# Patient Record
Sex: Female | Born: 1965 | Race: White | Hispanic: No | Marital: Married | Smoking: Never smoker
Health system: Southern US, Community
[De-identification: ages and names within clinical notes are randomized; demographics above are authoritative.]

## PROBLEM LIST (undated history)

## (undated) DIAGNOSIS — G43909 Migraine, unspecified, not intractable, without status migrainosus: Secondary | ICD-10-CM

## (undated) DIAGNOSIS — E039 Hypothyroidism, unspecified: Secondary | ICD-10-CM

## (undated) DIAGNOSIS — I82409 Acute embolism and thrombosis of unspecified deep veins of unspecified lower extremity: Secondary | ICD-10-CM

## (undated) HISTORY — PX: ABDOMINAL HYSTERECTOMY: SHX81

## (undated) HISTORY — DX: Acute embolism and thrombosis of unspecified deep veins of unspecified lower extremity: I82.409

## (undated) HISTORY — DX: Migraine, unspecified, not intractable, without status migrainosus: G43.909

## (undated) HISTORY — PX: ACHILLES TENDON REPAIR: SUR1153

## (undated) HISTORY — PX: LASER ABLATION: SHX1947

---

## 1998-05-09 ENCOUNTER — Other Ambulatory Visit: Admission: RE | Admit: 1998-05-09 | Discharge: 1998-05-09 | Payer: Self-pay | Admitting: Obstetrics and Gynecology

## 1999-03-21 ENCOUNTER — Other Ambulatory Visit: Admission: RE | Admit: 1999-03-21 | Discharge: 1999-03-21 | Payer: Self-pay | Admitting: Obstetrics and Gynecology

## 2000-05-14 HISTORY — PX: ROUX-EN-Y GASTRIC BYPASS: SHX1104

## 2001-01-23 ENCOUNTER — Other Ambulatory Visit: Admission: RE | Admit: 2001-01-23 | Discharge: 2001-01-23 | Payer: Self-pay | Admitting: Obstetrics and Gynecology

## 2002-02-17 ENCOUNTER — Ambulatory Visit (HOSPITAL_COMMUNITY): Admission: RE | Admit: 2002-02-17 | Discharge: 2002-02-17 | Payer: Self-pay | Admitting: Orthopedic Surgery

## 2002-04-21 ENCOUNTER — Ambulatory Visit (HOSPITAL_BASED_OUTPATIENT_CLINIC_OR_DEPARTMENT_OTHER): Admission: RE | Admit: 2002-04-21 | Discharge: 2002-04-21 | Payer: Self-pay | Admitting: Orthopedic Surgery

## 2002-05-21 ENCOUNTER — Inpatient Hospital Stay (HOSPITAL_COMMUNITY): Admission: EM | Admit: 2002-05-21 | Discharge: 2002-05-22 | Payer: Self-pay | Admitting: Emergency Medicine

## 2002-05-22 ENCOUNTER — Encounter: Payer: Self-pay | Admitting: Internal Medicine

## 2003-04-22 ENCOUNTER — Other Ambulatory Visit: Admission: RE | Admit: 2003-04-22 | Discharge: 2003-04-22 | Payer: Self-pay | Admitting: Obstetrics and Gynecology

## 2003-10-07 ENCOUNTER — Ambulatory Visit (HOSPITAL_BASED_OUTPATIENT_CLINIC_OR_DEPARTMENT_OTHER): Admission: RE | Admit: 2003-10-07 | Discharge: 2003-10-07 | Payer: Self-pay | Admitting: Orthopedic Surgery

## 2004-01-21 ENCOUNTER — Encounter: Admission: RE | Admit: 2004-01-21 | Discharge: 2004-04-20 | Payer: Self-pay | Admitting: Surgery

## 2004-02-08 ENCOUNTER — Ambulatory Visit (HOSPITAL_COMMUNITY): Admission: RE | Admit: 2004-02-08 | Discharge: 2004-02-08 | Payer: Self-pay | Admitting: Surgery

## 2004-04-11 ENCOUNTER — Inpatient Hospital Stay (HOSPITAL_COMMUNITY): Admission: RE | Admit: 2004-04-11 | Discharge: 2004-04-13 | Payer: Self-pay | Admitting: Surgery

## 2004-05-22 ENCOUNTER — Encounter: Admission: RE | Admit: 2004-05-22 | Discharge: 2004-08-20 | Payer: Self-pay | Admitting: Surgery

## 2004-09-25 ENCOUNTER — Other Ambulatory Visit: Admission: RE | Admit: 2004-09-25 | Discharge: 2004-09-25 | Payer: Self-pay | Admitting: Obstetrics and Gynecology

## 2004-10-12 ENCOUNTER — Encounter: Admission: RE | Admit: 2004-10-12 | Discharge: 2005-01-10 | Payer: Self-pay | Admitting: Surgery

## 2006-12-12 ENCOUNTER — Inpatient Hospital Stay (HOSPITAL_COMMUNITY): Admission: AD | Admit: 2006-12-12 | Discharge: 2006-12-16 | Payer: Self-pay | Admitting: Obstetrics and Gynecology

## 2007-08-13 ENCOUNTER — Inpatient Hospital Stay (HOSPITAL_COMMUNITY): Admission: RE | Admit: 2007-08-13 | Discharge: 2007-08-15 | Payer: Self-pay | Admitting: Obstetrics and Gynecology

## 2007-08-13 ENCOUNTER — Encounter (INDEPENDENT_AMBULATORY_CARE_PROVIDER_SITE_OTHER): Payer: Self-pay | Admitting: Obstetrics and Gynecology

## 2009-05-26 ENCOUNTER — Ambulatory Visit (HOSPITAL_COMMUNITY): Admission: AD | Admit: 2009-05-26 | Discharge: 2009-05-27 | Payer: Self-pay | Admitting: Surgery

## 2010-07-30 LAB — CBC
HCT: 37.3 % (ref 36.0–46.0)
Hemoglobin: 12.7 g/dL (ref 12.0–15.0)
MCHC: 34.2 g/dL (ref 30.0–36.0)
MCV: 97.1 fL (ref 78.0–100.0)
Platelets: 302 10*3/uL (ref 150–400)
RBC: 3.84 MIL/uL — ABNORMAL LOW (ref 3.87–5.11)
RDW: 12.6 % (ref 11.5–15.5)
WBC: 5.6 10*3/uL (ref 4.0–10.5)

## 2010-09-26 NOTE — Op Note (Signed)
NAME:  Gina Conner, Gina Conner NO.:  [id number]   MEDICAL RECORD NO.:  [id number]          PATIENT TYPE:  AMB   LOCATION:  SDC                           FACILITY:  [facility]   PHYSICIAN:  Malva Limes, M.D.    DATE OF BIRTH:  Sep 02, 1965   DATE OF PROCEDURE:  08/13/2007  DATE OF DISCHARGE:                               OPERATIVE REPORT   PREOPERATIVE DIAGNOSES:  1. Symptomatic uterine prolapse.  2. Stress urinary continence.  3. Rectocele and cystocele.   POSTOPERATIVE DIAGNOSES:  1. Symptomatic uterine prolapse.  2. Stress urinary continence.  3. Rectocele and cystocele.   PROCEDURE:  1. Laparoscopic-assisted vaginal hysterectomy with bilateral salpingo-      oophorectomy.  2. Transvaginal tape urethral sling.  3. Cystoscopy.  4. Anterior posterior colporrhaphy.   SURGEON:  Malva Limes, M.D.   ASSISTANT:  Randye Lobo, M.D.   ANESTHESIA:  General endotracheal.   ANTIBIOTIC:  Ancef and Cipro.   SPECIMENS:  Uterus, cervix, fallopian tubes and ovaries sent to  pathology.   ESTIMATED BLOOD LOSS:  250 mL.   COMPLICATIONS:  None.   PROCEDURE:  The patient was taken to the operating room where general  anesthetic was administered without complications.  She was then placed  in the dorsal lithotomy position and draped in usual fashion for this  procedure.  A Hulka tenaculum was applied to the anterior cervical lip.  Attention was then turned to the umbilicus.  The umbilicus was injected  with 0.25% Marcaine.  A vertical skin incision was made.  The fascia was  grasped, entered with Mayos and a pursestring suture placed.  The  parietal peritoneum was then entered bluntly.  The scope was placed and  the abdomen insufflated with 3 meters of carbon dioxide.  The patient  was then placed in Trendelenburg.  The patient had normal appearing  abdominal cavity.  There was no evidence of any adhesions or  endometriosis.  At this point the left ureter was identified.   The  infundibulopelvic ligament on the left was grasped, cauterized and  transected.  The broad ligament was then cauterized and transected.  Once the uterine vessel was reached, the procedure was stopped on the  side.  A similar procedure was performed on the opposite side.  This  concluded the laparoscopic part of the procedure.  At this point the  team moved to the vagina.  The cervix was injected with 1% lidocaine  with epinephrine.  The posterior cul-de-sac was entered sharply.  The  uterosacral ligaments were bilaterally clamped, cut and ligated with 0  Monocryl suture.  The cervix was circumscribed.  The anterior cul-de-sac  was entered sharply.  The cardinal ligaments were serially clamped, cut  and ligated with 0 Monocryl suture.  The uterine vessels were  bilaterally clamped, cut and ligated with 0 Monocryl suture.  This  specimen was then removed.  All pedicles were checked and found to be  hemostatic.  At this point the posterior cuff was closed using 2-0  Vicryl in a running locking fashion.  At this point Dr. Edward Jolly took over  the case and that part will be dictated by her.  At the conclusion of  her procedure, the abdominal cavity was insufflated again.  Hemostasis  was found.  The instruments were removed.  Pneumoperitoneum released.  The fascia was closed with 0 Vicryl suture in a pursestring fashion.  Skin was closed with 4-0 Vicryl suture in interrupted fashion.  The  ports were closed with Dermabond.  The patient was extubated and taken  to the recovery room in stable condition.  Instrument and lap counts  correct x2.           ______________________________  Malva Limes, M.D.     MA/MEDQ  D:  08/13/2007  T:  08/13/2007  Job:  [id number]

## 2010-09-26 NOTE — Op Note (Signed)
NAME:  Gina Conner, Gina Conner             ACCOUNT NO.:  [id number]   MEDICAL RECORD NO.:  [id number]          PATIENT TYPE:  AMB   LOCATION:  SDC                           [facility]   PHYSICIAN:  Randye Lobo, M.D.   DATE OF BIRTH:  05/24/1965   DATE OF PROCEDURE:  08/13/2007  DATE OF DISCHARGE:                               OPERATIVE REPORT   PREOPERATIVE DIAGNOSES:  1. Incomplete uterovaginal prolapse.  2. Genuine stress incontinence.  3. Family history of ovarian cancer.   POSTOPERATIVE DIAGNOSES:  1. Incomplete uterovaginal prolapse.  2. Genuine stress incontinence.  3. Family history of ovarian cancer.   PROCEDURE:  1. Anterior and posterior colporrhaphy.  2. Tension-free vaginal tape suburethral sling.  3. Cystoscopy.   SURGEON:  Conley Simmonds, MD   ASSISTANT:  Malva Limes, MD   ANESTHESIA:  General endotracheal; local is 0.5% lidocaine with  epinephrine 1:200,000.   ESTIMATED BLOOD LOSS:  150 mL.   URINE OUTPUT:  100 mL.   TOTAL INTRAVENOUS FLUIDS FOR SURGICAL PROCEDURE:  1700 mL   COMPLICATIONS:  None.   INDICATIONS FOR PROCEDURE:  The patient is a 45 year old para 1  Caucasian female who presented to her primary gynecologist, Dr. Malva Limes, with a report of urinary incontinence with stressful  maneuvers.  At that time, Dr. Dareen Piano diagnosed the patient with a  cystocele and a rectocele.  He discussed with the patient her  uterovaginal prolapse and stress incontinence and sent her for further  evaluation by me.   The patient had urodynamic testing in the office which confirmed the  presence of genuine stress incontinence.  The patient was noted to have  a first-degree cystocele and a second-degree rectocele with first-degree  uterine prolapse.   A plan is now made to proceed with a laparoscopically assisted vaginal  hysterectomy and bilateral salpingo-oophorectomy by Dr. Dareen Piano.  The  patient has requested removal of her ovaries due to family  history of  ovarian cancer.  I will be performing an anterior and posterior  colporrhaphy along with a tension-free vaginal tape suburethral sling  and cystoscopy.  Risks, benefits, and alternatives have been reviewed  with the patient, who wishes to proceed.   FINDINGS:  The patient was noted to have a first-degree cystocele and a  second-degree rectocele.   Cystoscopy demonstrated the bladder to be normal throughout 360 degrees  including the bladder dome and trigone.  The ureters were noted to be  patent bilaterally.  There was no evidence of a foreign body in either  the bladder or the urethra.   SPECIMEN:  None.   PROCEDURE:  The patient was reidentified in the preoperative hold area.  She did receive ciprofloxacin IV for antibiotic prophylaxis.   In the operating room, general endotracheal anesthesia was induced and  the patient was placed in the dorsal lithotomy position.  The patient  was sterilely prepped and draped and Dr. Dareen Piano proceeded with a  laparoscopically assisted vaginal hysterectomy.  Please refer to this  dictation separately.   At the end of the hysterectomy, Dr. Dareen Piano closed the posterior  vaginal  cuff with a running-locked suture of 0 Vicryl.  The hysterectomy  pedicles were noted to be hemostatic.   I then performed the remainder of the surgery.   I placed a Foley catheter in the bladder and left it to gravity drainage  throughout the procedure.   I performed a McCall culdoplasty using 0 Vicryl suture.  The suture was  brought through the posterior vaginal cuff and into the cul-de-sac at  the 6 o'clock position.  The suture was then brought down through the  distal left uterosacral ligament, across the posterior cul-de-sac in a  pursestring fashion, through the distal right uterosacral ligament and  then out through the cul-de-sac and into the vagina again at the 6  o'clock position.   The anterior colporrhaphy was performed next.  Allis clamps  were used to  mark the vaginal mucosa along the anterior vaginal wall in the midline.  The mucosa was injected locally with 0.5% lidocaine with 1:200,000 of  epinephrine.  The vaginal mucosa was incised vertically with a  Metzenbaum scissors.  Sharp dissection was used to dissect the bladder  and subvaginal tissue overlying the vaginal mucosa.  The dissection was  carried back to the pubic rami anteriorly and down to the level of the  uterosacral ligaments posteriorly.  Hemostasis during the procedure was  achieved with monopolar cautery.   The TVT sling was performed next in a top-down fashion.  Small puncture-  type incisions were then created suprapubically 2 cm to the right and  left of the midline.  The abdominal needle passer was then placed  through the right suprapubic incision and out through the endopelvic  fascia at the level of the midurethra and lateral to it on the  ipsilateral side.  The same procedure that was performed on the right-  hand side was then repeated on the left-hand side with the other  abdominal needle passer.  The Foley catheter was removed and cystoscopy  was performed and the findings are as noted above.  The bladder was then  drained of cystoscopy fluid and the Foley catheter was replaced.  The  sling was attached to the abdominal needle passers and drawn up through  the suprapubic incisions.  The plastic sheaths were removed while a  Kelly clamp was placed between the urethra and the sling.  Everything  was noted to be in excellent position.  Excess sling was trimmed  suprapubically.   The anterior colporrhaphy was performed with a series of vertical  mattress sutures of 2-0 Vicryl.  The excess vaginal mucosa was trimmed  and the anterior vaginal wall was closed with a running-locked suture of  2-0 Vicryl.  The vaginal cuff was similarly closed with a running-locked  suture of 2-0 Vicryl.  The culdoplasty suture was tied at the very end  of the case,  at which time there was excellent elevation and support of  the vaginal cuff.   The posterior colporrhaphy was performed last.  Allis clamps were again  used to mark the midline of the posterior vaginal mucosa up to a level  of 2 cm below the culdoplasty suture.  The vaginal mucosa and perineal  body were injected locally with 0.5% lidocaine with 1:200,000 of  epinephrine.  A triangular wedge of epithelium was removed from the  perineal body and the posterior vaginal mucosa was incised vertically  with a Metzenbaum scissors.  Sharp dissection was used to be used to  dissect the perirectal fascia off of the  posterior vaginal mucosa  bilaterally.  Hemostasis was again created with monopolar cautery.  Again, a series of vertical mattress sutures were used to reduce the  rectocele.  However, at the top of the rectocele repair, a pursestring  suture of 2-0 Vicryl was placed to begin the posterior colporrhaphy.  The vertical mattress sutures began with 2-0 Vicryl, which then  transitioned to 0 Vicryl at the level of the mid vagina and continued to  the perineal body.  Excess vaginal mucosa was then trimmed and the  posterior vaginal mucosa was closed with a running-locked suture of 2-0  Vicryl.  At the level of the perineal body, two crowns sutures of 0  Vicryl were placed to give extra perineal support.  The suture was  continued along the full perineal body in a subcuticular fashion as for  an episiotomy and the knot was tied at the level of the hymen.   A plain gauze was placed in the vagina for packing.   The suprapubic incisions were closed with Dermabond.   Dr. Dareen Piano completed final laparoscopy for the procedure.  Again,  refer to this dictation separately.   This concluded the patient's procedure.  There were no complications.  All needle, instrument, and sponge counts were correct.  The patient was  escorted to the recovery room in stable and awake condition.      Randye Lobo, M.D.  Electronically Signed     BES/MEDQ  D:  08/13/2007  T:  08/13/2007  Job:  [id number]

## 2010-09-29 NOTE — Discharge Summary (Signed)
NAME:  Gina Conner, Gina Conner             ACCOUNT NO.:  [id number]   MEDICAL RECORD NO.:  [id number]          PATIENT TYPE:  INP   LOCATION:  9303                          [facility]ACILITY:  [facility]   PHYSICIAN:  Randye Lobo, M.D.   DATE OF BIRTH:  01/05/1966   DATE OF ADMISSION:  08/13/2007  DATE OF DISCHARGE:  08/15/2007                               DISCHARGE SUMMARY   ADMISSION DIAGNOSES:  1. Incomplete uterovaginal prolapse.  2. Genuine stress incontinence.  3. Family history of ovarian cancer.   DISCHARGE DIAGNOSES:  1. Incomplete uterovaginal prolapse.  2. Genuine stress incontinence.  3. Family history of ovarian cancer.   SIGNIFICANT OPERATIONS AND PROCEDURES:  The patient underwent a  laparoscopically assisted vaginal hysterectomy with bilateral salpingo-  oophorectomy under the direction of Dr. Malva Limes, and a tension-  free vaginal tape suburethral sling, cystoscopy, anterior and posterior  colporrhaphy along with a McCall culdoplasty under the direction of Dr.  Conley Simmonds, all on August 13, 2007 at the [facility] [place].   ADMISSION HISTORY AND PHYSICAL EXAMINATION:  The patient is a 45-year-  old Caucasian female, a private patient of Dr. Malva Limes, who was  noted to have evidence of incomplete uterovaginal prolapse during an  evaluation for urinary incontinence with stressful maneuvers.  The  patient has a history of a forceps delivery of a macrosomic baby.  Multichannel urodynamic testing in the office has confirmed the presence  of genuine stress incontinence.  The patient has requested removal of  tubes and ovaries at the time of her surgical procedure due to a history  of ovarian cancer in the family.   PAST MEDICAL HISTORY:  Significant for a deep venous thrombosis after  ankle surgery.   Physical exam demonstrates a first-degree cystocele and a second-degree  rectocele.  The uterus was noted to be small and nontender.  There was  no evidence of any  adnexal masses nor tenderness.   HOSPITAL COURSE:  The patient was admitted on August 13, 2007, at which  time she underwent a laparoscopically assisted vaginal hysterectomy with  bilateral salpingo-oophorectomy under the direction of Dr. Malva Limes, and a tension-free vaginal tape, midurethral sling,  cystoscopy, and anterior and posterior colporrhaphy, and a McCall  culdoplasty under the direction of Dr. Conley Simmonds.  The patient's  surgical procedure was uncomplicated.   Postoperatively, the patient was begun on Lovenox for DVT prophylaxis.  She received her first dose in the PACU.  Postoperatively, the patient  did have control of her pain with a morphine PCA.  By postoperative day  #1, the patient was converted over to oral pain medication.  She  received both Percocet and ibuprofen.   The patient began her voiding trials on postoperative day #1.  She had  voids between 75 and 300 mL and postvoid residuals of 600-800 mL.  Her  Foley catheter was therefore replaced on the evening of postoperative  day #1, and she began her voiding trials on postoperative day #2.   The patient tolerated a regular diet during her hospitalization.  She  was able to ambulate  independently.  The patient's discharge hemoglobin  was noted to be 12.1.  The final pathology report from the uterus,  tubes, and ovaries were pending at the time of her discharge.   The patient was found to be in good condition and ready for discharge on  postoperative day #2.   DISCHARGE INSTRUCTIONS:  1. Discharged to home.  2. The patient will take the following medications; Percocet 5 mg/325      mg 1-2 p.o. q.4-6 h. p.r.n. pain, ibuprofen 600 mg p.o. q.6 h.      p.r.n. pain, Colace 100 mg p.o. daily to b.i.d. p.r.n.      constipation, ciprofloxacin 250 mg p.o. b.i.d. x5 days, Lovenox 40      mg subcu daily for a total of 7 days postoperatively (including      postoperative in-hospital Lovenox therapy), and Phenergan  25 mg      p.o. q.6 h. p.r.n. nausea.  3. The patient will have decreased activity at home.  She will not do      any heavy lifting or have sexual activity for 12 weeks.  The      patient will not drive for 2 weeks.  She will not work for 6 weeks.  4. The patient will follow a regular diet.  5. The patient will follow up in the office in 3 days for a voiding      trial, as her catheter was left to gravity drainage at the time of      her discharge.  6. The patient will call if she experiences fever, nausea, and      vomiting, pain uncontrolled by her medication, active vaginal      bleeding, difficulty with her catheter, pain, swelling, or redness      in her extremities, or any other concern.      Randye Lobo, M.D.  Electronically Signed     BES/MEDQ  D:  09/03/2007  T:  09/04/2007  Job:  [id number]

## 2010-09-29 NOTE — Discharge Summary (Signed)
Gina Conner, Gina Conner             ACCOUNT NO.:  [id number]   MEDICAL RECORD NO.:  [id number]          PATIENT TYPE:  INP   LOCATION:  0469                         [facility]ACILITY:  [facility]   PHYSICIAN:  Sandria Bales. Ezzard Standing, M.D.  DATE OF BIRTH:  [birth year]   DATE OF ADMISSION:  04/11/2004  DATE OF DISCHARGE:  04/13/2004                                 DISCHARGE SUMMARY   DISCHARGE DIAGNOSES:  1.  Morbid obesity.  2.  Seasonal allergies.  3.  Chronic back and right ankle pain.  4.  History of deep venous thrombosis.   OPERATION PERFORMED:  The patient had a laparoscopic Roux-en-Y gastric  bypass on the 29th of November, 2005.   HISTORY OF ILLNESS:  [name], patient of Dr.  Neita Conner of [place], [place] who has been morbidly obese much of her adult  life.  She presents to our bariatric program.  She has been to our bariatric  preoperative evaluation which has included labs, nutritional consultation,  and psychiatric evaluation.  She presents with a height of 5 feet 8 inches,  weight of 321 pounds, an initial BMI of 48.5.   PAST MEDICAL HISTORY:  1.  Seasonal allergies.  2.  Chronic back pain.  3.  Underlying ankle pain in part due to right ankle injury.  4.  History of DVT from prior right ankle injury.   She presented to the operating room on the 29th of November, 2005 and  underwent a laparoscopic Roux-en-Y gastric bypass.  Postoperatively, she did  well.  Her hemoglobin was 13, hematocrit 39 on the first postoperative day.  Her white blood cell count was 9800.  She had an upper GI swallow which was  negative and Doppler exams of her lower extremities was negative.  By the  second postoperative day, she was afebrile.  She was ready for discharge.  Her white blood cell count was 8900.  I removed her drain and reviewed her  discharge instructions with her.   She was given Roxicet for pain.  Her activity was as tolerated.  She was to  follow the bariatric diet  which at this time was protein supplement drinks.  She will have appointments to see the nutritionist on the 6th of December,  2005 and see me on the 9th of December, 2005.  Her discharge condition was  good.      DHN/MEDQ  D:  07/03/2004  T:  07/03/2004  Job:  [id number]   cc:   Fara Chute  [address] [place]  [place] [address]  Fax: [phone]

## 2010-09-29 NOTE — Op Note (Signed)
NAME:  Gina Conner, Gina Conner             ACCOUNT NO.:  [id number]   MEDICAL RECORD NO.:  [id number]          PATIENT TYPE:  INP   LOCATION:  0001                         FACILITY:  [facility]   PHYSICIAN:  Thornton Park. Daphine Deutscher, MD  DATE OF BIRTH:  28-Jul-1965   DATE OF PROCEDURE:  04/11/2004  DATE OF DISCHARGE:                                 OPERATIVE REPORT   PREOPERATIVE DIAGNOSIS:  Gastric bypass for morbid obesity.   POSTOPERATIVE DIAGNOSIS:  Gastric bypass for morbid obesity.   PROCEDURE:  Upper endoscopy.   SURGEON:  Thornton Park. Daphine Deutscher, M.D.   DESCRIPTION OF PROCEDURE:  Gina Conner is a 45 year old female  undergoing laparoscopic Roux-Y gastric bypass.  With the patient in the  supine position, I came to the head of the table and removed the Ewald tube.  I passed the upper endoscope without difficulty and under direct vision  passed it down the esophagus.  The esophagogastric junction at the Z line  was at approximately 41 cm, and I entered into a nice narrow pouch.  The  pouch length was approximately 6 cm and ended in a very nice anastomosis.  You could see the outflow, and pictures were taken.  I insufflated, and Dr.  Ezzard Standing put this under water and found no evidence of a leak.  I then  decompressed the stomach and removed the upper endoscope.  The patient  seemed to tolerate this procedure well, and the remainder of the procedure  was performed by Dr. Ezzard Standing.     Matt   MBM/MEDQ  D:  04/11/2004  T:  04/11/2004  Job:  [id number]   cc:   Sandria Bales. Ezzard Standing, M.D.  [address], [address]  [place]  [place] [address]  Fax: [phone]

## 2010-09-29 NOTE — Op Note (Signed)
NAME:  Gina Conner, Gina Conner                         ACCOUNT NO.:  [id number]   MEDICAL RECORD NO.:  [id number]                   PATIENT TYPE:  AMB   LOCATION:  DSC                                  FACILITY:  [facility]   PHYSICIAN:  Katy Fitch. Naaman Plummer., M.D.          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   OPERATION:  Release of left first dorsal compartment with resection of  septum and tenolysis and synovectomy of extensor pollicis brevis tendon.   SURGEON:  Katy Fitch. Sypher, M.D.   ASSISTANT:  Jonni Sanger, P.A.   ANESTHESIA:  General by LMA.   ANESTHESIOLOGIST:  Burna Forts, M.D.   INDICATIONS FOR PROCEDURE:  Gina Conner is a well known patient followed  for more than five years for stenosing tenosynovitis and other upper  extremity orthopedic predicaments.  She is status post release of her right  first dorsal compartment with a good result.  During the spring of 2005, she  developed stenosing tenosynovitis of the left first dorsal compartment and  after a trial of splinting and steroid injection x 2, she has been unable to  resolve this predicament.  After informed consent, she is brought to the  operating room at this time for release of her left first dorsal  compartment.   PROCEDURE:  Gina Conner is brought to the operating room and placed on  supine position on the operating table.  Following induction of general  anesthesia by LMA, the left arm was prepped with Betadine solution and  sterilely draped.  Following application of a pneumatic tourniquet to the  proximal brachium, the arm was exsanguinated with an Esmarch bandage and the  tourniquet was inflated to 230 mmHg.  The  procedure commenced with a short  transverse incision over the palpably enlarged first dorsal compartment.  The subcutaneous tissues were carefully divided taking care to gently  identify and retract the radial sensory branches.  The compartment was  markedly thickened and calcified.  The compartment was incised  longitudinally revealing two slips of the adductor pollicis longus that were  moderately swollen.  A second dorsal compartment was identified and opened.  The extensor pollicis brevis had thick cartilaginous tissue growing proximal  to the compartment.  This was causing triggering of the extensor pollicis  brevis as it passed through the accessory compartment.  The septum between  the dorsal and palmar compartments was resected with scissors.  The tendons  were placed in their anatomic position.  There was still a sense of  triggering, therefore, a synovectomy of the extensor pollicis brevis was  accomplished with a rongeur.  Thereafter, the wound was irrigated.  The  tendons were replaced in  their anatomic position and the skin repaired with  intradermal 3-0 Prolene suture.  Steri-Strips were applied followed by a  soft dressing of Xeroflow, sterile gauze, and Ace wrap.   We will encourage Gina Conner to move her wrist immediately.  There were no  apparent complications.  For aftercare, she is given a prescription for  Darvocet N100, 1 p.o. q.4-6h. p.r.n. pain, 20 tablets without refill.                                               Katy Fitch Naaman Plummer., M.D.    RVS/MEDQ  D:  10/07/2003  T:  10/07/2003  Job:  [id number]

## 2010-09-29 NOTE — Op Note (Signed)
NAME:  Gina Conner, Gina Conner             ACCOUNT NO.:  [id number]   MEDICAL RECORD NO.:  [id number]          PATIENT TYPE:  INP   LOCATION:  0001                         FACILITY:  [facility]   PHYSICIAN:  Sandria Bales. Ezzard Standing, M.D.  DATE OF BIRTH:  Nov 08, 1965   DATE OF PROCEDURE:  04/10/2004  DATE OF DISCHARGE:                                 OPERATIVE REPORT   PREOPERATIVE DIAGNOSIS:  Morbid obesity with a BMI of 48.5.   POSTOPERATIVE DIAGNOSIS:  Morbid obesity with a body mass index of 48.5.   PROCEDURE:  Laparoscopic Roux-en-Y gastrojejunostomy.   SURGEON:  Sandria Bales. Ezzard Standing, M.D.   FIRST ASSISTANT:  Thornton Park. Daphine Deutscher, M.D.   ANESTHESIA:  General endotracheal.   ESTIMATED BLOOD LOSS:  Minimal.   INDICATIONS FOR PROCEDURE:  Ms. Minogue is a 45 year old white female who has  been overweight most of her adult life.  She has been through a bariatric  program. She has a weight of 321 pounds with a BMI of 48.5.  She understands  the indications and potential risks of bariatric surgery.  Potential risks  include, but not limited to bleeding, infection, bowel leak, DVT, long-term  nutritional consequences and the possibility of open surgery.  Preoperatively she was given antibiotics and subcutaneous Lovenox.  She had  PS stockings in place.   DESCRIPTION OF PROCEDURE:  Her abdomen was prepped with Betadine solution  and sterilely draped. I entered the abdomen with a 12 mm Opti-Vu Ethicon  trocar in the left upper quadrant.  I ended up putting in 7 trocars; 5  across the upper mid-abdomen, 4 of which were 12s and 1 was a 5.  I put a 10-  11 below and to the right of her umbilicus and then a 5 mm subxiphoid for  the liver retractor.   Abdominal exploration revealed the stomach, both lobes of the liver and the  bowel which could be seen, were all unremarkable.  I first turned my  attention to the small bowel.  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  I went 40 cm downstream and divided the small bowel with  a white load of the  45 Endo-GIA stapler.  I then marked the gastric limb with a Penrose drain,  counted 100 cm and did a side-to-side stapled enteroenterostomy. This was  again with the 45 white staple load of Ethicon staples.  I then closed the  enterotomy with 2 running #1 2-0 Vicryl sutures.  I then closed the  mesenteric defect with a 2-0 silk suture on a Lapra-Ty.  I then covered the  jejunojejunostomy with a Tisseel.   I then placed the patient in a reverse Trendelenburg position using a  Nathanson retractor in the upper abdomen to retract the left lobe of the  liver.  I then identified the esophagogastric junction and first developed  the angle of His along the left crus.  After doing this, I went down the  lesser curvature approximately 5 cm to where the second vessel that entered  the lesser curve of the stomach.  I went into the lesser sac in this  direction.  First I used the 45 blue load to fire across the stomach,  through the lesser curvature. Then I used 3 loads of the 60 mm trocar to  complete the stomach pouch. She did have a little bit of bleeding on the new  pouch side up towards the angle of His.  I used at least 3 staples along  this to stop the bleeding.   I then oversewed the gastric remnant with a locking 2-0 Vicryl suture using  Lapra-Tys on both ends.  I then placed Tisseel along the new gastric pouch  and upper edges of the gastric remnant using about 1 cc. I then did an  antecolic, antegastric anastomosis with the limb of the jejunum up to the  gastric wall.  I used Ewald to got into the stomach.  I sewed the jejunum to  the stomach using a running 2-0 Vicryl suture.  I then did the enterotomy  with a 45-Endo GIA-stapler which was a blue load, and then closed the  gastrojejunostomy with 2 running 2-0 Vicryl sutures.   I then closed the anterior layer of the gastrojejunostomy with a running 2-0  Vicryl suture.  I did pass the Ewald down through the  anastomosis to make  sure this was lined up well.  The Maricela Curet was removed.   Dr. Daphine Deutscher then came to the head of the bed and did an upper endoscopy while  I clamped the jejunum in the abdomen. I flooded the upper abdomen with  saline. When he endoscoped the patient, there was no evidence of any air  leak.  He identified the GE-junction at about 41 cm.  The gastrojejunostomy  at about 46-47 cm for about a 5-6 cm pouch. There was no bleeding from the  pouch, no evidence of air leak and again, the anastomosis was widely patent.  He withdrawn the endoscope, and he will dictate this portion of the  operation.   I then placed a blake drain in the upper abdomen up over the GE-junction.  I  sewed this in using a 3-0 nylon suture.  I used about another 2 cc of  Tisseel over the anterior surface of the gastrojejunostomy and the cut end  of the jejunum.  I then removed the [facility] retractor, I removed all the  trocars under direct visualization.  There was no bleeding at any trocar  site.  I closed the skin with a skin staple.  The patient tolerated the  procedure well. Each wound was dressed.   The patient was transferred to the recovery room in good condition.  Sponge  and needle counts were correct at the end of the case.     Pervis Hocking   DHN/MEDQ  D:  04/11/2004  T:  04/11/2004  Job:  [id number]   cc:   Malva Limes, M.D.  [address], [address]  [place]  [place] [address]  Fax: [phone]   Fara Chute  [address] [place]  [place] [address]  Fax: [phone]

## 2011-02-05 LAB — CBC
HCT: 40.7
Hemoglobin: 14.3
MCHC: 35.1
MCV: 96
Platelets: 352
RBC: 4.24
RDW: 11.4 — ABNORMAL LOW
WBC: 4.8

## 2011-02-05 LAB — PREGNANCY, URINE: Preg Test, Ur: NEGATIVE

## 2011-02-06 LAB — CBC
HCT: 34.7 — ABNORMAL LOW
Hemoglobin: 12.1
MCHC: 34.8
MCV: 97.2
Platelets: 293
RBC: 3.57 — ABNORMAL LOW
RDW: 11.3 — ABNORMAL LOW
WBC: 7.2

## 2011-02-26 LAB — RPR: RPR Ser Ql: NONREACTIVE

## 2011-02-26 LAB — CBC
HCT: 33.4 — ABNORMAL LOW
HCT: 39.7
HCT: 39.3
Hemoglobin: 11.7 — ABNORMAL LOW
Hemoglobin: 13.6
Hemoglobin: 13.6
MCHC: 34.2
MCHC: 35.1
MCHC: 34.5
MCV: 101 — ABNORMAL HIGH
MCV: 102 — ABNORMAL HIGH
MCV: 101.7 — ABNORMAL HIGH
Platelets: 182
Platelets: 185
Platelets: 186
RBC: 3.31 — ABNORMAL LOW
RBC: 3.89
RBC: 3.86 — ABNORMAL LOW
RDW: 12.1
RDW: 12.5
RDW: 12.1
WBC: 16.7 — ABNORMAL HIGH
WBC: 21.2 — ABNORMAL HIGH
WBC: 8.4

## 2011-02-26 LAB — APTT: aPTT: 33

## 2011-02-26 LAB — DIFFERENTIAL
Basophils Absolute: 0
Basophils Relative: 0
Eosinophils Absolute: 0
Eosinophils Relative: 0
Lymphocytes Relative: 10 — ABNORMAL LOW
Lymphs Abs: 2.1
Monocytes Absolute: 1.4 — ABNORMAL HIGH
Monocytes Relative: 7
Neutro Abs: 17.8 — ABNORMAL HIGH
Neutrophils Relative %: 84 — ABNORMAL HIGH

## 2011-02-26 LAB — PROTIME-INR
INR: 1
Prothrombin Time: 12.8

## 2011-02-26 LAB — CCBB MATERNAL DONOR DRAW

## 2015-01-03 ENCOUNTER — Other Ambulatory Visit: Payer: Self-pay | Admitting: Obstetrics & Gynecology

## 2015-01-04 LAB — CYTOLOGY - PAP

## 2017-01-17 ENCOUNTER — Ambulatory Visit: Payer: Self-pay | Admitting: Orthopedic Surgery

## 2017-01-28 NOTE — Patient Instructions (Signed)
MAXWELL MARTORANO  01/28/2017   Your procedure is scheduled on: 02-06-17  Report to [facility] Main  Entrance Report to Admitting at 2:30 PM   Call this number if you have problems the morning of surgery  [phone]   Remember: ONLY 1 PERSON MAY GO WITH YOU TO SHORT STAY TO GET  READY MORNING OF YOUR SURGERY.  Do not eat food or drink liquids :After Midnight. You may have a Clear Liquid Diet from Midnight until 11:00AM. After 11:00AM, nothing by mouth until after surgery.     CLEAR LIQUID DIET   Foods Allowed                                                                     Foods Excluded  Coffee and tea, regular and decaf                             liquids that you cannot  Plain Jell-O in any flavor                                             see through such as: Fruit ices (not with fruit pulp)                                     milk, soups, orange juice  Iced Popsicles                                    All solid food Carbonated beverages, regular and diet                                    Cranberry, grape and apple juices Sports drinks like Gatorade Lightly seasoned clear broth or consume(fat free) Sugar, honey syrup  Sample Menu Breakfast                                Lunch                                     Supper Cranberry juice                    Beef broth                            Chicken broth Jell-O                                     Grape juice  Apple juice Coffee or tea                        Jell-O                                      Popsicle                                                Coffee or tea                        Coffee or tea  _____________________________________________________________________     Take these medicines the morning of surgery with A SIP OF WATER: Levothyroxine (Synthroid)                                You may not have any metal on your body including hair pins and            piercings  Do not wear jewelry, make-up, lotions, powders or perfumes, deodorant             Do not wear nail polish.  Do not shave  48 hours prior to surgery.               Do not bring valuables to the hospital. [place].  Contacts, dentures or bridgework may not be worn into surgery.  Leave suitcase in the car. After surgery it may be brought to your room.                 Please read over the following fact sheets you were given: _____________________________________________________________________             [facility] - Preparing for Surgery Before surgery, you can play an important role.  Because skin is not sterile, your skin needs to be as free of germs as possible.  You can reduce the number of germs on your skin by washing with CHG (chlorahexidine gluconate) soap before surgery.  CHG is an antiseptic cleaner which kills germs and bonds with the skin to continue killing germs even after washing. Please DO NOT use if you have an allergy to CHG or antibacterial soaps.  If your skin becomes reddened/irritated stop using the CHG and inform your nurse when you arrive at Short Stay. Do not shave (including legs and underarms) for at least 48 hours prior to the first CHG shower.  You may shave your face/neck. Please follow these instructions carefully:  1.  Shower with CHG Soap the night before surgery and the  morning of Surgery.  2.  If you choose to wash your hair, wash your hair first as usual with your  normal  shampoo.  3.  After you shampoo, rinse your hair and body thoroughly to remove the  shampoo.                           4.  Use CHG as you would any other liquid soap.  You can apply chg directly  to  the skin and wash                       Gently with a scrungie or clean washcloth.  5.  Apply the CHG Soap to your body ONLY FROM THE NECK DOWN.   Do not use on face/ open                           Wound or open sores.  Avoid contact with eyes, ears mouth and genitals (private parts).                       Wash face,  Genitals (private parts) with your normal soap.             6.  Wash thoroughly, paying special attention to the area where your surgery  will be performed.  7.  Thoroughly rinse your body with warm water from the neck down.  8.  DO NOT shower/wash with your normal soap after using and rinsing off  the CHG Soap.                9.  Pat yourself dry with a clean towel.            10.  Wear clean pajamas.            11.  Place clean sheets on your bed the night of your first shower and do not  sleep with pets. Day of Surgery : Do not apply any lotions/deodorants the morning of surgery.  Please wear clean clothes to the hospital/surgery center.  FAILURE TO FOLLOW THESE INSTRUCTIONS MAY RESULT IN THE CANCELLATION OF YOUR SURGERY PATIENT SIGNATURE_________________________________  NURSE SIGNATURE__________________________________  ________________________________________________________________________   Adam Phenix  An incentive spirometer is a tool that can help keep your lungs clear and active. This tool measures how well you are filling your lungs with each breath. Taking long deep breaths may help reverse or decrease the chance of developing breathing (pulmonary) problems (especially infection) following:  A long period of time when you are unable to move or be active. BEFORE THE PROCEDURE   If the spirometer includes an indicator to show your best effort, your nurse or respiratory therapist will set it to a desired goal.  If possible, sit up straight or lean slightly forward. Try not to slouch.  Hold the incentive spirometer in an upright position. INSTRUCTIONS FOR USE  1. Sit on the edge of your bed if possible, or sit up as far as you can in bed or on a chair. 2. Hold the incentive spirometer in an upright position. 3. Breathe out normally. 4. Place the mouthpiece in your  mouth and seal your lips tightly around it. 5. Breathe in slowly and as deeply as possible, raising the piston or the ball toward the top of the column. 6. Hold your breath for 3-5 seconds or for as long as possible. Allow the piston or ball to fall to the bottom of the column. 7. Remove the mouthpiece from your mouth and breathe out normally. 8. Rest for a few seconds and repeat Steps 1 through 7 at least 10 times every 1-2 hours when you are awake. Take your time and take a few normal breaths between deep breaths. 9. The spirometer may include an indicator to show your best effort. Use the indicator as a goal to work toward during each repetition. 10. After each  set of 10 deep breaths, practice coughing to be sure your lungs are clear. If you have an incision (the cut made at the time of surgery), support your incision when coughing by placing a pillow or rolled up towels firmly against it. Once you are able to get out of bed, walk around indoors and cough well. You may stop using the incentive spirometer when instructed by your caregiver.  RISKS AND COMPLICATIONS  Take your time so you do not get dizzy or light-headed.  If you are in pain, you may need to take or ask for pain medication before doing incentive spirometry. It is harder to take a deep breath if you are having pain. AFTER USE  Rest and breathe slowly and easily.  It can be helpful to keep track of a log of your progress. Your caregiver can provide you with a simple table to help with this. If you are using the spirometer at home, follow these instructions: [place] IF:   You are having difficultly using the spirometer.  You have trouble using the spirometer as often as instructed.  Your pain medication is not giving enough relief while using the spirometer.  You develop fever of 100.5 F (38.1 C) or higher. SEEK IMMEDIATE MEDICAL CARE IF:   You cough up bloody sputum that had not been present before.  You  develop fever of 102 F (38.9 C) or greater.  You develop worsening pain at or near the incision site. MAKE SURE YOU:   Understand these instructions.  Will watch your condition.  Will get help right away if you are not doing well or get worse. Document Released: 09/10/2006 Document Revised: 07/23/2011 Document Reviewed: 11/11/2006 [facility] Patient Information 2014 [place], [place].   ________________________________________________________________________

## 2017-01-30 ENCOUNTER — Encounter (HOSPITAL_COMMUNITY)
Admission: RE | Admit: 2017-01-30 | Discharge: 2017-01-30 | Disposition: A | Discharge status: Home / Self Care | Payer: Commercial Managed Care - PPO | Source: Ambulatory Visit | Attending: Orthopedic Surgery | Admitting: Orthopedic Surgery

## 2017-01-30 ENCOUNTER — Encounter (HOSPITAL_COMMUNITY): Payer: Self-pay | Admitting: *Deleted

## 2017-01-30 DIAGNOSIS — M7061 Trochanteric bursitis, right hip: Secondary | ICD-10-CM | POA: Diagnosis not present

## 2017-01-30 DIAGNOSIS — Z01812 Encounter for preprocedural laboratory examination: Secondary | ICD-10-CM | POA: Insufficient documentation

## 2017-01-30 HISTORY — DX: Hypothyroidism, unspecified: E03.9

## 2017-01-30 LAB — CBC
HCT: 43.3 % (ref 36.0–46.0)
Hemoglobin: 14.9 g/dL (ref 12.0–15.0)
MCH: 32.5 pg (ref 26.0–34.0)
MCHC: 34.4 g/dL (ref 30.0–36.0)
MCV: 94.5 fL (ref 78.0–100.0)
Platelets: 328 10*3/uL (ref 150–400)
RBC: 4.58 MIL/uL (ref 3.87–5.11)
RDW: 12.5 % (ref 11.5–15.5)
WBC: 7.1 10*3/uL (ref 4.0–10.5)

## 2017-02-05 MED ORDER — DEXTROSE 5 % IV SOLN
3.0000 g | INTRAVENOUS | Status: AC
  Administered 2017-02-06: 3 g via INTRAVENOUS
  Filled 2017-02-05: qty 3000

## 2017-02-06 ENCOUNTER — Ambulatory Visit (HOSPITAL_COMMUNITY): Payer: Commercial Managed Care - PPO | Admitting: Anesthesiology

## 2017-02-06 ENCOUNTER — Encounter (HOSPITAL_COMMUNITY)
Admission: RE | Disposition: A | Discharge status: Home / Self Care | Payer: Self-pay | Source: Ambulatory Visit | Attending: Orthopedic Surgery

## 2017-02-06 ENCOUNTER — Observation Stay (HOSPITAL_COMMUNITY)
Admission: RE | Admit: 2017-02-06 | Discharge: 2017-02-07 | Disposition: A | Discharge status: Home / Self Care | Payer: Commercial Managed Care - PPO | Source: Ambulatory Visit | Attending: Orthopedic Surgery | Admitting: Orthopedic Surgery

## 2017-02-06 ENCOUNTER — Encounter (HOSPITAL_COMMUNITY): Payer: Self-pay | Admitting: Anesthesiology

## 2017-02-06 DIAGNOSIS — M24051 Loose body in right hip: Secondary | ICD-10-CM | POA: Insufficient documentation

## 2017-02-06 DIAGNOSIS — M7601 Gluteal tendinitis, right hip: Secondary | ICD-10-CM | POA: Diagnosis not present

## 2017-02-06 DIAGNOSIS — E039 Hypothyroidism, unspecified: Secondary | ICD-10-CM | POA: Insufficient documentation

## 2017-02-06 DIAGNOSIS — Z90710 Acquired absence of both cervix and uterus: Secondary | ICD-10-CM | POA: Diagnosis not present

## 2017-02-06 DIAGNOSIS — M7071 Other bursitis of hip, right hip: Secondary | ICD-10-CM | POA: Diagnosis not present

## 2017-02-06 DIAGNOSIS — Z7982 Long term (current) use of aspirin: Secondary | ICD-10-CM | POA: Diagnosis not present

## 2017-02-06 DIAGNOSIS — M67951 Unspecified disorder of synovium and tendon, right thigh: Secondary | ICD-10-CM | POA: Diagnosis present

## 2017-02-06 DIAGNOSIS — Z79899 Other long term (current) drug therapy: Secondary | ICD-10-CM | POA: Diagnosis not present

## 2017-02-06 DIAGNOSIS — Z9884 Bariatric surgery status: Secondary | ICD-10-CM | POA: Insufficient documentation

## 2017-02-06 DIAGNOSIS — M6798 Unspecified disorder of synovium and tendon, other site: Secondary | ICD-10-CM | POA: Diagnosis present

## 2017-02-06 HISTORY — PX: EXCISION/RELEASE BURSA HIP: SHX5014

## 2017-02-06 SURGERY — RELEASE, BURSA, TROCHANTERIC
Anesthesia: General | Site: Hip | Laterality: Right

## 2017-02-06 MED ORDER — FENTANYL CITRATE (PF) 250 MCG/5ML IJ SOLN
INTRAMUSCULAR | Status: AC
  Filled 2017-02-06: qty 5

## 2017-02-06 MED ORDER — PROPOFOL 10 MG/ML IV BOLUS
INTRAVENOUS | Status: AC
  Filled 2017-02-06: qty 20

## 2017-02-06 MED ORDER — ONDANSETRON HCL 4 MG/2ML IJ SOLN
INTRAMUSCULAR | Status: AC
  Filled 2017-02-06: qty 2

## 2017-02-06 MED ORDER — CEFAZOLIN SODIUM-DEXTROSE 2-4 GM/100ML-% IV SOLN
2.0000 g | Freq: Four times a day (QID) | INTRAVENOUS | Status: AC
  Administered 2017-02-06 – 2017-02-07 (×3): 2 g via INTRAVENOUS
  Filled 2017-02-06 (×3): qty 100

## 2017-02-06 MED ORDER — DEXAMETHASONE SODIUM PHOSPHATE 10 MG/ML IJ SOLN
10.0000 mg | Freq: Once | INTRAMUSCULAR | Status: AC
  Administered 2017-02-06: 10 mg via INTRAVENOUS

## 2017-02-06 MED ORDER — SUGAMMADEX SODIUM 500 MG/5ML IV SOLN
INTRAVENOUS | Status: AC
Start: 2017-02-06 — End: 2017-02-06
  Filled 2017-02-06: qty 5

## 2017-02-06 MED ORDER — PHENYLEPHRINE 40 MCG/ML (10ML) SYRINGE FOR IV PUSH (FOR BLOOD PRESSURE SUPPORT)
PREFILLED_SYRINGE | INTRAVENOUS | Status: DC | PRN
  Administered 2017-02-06: 80 ug via INTRAVENOUS

## 2017-02-06 MED ORDER — SODIUM CHLORIDE 0.9 % IJ SOLN
INTRAMUSCULAR | Status: DC | PRN
  Administered 2017-02-06: 30 mL

## 2017-02-06 MED ORDER — HYDROMORPHONE HCL 1 MG/ML IJ SOLN
INTRAMUSCULAR | Status: DC | PRN
  Administered 2017-02-06 (×2): 1 mg via INTRAVENOUS

## 2017-02-06 MED ORDER — ACETAMINOPHEN 10 MG/ML IV SOLN
1000.0000 mg | Freq: Once | INTRAVENOUS | Status: AC
  Administered 2017-02-06: 1000 mg via INTRAVENOUS

## 2017-02-06 MED ORDER — FENTANYL CITRATE (PF) 100 MCG/2ML IJ SOLN
INTRAMUSCULAR | Status: DC | PRN
  Administered 2017-02-06 (×3): 50 ug via INTRAVENOUS
  Administered 2017-02-06: 100 ug via INTRAVENOUS

## 2017-02-06 MED ORDER — BUPIVACAINE HCL 0.25 % IJ SOLN
INTRAMUSCULAR | Status: AC
  Filled 2017-02-06: qty 1

## 2017-02-06 MED ORDER — MIDAZOLAM HCL 5 MG/5ML IJ SOLN
INTRAMUSCULAR | Status: DC | PRN
  Administered 2017-02-06: 2 mg via INTRAVENOUS

## 2017-02-06 MED ORDER — METOCLOPRAMIDE HCL 5 MG PO TABS: 5.0000 mg | ORAL_TABLET | Freq: Three times a day (TID) | ORAL | Status: DC | PRN

## 2017-02-06 MED ORDER — CHLORHEXIDINE GLUCONATE 4 % EX LIQD: 60.0000 mL | Freq: Once | CUTANEOUS | Status: DC

## 2017-02-06 MED ORDER — ONDANSETRON HCL 4 MG/2ML IJ SOLN: 4.0000 mg | Freq: Once | INTRAMUSCULAR | Status: DC | PRN

## 2017-02-06 MED ORDER — BUPIVACAINE LIPOSOME 1.3 % IJ SUSP
INTRAMUSCULAR | Status: DC | PRN
  Administered 2017-02-06: 20 mL

## 2017-02-06 MED ORDER — SODIUM CHLORIDE 0.9 % IJ SOLN
INTRAMUSCULAR | Status: AC
  Filled 2017-02-06: qty 50

## 2017-02-06 MED ORDER — PROPOFOL 10 MG/ML IV BOLUS
INTRAVENOUS | Status: DC | PRN
  Administered 2017-02-06: 200 mg via INTRAVENOUS

## 2017-02-06 MED ORDER — LACTATED RINGERS IV SOLN
INTRAVENOUS | Status: DC
  Administered 2017-02-06: 15:00:00 via INTRAVENOUS

## 2017-02-06 MED ORDER — METOCLOPRAMIDE HCL 5 MG/ML IJ SOLN: 5.0000 mg | Freq: Three times a day (TID) | INTRAMUSCULAR | Status: DC | PRN

## 2017-02-06 MED ORDER — METHOCARBAMOL 500 MG PO TABS
500.0000 mg | ORAL_TABLET | Freq: Four times a day (QID) | ORAL | Status: DC | PRN
  Administered 2017-02-07: 500 mg via ORAL
  Filled 2017-02-06: qty 1

## 2017-02-06 MED ORDER — LEVOTHYROXINE SODIUM 75 MCG PO TABS
150.0000 ug | ORAL_TABLET | Freq: Every day | ORAL | Status: DC
  Administered 2017-02-07: 09:00:00 150 ug via ORAL
  Filled 2017-02-06: qty 2

## 2017-02-06 MED ORDER — BUPIVACAINE LIPOSOME 1.3 % IJ SUSP
20.0000 mL | Freq: Once | INTRAMUSCULAR | Status: DC
  Filled 2017-02-06: qty 20

## 2017-02-06 MED ORDER — MEPERIDINE HCL 50 MG/ML IJ SOLN: 6.2500 mg | INTRAMUSCULAR | Status: DC | PRN

## 2017-02-06 MED ORDER — HYDROMORPHONE HCL 2 MG/ML IJ SOLN
INTRAMUSCULAR | Status: AC
  Filled 2017-02-06: qty 1

## 2017-02-06 MED ORDER — POVIDONE-IODINE 10 % EX SWAB
2.0000 "application " | Freq: Once | CUTANEOUS | Status: AC
  Administered 2017-02-06: 2 via TOPICAL

## 2017-02-06 MED ORDER — LIDOCAINE 2% (20 MG/ML) 5 ML SYRINGE
INTRAMUSCULAR | Status: AC
Start: 2017-02-06 — End: 2017-02-06
  Filled 2017-02-06: qty 5

## 2017-02-06 MED ORDER — ROCURONIUM BROMIDE 50 MG/5ML IV SOSY
PREFILLED_SYRINGE | INTRAVENOUS | Status: AC
  Filled 2017-02-06: qty 5

## 2017-02-06 MED ORDER — ONDANSETRON HCL 4 MG PO TABS
4.0000 mg | ORAL_TABLET | Freq: Four times a day (QID) | ORAL | Status: DC | PRN
  Administered 2017-02-07 (×2): 4 mg via ORAL
  Filled 2017-02-06 (×2): qty 1

## 2017-02-06 MED ORDER — ROCURONIUM BROMIDE 10 MG/ML (PF) SYRINGE
PREFILLED_SYRINGE | INTRAVENOUS | Status: DC | PRN
  Administered 2017-02-06: 50 mg via INTRAVENOUS

## 2017-02-06 MED ORDER — SODIUM CHLORIDE 0.9 % IV SOLN
INTRAVENOUS | Status: DC
  Administered 2017-02-06: 21:00:00 via INTRAVENOUS

## 2017-02-06 MED ORDER — OXYCODONE HCL 5 MG PO TABS
5.0000 mg | ORAL_TABLET | ORAL | Status: DC | PRN
  Administered 2017-02-06: 5 mg via ORAL
  Administered 2017-02-07 (×2): 10 mg via ORAL
  Filled 2017-02-06 (×2): qty 2
  Filled 2017-02-06: qty 1

## 2017-02-06 MED ORDER — SUGAMMADEX SODIUM 200 MG/2ML IV SOLN
INTRAVENOUS | Status: DC | PRN
  Administered 2017-02-06: 260 mg via INTRAVENOUS

## 2017-02-06 MED ORDER — ACETAMINOPHEN 10 MG/ML IV SOLN
INTRAVENOUS | Status: AC
  Filled 2017-02-06: qty 100

## 2017-02-06 MED ORDER — ONDANSETRON HCL 4 MG/2ML IJ SOLN: 4.0000 mg | Freq: Four times a day (QID) | INTRAMUSCULAR | Status: DC | PRN

## 2017-02-06 MED ORDER — MIDAZOLAM HCL 2 MG/2ML IJ SOLN
INTRAMUSCULAR | Status: AC
  Filled 2017-02-06: qty 2

## 2017-02-06 MED ORDER — LIDOCAINE 2% (20 MG/ML) 5 ML SYRINGE
INTRAMUSCULAR | Status: DC | PRN
  Administered 2017-02-06: 50 mg via INTRAVENOUS

## 2017-02-06 MED ORDER — ENOXAPARIN SODIUM 40 MG/0.4ML ~~LOC~~ SOLN
40.0000 mg | SUBCUTANEOUS | Status: DC
  Administered 2017-02-07: 09:00:00 40 mg via SUBCUTANEOUS
  Filled 2017-02-06: qty 0.4

## 2017-02-06 MED ORDER — MORPHINE SULFATE (PF) 4 MG/ML IV SOLN: 1.0000 mg | INTRAVENOUS | Status: DC | PRN

## 2017-02-06 MED ORDER — HYDROMORPHONE HCL-NACL 0.5-0.9 MG/ML-% IV SOSY
0.2500 mg | PREFILLED_SYRINGE | INTRAVENOUS | Status: DC | PRN
  Administered 2017-02-06 (×4): 0.5 mg via INTRAVENOUS

## 2017-02-06 MED ORDER — DEXAMETHASONE SODIUM PHOSPHATE 10 MG/ML IJ SOLN
INTRAMUSCULAR | Status: AC
Start: 2017-02-06 — End: 2017-02-06
  Filled 2017-02-06: qty 1

## 2017-02-06 MED ORDER — METHOCARBAMOL 1000 MG/10ML IJ SOLN
500.0000 mg | Freq: Four times a day (QID) | INTRAVENOUS | Status: DC | PRN
  Administered 2017-02-06: 500 mg via INTRAVENOUS
  Filled 2017-02-06: qty 550

## 2017-02-06 MED ORDER — HYDROMORPHONE HCL-NACL 0.5-0.9 MG/ML-% IV SOSY
PREFILLED_SYRINGE | INTRAVENOUS | Status: AC
  Filled 2017-02-06: qty 4

## 2017-02-06 MED ORDER — ACETAMINOPHEN 500 MG PO TABS
1000.0000 mg | ORAL_TABLET | Freq: Four times a day (QID) | ORAL | Status: DC
  Administered 2017-02-06 – 2017-02-07 (×3): 1000 mg via ORAL
  Filled 2017-02-06 (×3): qty 2

## 2017-02-06 SURGICAL SUPPLY — 44 items
BAG SPEC THK2 15X12 ZIP CLS (MISCELLANEOUS)
BAG ZIPLOCK 12X15 (MISCELLANEOUS) IMPLANT
BIT DRILL 2.4X128 (BIT) IMPLANT
BLADE EXTENDED COATED 6.5IN (ELECTRODE) ×2 IMPLANT
COVER SURGICAL LIGHT HANDLE (MISCELLANEOUS) ×2 IMPLANT
DRAPE INCISE IOBAN 66X45 STRL (DRAPES) ×2 IMPLANT
DRAPE ORTHO SPLIT 77X108 STRL (DRAPES) ×4
DRAPE POUCH INSTRU U-SHP 10X18 (DRAPES) ×2 IMPLANT
DRAPE SURG ORHT 6 SPLT 77X108 (DRAPES) ×2 IMPLANT
DRAPE U-SHAPE 47X51 STRL (DRAPES) ×2 IMPLANT
DRSG ADAPTIC 3X8 NADH LF (GAUZE/BANDAGES/DRESSINGS) ×2 IMPLANT
DRSG MEPILEX BORDER 4X4 (GAUZE/BANDAGES/DRESSINGS) ×1 IMPLANT
DRSG MEPILEX BORDER 4X8 (GAUZE/BANDAGES/DRESSINGS) ×2 IMPLANT
DURAPREP 26ML APPLICATOR (WOUND CARE) ×2 IMPLANT
ELECT REM PT RETURN 15FT ADLT (MISCELLANEOUS) ×2 IMPLANT
EVACUATOR 1/8 PVC DRAIN (DRAIN) ×1 IMPLANT
GAUZE SPONGE 4X4 12PLY STRL (GAUZE/BANDAGES/DRESSINGS) ×2 IMPLANT
GLOVE BIO SURGEON STRL SZ7.5 (GLOVE) ×2 IMPLANT
GLOVE BIO SURGEON STRL SZ8 (GLOVE) ×2 IMPLANT
GLOVE BIOGEL PI IND STRL 8 (GLOVE) ×2 IMPLANT
GLOVE BIOGEL PI INDICATOR 8 (GLOVE) ×2
GOWN STRL REUS W/TWL LRG LVL3 (GOWN DISPOSABLE) ×2 IMPLANT
GOWN STRL REUS W/TWL XL LVL3 (GOWN DISPOSABLE) ×2 IMPLANT
KIT BASIN OR (CUSTOM PROCEDURE TRAY) ×2 IMPLANT
MANIFOLD NEPTUNE II (INSTRUMENTS) ×2 IMPLANT
NDL MA TROC 1/2 (NEEDLE) ×1 IMPLANT
NDL SAFETY ECLIPSE 18X1.5 (NEEDLE) ×2 IMPLANT
NEEDLE HYPO 18GX1.5 SHARP (NEEDLE) ×4
NEEDLE MA TROC 1/2 (NEEDLE) IMPLANT
NS IRRIG 1000ML POUR BTL (IV SOLUTION) ×2 IMPLANT
PACK TOTAL JOINT (CUSTOM PROCEDURE TRAY) ×2 IMPLANT
PASSER SUT SWANSON 36MM LOOP (INSTRUMENTS) ×1 IMPLANT
POSITIONER SURGICAL ARM (MISCELLANEOUS) ×2 IMPLANT
STRIP CLOSURE SKIN 1/2X4 (GAUZE/BANDAGES/DRESSINGS) ×3 IMPLANT
SUT ETHIBOND NAB CT1 #1 30IN (SUTURE) IMPLANT
SUT MNCRL AB 4-0 PS2 18 (SUTURE) ×2 IMPLANT
SUT VIC AB 1 CT1 27 (SUTURE) ×2
SUT VIC AB 1 CT1 27XBRD ANTBC (SUTURE) IMPLANT
SUT VIC AB 1 CT1 36 (SUTURE) ×1 IMPLANT
SUT VIC AB 2-0 CT1 27 (SUTURE) ×4
SUT VIC AB 2-0 CT1 TAPERPNT 27 (SUTURE) ×2 IMPLANT
SYR 20CC LL (SYRINGE) ×1 IMPLANT
SYR 50ML LL SCALE MARK (SYRINGE) ×3 IMPLANT
TOWEL OR 17X26 10 PK STRL BLUE (TOWEL DISPOSABLE) ×2 IMPLANT

## 2017-02-06 NOTE — Brief Op Note (Signed)
02/06/2017  5:13 PM  PATIENT:  Gina Conner  51 y.o. female  PRE-OPERATIVE DIAGNOSIS:  Right hip bursitis; gluteal tendon tear  POST-OPERATIVE DIAGNOSIS:  Right hip bursitis  PROCEDURE:  Procedure(s): Right hip bursectomy (Right)  SURGEON:  Surgeon(s) and Role:    Gaynelle Arabian, MD - Primary  PHYSICIAN ASSISTANT:   ASSISTANTS: Arlee Muslim, PA-C   ANESTHESIA:   general  EBL:  No intake/output data recorded.  BLOOD ADMINISTERED:none  DRAINS: (Medium) Hemovact drain(s) in the right hip with  Suction Open   LOCAL MEDICATIONS USED:  OTHER Exparel  COUNTS:  YES  TOURNIQUET:  * No tourniquets in log *  DICTATION: .Other Dictation: Dictation Number [id number]  PLAN OF CARE: Admit for overnight observation  PATIENT DISPOSITION:  PACU - hemodynamically stable.

## 2017-02-06 NOTE — Interval H&P Note (Signed)
History and Physical Interval Note:  02/06/2017 4:03 PM  Gina Conner  has presented today for surgery, with the diagnosis of Right hip bursitis; gluteal tendon tear  The various methods of treatment have been discussed with the patient and family. After consideration of risks, benefits and other options for treatment, the patient has consented to  Procedure(s): Right hip bursectomy; gluteal tendon repair (Right) as a surgical intervention .  The patient's history has been reviewed, patient examined, no change in status, stable for surgery.  I have reviewed the patient's chart and labs.  Questions were answered to the patient's satisfaction.     [name]

## 2017-02-06 NOTE — H&P (Signed)
Subjective:   Patient is a 51 y.o. female presented with a history of right lateral hip pain.  Onset of symptoms wasseveral months ago with gradually worsening course since that time. She has had cortisone injections and physical therapy without lasting benefit and MRI showed gluteal tendon tear. She presents today for surgical management of this condition.  There are no active problems to display for this patient.  Past Medical History:  Diagnosis Date  . Hypothyroidism     Past Surgical History:  Procedure Laterality Date  . ABDOMINAL HYSTERECTOMY    . ROUX-EN-Y GASTRIC BYPASS  2002    Prescriptions Prior to Admission  Medication Sig Dispense Refill Last Dose  . acetaminophen (TYLENOL) 500 MG tablet Take 1,000 mg by mouth every 8 (eight) hours as needed for mild pain or headache.   Past Month at Unknown time  . Cholecalciferol (D 5000) 5000 units capsule Take 5,000 Units by mouth daily.   02/05/2017 at 0530  . Iron, Ferrous Gluconate, 256 (28 Fe) MG TABS Take 1 tablet by mouth daily.   02/05/2017 at 0530  . levothyroxine (SYNTHROID, LEVOTHROID) 50 MCG tablet Take 150 mcg by mouth daily before breakfast.   02/06/2017 at 0530   No Known Allergies  Social History  Substance Use Topics  . Smoking status: Never Smoker  . Smokeless tobacco: Never Used  . Alcohol use No     Comment: occ    History reviewed. No pertinent family history.  Review of Systems   Objective:   Patient Vitals for the past 8 hrs:  BP Temp Temp src Pulse Resp SpO2 Height Weight  02/06/17 1433 - - - - - - 5\' 7"  (1.702 m) 284 lb (128.8 kg)  02/06/17 1421 (!) 125/91 98.1 F (36.7 C) Oral 84 18 98 % - -   Physical Examination: General appearance - alert, well appearing, and in no distress Mental status - alert, oriented to person, place, and time Chest - clear to auscultation, no wheezes, rales or rhonchi, symmetric air entry Heart - normal rate, regular rhythm, normal S1, S2, no murmurs, rubs, clicks or  gallops Abdomen - soft, nontender, nondistended, no masses or organomegaly Neurological - alert, oriented, normal speech, no focal findings or movement disorder noted Right hip with tenderness laterally; normal range of motion and normal strength; antalgic gait   Imaging Review MRI with gluteal tendon tear  Assessment:  Right hip bursitis with gluteal tendon tear   Plan:   The various methods of treatment have been discussed with the patient and family.  After consideration of risks, benefits and other options for treatment, the patient has consented to surgical interventions (Right hip bursectomy and gluteal tendon tear). Procedure risks and potential complications were discussed with the patient who elects to proceed.

## 2017-02-06 NOTE — Transfer of Care (Signed)
Immediate Anesthesia Transfer of Care Note  Patient: Gina Conner  Procedure(s) Performed: Procedure(s): Right hip bursectomy (Right)  Patient Location: PACU  Anesthesia Type:General  Level of Consciousness:  sedated, patient cooperative and responds to stimulation  Airway & Oxygen Therapy:Patient Spontanous Breathing and Patient connected to face mask oxgen  Post-op Assessment:  Report given to PACU RN and Post -op Vital signs reviewed and stable  Post vital signs:  Reviewed and stable  Last Vitals:  Vitals:   02/06/17 1421  BP: (!) 125/91  Pulse: 84  Resp: 18  Temp: 36.7 C  SpO2: 34%    Complications: No apparent anesthesia complications

## 2017-02-06 NOTE — Anesthesia Procedure Notes (Signed)
Procedure Name: Intubation Date/Time: 02/06/2017 4:20 PM Performed by: Anne Fu Pre-anesthesia Checklist: Patient identified, Emergency Drugs available, Suction available, Patient being monitored and Timeout performed Patient Re-evaluated:Patient Re-evaluated prior to induction Oxygen Delivery Method: Circle system utilized Preoxygenation: Pre-oxygenation with 100% oxygen Induction Type: IV induction Ventilation: Mask ventilation without difficulty Laryngoscope Size: Mac and 4 Grade View: Grade I Tube type: Oral Tube size: 7.5 mm Number of attempts: 1 Airway Equipment and Method: Stylet Placement Confirmation: ETT inserted through vocal cords under direct vision,  positive ETCO2 and breath sounds checked- equal and bilateral Secured at: 21 cm Tube secured with: Tape Dental Injury: Teeth and Oropharynx as per pre-operative assessment

## 2017-02-06 NOTE — Anesthesia Postprocedure Evaluation (Signed)
Anesthesia Post Note  Patient: Gina Conner  Procedure(s) Performed: Procedure(s) (LRB): Right hip bursectomy (Right)     Patient location during evaluation: PACU Anesthesia Type: General Level of consciousness: awake and alert Pain management: pain level controlled Vital Signs Assessment: post-procedure vital signs reviewed and stable Respiratory status: spontaneous breathing, nonlabored ventilation, respiratory function stable and patient connected to nasal cannula oxygen Cardiovascular status: blood pressure returned to baseline and stable Postop Assessment: no apparent nausea or vomiting Anesthetic complications: no    Last Vitals:  Vitals:   02/06/17 1830 02/06/17 1915  BP: (!) 142/79 140/73  Pulse: 65 66  Resp: 17 16  Temp:  36.6 C  SpO2: 100% 95%    Last Pain:  Vitals:   02/06/17 1830  TempSrc:   PainSc: 3                  Marit Goodwill DAVID

## 2017-02-06 NOTE — Anesthesia Preprocedure Evaluation (Signed)
Anesthesia Evaluation  Patient identified by MRN, date of birth, ID band Patient awake    Reviewed: Allergy & Precautions, NPO status , Patient's Chart, lab work & pertinent test results  Airway Mallampati: I  TM Distance: >3 FB Neck ROM: Full    Dental   Pulmonary    Pulmonary exam normal        Cardiovascular Normal cardiovascular exam     Neuro/Psych    GI/Hepatic   Endo/Other    Renal/GU      Musculoskeletal   Abdominal   Peds  Hematology   Anesthesia Other Findings   Reproductive/Obstetrics                             Anesthesia Physical Anesthesia Plan  ASA: III  Anesthesia Plan: General   Post-op Pain Management:    Induction: Intravenous  PONV Risk Score and Plan: 3 and Ondansetron, Dexamethasone and Midazolam  Airway Management Planned: Oral ETT  Additional Equipment:   Intra-op Plan:   Post-operative Plan: Extubation in OR  Informed Consent: I have reviewed the patients History and Physical, chart, labs and discussed the procedure including the risks, benefits and alternatives for the proposed anesthesia with the patient or authorized representative who has indicated his/her understanding and acceptance.     Plan Discussed with: CRNA and Surgeon  Anesthesia Plan Comments:         Anesthesia Quick Evaluation

## 2017-02-07 ENCOUNTER — Encounter (HOSPITAL_COMMUNITY): Payer: Self-pay | Admitting: Orthopedic Surgery

## 2017-02-07 DIAGNOSIS — M7071 Other bursitis of hip, right hip: Secondary | ICD-10-CM | POA: Diagnosis not present

## 2017-02-07 MED ORDER — ASPIRIN EC 325 MG PO TBEC: 325.0000 mg | DELAYED_RELEASE_TABLET | Freq: Every day | ORAL | 3 refills | Status: AC

## 2017-02-07 MED ORDER — METHOCARBAMOL 500 MG PO TABS: 500.0000 mg | ORAL_TABLET | Freq: Four times a day (QID) | ORAL | 0 refills | Status: DC | PRN

## 2017-02-07 MED ORDER — ONDANSETRON HCL 4 MG PO TABS: 4.0000 mg | ORAL_TABLET | Freq: Four times a day (QID) | ORAL | 0 refills | Status: DC | PRN

## 2017-02-07 MED ORDER — OXYCODONE HCL 5 MG PO TABS: 5.0000 mg | ORAL_TABLET | ORAL | 0 refills | Status: DC | PRN

## 2017-02-07 NOTE — Op Note (Signed)
NAME:  Gina Conner, Gina Conner NO.:  [id number]  MEDICAL RECORD NO.:  [id number]  LOCATION:  [facility]                         FACILITY:  [facility]  PHYSICIAN:  Gaynelle Arabian, M.D.    DATE OF BIRTH:  [birth year]  DATE OF PROCEDURE:  02/06/2017 DATE OF DISCHARGE:                              OPERATIVE REPORT   PREOPERATIVE DIAGNOSIS:  Right hip bursitis and gluteal tendon tear.  POSTOPERATIVE DIAGNOSIS:  Right hip bursitis.  PROCEDURE:  Right hip bursectomy with removal of foreign body.  SURGEON:  Gaynelle Arabian, M.D.  ASSISTANT:  Alexzandrew L. Perkins, P.A.C.  ANESTHESIA:  General.  ESTIMATED BLOOD LOSS:  Minimal.  DRAINS:  Hemovac x1.  COMPLICATIONS:  None.  CONDITION:  Stable to recovery.  BRIEF CLINICAL NOTE:  [name] is a 51 year old female who has a long history of right lateral hip pain.  She has had several injections as well as therapy.  An MRI showed bursitis with a possible gluteal tendon tear.  She has failed nonoperative management and presents now for bursectomy and possible gluteal tendon repair.  PROCEDURE IN DETAIL:  After successful administration of general anesthetic, the patient was placed in the left lateral decubitus position with the right side up and held with the hip positioner.  Right lower extremity was isolated from her perineum with plastic drapes, and prepped and draped in the usual sterile fashion.  Short lateral incision was made, centered over the tip of the greater trochanter.  Skin was cut with 10-blade through the subcutaneous tissue to the fascia lata, which was incised in line with skin incision.  She had a thickened calcified bursa extending all the way down to the vastus ridge.  When I opened the bursa, there was about a 2 x 2-cm loose body/foreign body that came out. 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  I had a very thick capsule with soft feel.  We made a small incision at the end of the procedure and there  was fatty-appearing tissue and I sent this to Pathology for pathologic analysis.  I excised the bursa and stopped all minor bleeding with electrocautery.  Again, this is a large thickened bursa.  We then inspected the gluteal tendons.  There was no evidence of any gluteal tendon tear.  I inspected the gluteus medius and palpated under to also feel the minimus and there was no evidence of a tear any either.  We then thoroughly irrigated the wound bed with saline and injected the gluteal tendons, muscles and the fascia lata with total of 20 mL of Exparel mixed with 40 mL of saline.  I then closed the fascia with interrupted #1 Vicryl over Hemovac drain.  Subcu was closed in multiple layers with #1 Vicryl and 2-0 Vicryl, and subcuticular closed with running 4-0 Monocryl.  The incision was cleaned and dried, and Steri-Strips and a bulky sterile dressing applied.  She was then awakened and transported to recovery in stable condition.     Gaynelle Arabian, M.D.     FA/MEDQ  D:  02/06/2017  T:  02/07/2017  Job:  [id number]

## 2017-02-07 NOTE — Progress Notes (Signed)
Patient discharged to home w/ spouse. All belongings, instructions, prescriptions w/ patient. Walker at home. Husb present for all teaching. Verbalized understanding of instructions. Escorted to POV via w/c.

## 2017-02-07 NOTE — Discharge Instructions (Signed)
Dr. Gaynelle Arabian [facility] [address] [place], [place] [address] [phone]  Bursectomy / Gluteal Tendon Repair Discharge Instructions  HOME CARE INSTRUCTIONS  Remove items at home which could result in a fall. This includes throw rugs or furniture in walking pathways.   ICE to the affected hip every three hours for 30 minutes at a time and then as needed for pain and swelling.  Continue to use ice on the hip for pain and swelling from surgery. You may notice swelling that will progress down to the foot and ankle.  This is normal after surgery.  Elevate the leg when you are not up walking on it.    Continue to use the breathing machine which will help keep your temperature down.  It is common for your temperature to cycle up and down following surgery, especially at night when you are not up moving around and exerting yourself.  The breathing machine keeps your lungs expanded and your temperature down. Sit on high chairs which makes it easier to stand.  Sit on chairs with arms. Use the chair arms to help push yourself up when arising.   No Active Abduction of the leg (No pulling leg out to the side away from the body).  Use your walker for first several days until comfortable ambulating.  DIET You may resume your previous home diet once your are discharged from the hospital.  DRESSING / WOUND CARE / SHOWERING You may shower 3 days after surgery, but keep the wounds dry during showering.  You may use an occlusive plastic wrap (Press'n Seal for example), NO SOAKING/SUBMERGING IN THE BATHTUB.  If the bandage gets wet, change with a clean dry gauze.  If the incision gets wet, pat the wound dry with a clean towel. You may start showering three days following surgery but do not submerge the incision under water.Just pat the incision dry and apply a dry gauze dressing on daily. Change the surgical dressing daily and reapply a dry  dressing each time.  ACTIVITY Walk with your walker as instructed. Use walker as long as suggested by your caregivers. Avoid periods of inactivity such as sitting longer than an hour when not asleep. This helps prevent blood clots.  You may resume a sexual relationship in one month or when given the OK by your doctor.  You may return to work once you are cleared by your doctor.  Do not drive a car for 6 weeks or until released by you surgeon.  Do not drive while taking narcotics.  WEIGHT BEARING Weight bearing as tolerated with assist device (walker, cane, etc) as directed, use it as long as suggested by your surgeon or therapist, typically at least 4-6 weeks.  POSTOPERATIVE CONSTIPATION PROTOCOL Constipation - defined medically as fewer than three stools per week and severe constipation as less than one stool per week.  One of the most common issues patients have following surgery is constipation.  Even if you have a regular bowel pattern at home, your normal regimen is likely to be disrupted due to multiple reasons following surgery.  Combination of anesthesia, postoperative narcotics, change in appetite and fluid intake all can affect your bowels.  In order to avoid complications following surgery, here are some recommendations in order to help you during your recovery period.  Colace (docusate) - Pick up an over-the-counter form of Colace or another stool softener and take twice a day as long as you are requiring  postoperative pain medications.  Take with a full glass of water daily.  If you experience loose stools or diarrhea, hold the colace until you stool forms back up.  If your symptoms do not get better within 1 week or if they get worse, check with your doctor.  Dulcolax (bisacodyl) - Pick up over-the-counter and take as directed by the product packaging as needed to assist with the movement of your bowels.  Take with a full glass of water.  Use this product as needed if not relieved  by Colace only.   MiraLax (polyethylene glycol) - Pick up over-the-counter to have on hand.  MiraLax is a solution that will increase the amount of water in your bowels to assist with bowel movements.  Take as directed and can mix with a glass of water, juice, soda, coffee, or tea.  Take if you go more than two days without a movement. Do not use MiraLax more than once per day. Call your doctor if you are still constipated or irregular after using this medication for 7 days in a row.  If you continue to have problems with postoperative constipation, please contact the office for further assistance and recommendations.  If you experience "the worst abdominal pain ever" or develop nausea or vomiting, please contact the office immediatly for further recommendations for treatment.  ITCHING  If you experience itching with your medications, try taking only a single pain pill, or even half a pain pill at a time.  You can also use Benadryl over the counter for itching or also to help with sleep.   TED HOSE STOCKINGS Wear the elastic stockings on both legs for three weeks following surgery during the day but you may remove then at night for sleeping.  MEDICATIONS See your medication summary on the After Visit Summary that the nursing staff will review with you prior to discharge.  You may have some home medications which will be placed on hold until you complete the course of blood thinner medication.  It is important for you to complete the blood thinner medication as prescribed by your surgeon.  Continue your approved medications as instructed at time of discharge.  PRECAUTIONS If you experience chest pain or shortness of breath - call 911 immediately for transfer to the hospital emergency department.  If you develop a fever greater that 101 F, purulent drainage from wound, increased redness or drainage from wound, foul odor from the wound/dressing, or calf pain - CONTACT YOUR SURGEON.                                                    FOLLOW-UP APPOINTMENTS Make sure you keep all of your appointments after your operation with your surgeon and caregivers. You should call the office at the above phone number and make an appointment for approximately two weeks after the date of your surgery or on the date instructed by your surgeon outlined in the "After Visit Summary".   Pick up stool softner and laxative for home use following surgery while on pain medications. Do not submerge incision under water. Please use good hand washing techniques while changing dressing each day. May shower starting three days after surgery. Please use a clean towel to pat the incision dry following showers. Continue to use ice for pain and swelling after surgery. Do not use  any lotions or creams on the incision until instructed by your surgeon.  Take a full dose 325 mg Aspirin daily for three weeks.

## 2017-02-07 NOTE — Discharge Summary (Signed)
Physician Discharge Summary   Patient ID: Gina Conner MRN: [id number] DOB/AGE: [birth year] 51 y.o.  Admit date: 02/06/2017 Discharge date: 02/07/2017  Primary Diagnosis:  Right hip bursitis and gluteal tendon tear. Admission Diagnoses:  Past Medical History:  Diagnosis Date  . Hypothyroidism    Discharge Diagnoses:   Principal Problem:   Tendinopathy of right gluteus medius  Estimated body mass index is 44.48 kg/m as calculated from the following:   Height as of this encounter: 5\' 7"  (1.702 m).   Weight as of this encounter: 128.8 kg (284 lb).  Procedure(s) (LRB): Right hip bursectomy (Right)   Consults: None  HPI: Gina Conner is a 51 year old female who has a long history of right lateral hip pain.  She has had several injections as well as therapy.  An MRI showed bursitis with a possible gluteal tendon tear.  She has failed nonoperative management and presents now for bursectomy and possible gluteal tendon repair. Laboratory Data: Hospital Outpatient Visit on 01/30/2017  Component Date Value Ref Range Status  . WBC 01/30/2017 7.1  4.0 - 10.5 K/uL Final  . RBC 01/30/2017 4.58  3.87 - 5.11 MIL/uL Final  . Hemoglobin 01/30/2017 14.9  12.0 - 15.0 g/dL Final  . HCT 01/30/2017 43.3  36.0 - 46.0 % Final  . MCV 01/30/2017 94.5  78.0 - 100.0 fL Final  . MCH 01/30/2017 32.5  26.0 - 34.0 pg Final  . MCHC 01/30/2017 34.4  30.0 - 36.0 g/dL Final  . RDW 01/30/2017 12.5  11.5 - 15.5 % Final  . Platelets 01/30/2017 328  150 - 400 K/uL Final     X-Rays:No results found.  EKG:No orders found for this or any previous visit.   Hospital Course: Patient was admitted to [facility] and taken to the OR and underwent the above state procedure without complications.  Patient tolerated the procedure well and was later transferred to the recovery room and then to the orthopaedic floor for postoperative care.  They were given PO and IV analgesics for pain control following their  surgery.  They were given 24 hours of postoperative antibiotics of  Anti-infectives    Start     Dose/Rate Route Frequency Ordered Stop   02/06/17 2200  ceFAZolin (ANCEF) IVPB 2g/100 mL premix     2 g 200 mL/hr over 30 Minutes Intravenous Every 6 hours 02/06/17 1909 02/07/17 1559   02/06/17 0600  ceFAZolin (ANCEF) 3 g in dextrose 5 % 50 mL IVPB     3 g 130 mL/hr over 30 Minutes Intravenous On call to O.R. 02/05/17 1138 02/07/17 0948     and started on DVT prophylaxis in the form of Lovenox. Patient was encouraged to get up and ambulate the day after surgery.  The patient was allowed to be WBAT with therapy. Discharge planning was consulted to help with postop disposition and equipment needs.  Patient had a good night on the evening of surgery.  They started to get up OOB with therapy on day one.   Patient was seen in rounds and was ready to go home following morning ambulation.  Diet - Regular diet Follow up - in two weeks. Call office for appointment at [phone]. Activity - Weight bearing as tolerated to the surgical leg.  Walker for first several days until comfortable ambulating. May start showering three days following surgery but do not submerge incision under water. Continue to use ice for pain and swelling from surgery.  Baby Aspirin 81 mg daily for three  weeks.  Please use walker for the first couple of days until comfortable ambulating.  May start changing dressing tomorrow with dry gauze and tape.  Disposition - Home Condition Upon Discharge - Good D/C Meds - See DC Summary DVT Prophylaxis - Aspirin 325 mg daily for three weeks.    Allergies as of 02/07/2017   No Known Allergies     Medication List    TAKE these medications   acetaminophen 500 MG tablet Commonly known as:  TYLENOL Take 1,000 mg by mouth every 8 (eight) hours as needed for mild pain or headache.   aspirin EC 325 MG tablet Take 1 tablet (325 mg total) by mouth daily. Take a full dose Aspirin 325 mg daily  for three weeks.   D 5000 5000 units capsule Generic drug:  Cholecalciferol Take 5,000 Units by mouth daily.   Iron (Ferrous Gluconate) 256 (28 Fe) MG Tabs Take 1 tablet by mouth daily.   levothyroxine 50 MCG tablet Commonly known as:  SYNTHROID, LEVOTHROID Take 150 mcg by mouth daily before breakfast.   methocarbamol 500 MG tablet Commonly known as:  ROBAXIN Take 1 tablet (500 mg total) by mouth every 6 (six) hours as needed for muscle spasms.   ondansetron 4 MG tablet Commonly known as:  ZOFRAN Take 1 tablet (4 mg total) by mouth every 6 (six) hours as needed for nausea.   oxyCODONE 5 MG immediate release tablet Commonly known as:  Oxy IR/ROXICODONE Take 1-2 tablets (5-10 mg total) by mouth every 4 (four) hours as needed for moderate pain or severe pain.            Discharge Care Instructions        Start     Ordered   02/07/17 0000  methocarbamol (ROBAXIN) 500 MG tablet  Every 6 hours PRN    Question:  Supervising Provider  Answer:  Gaynelle Arabian   02/07/17 0818   02/07/17 0000  oxyCODONE (OXY IR/ROXICODONE) 5 MG immediate release tablet  Every 4 hours PRN    Question:  Supervising Provider  Answer:  Gaynelle Arabian   02/07/17 0818   02/07/17 0000  aspirin EC 325 MG tablet  Daily    Question:  Supervising Provider  Answer:  Gaynelle Arabian   02/07/17 0818   02/07/17 0000  ondansetron (ZOFRAN) 4 MG tablet  Every 6 hours PRN    Question:  Supervising Provider  Answer:  Gaynelle Arabian   02/07/17 0959     Follow-up Information    Gaynelle Arabian, MD. Schedule an appointment as soon as possible for a visit on 02/19/2017.   Specialty:  Orthopedic Surgery Contact information: [address] [place] [address] [phone]           Signed: Arlee Muslim, PA-C Orthopaedic Surgery 02/07/2017, 9:59 AM

## 2017-02-07 NOTE — Progress Notes (Signed)
   Subjective: 1 Day Post-Op Procedure(s) (LRB): Right hip bursectomy (Right) Patient reports pain as mild.   Patient seen in rounds by Dr. Wynelle Link. Patient is well, but has had some minor complaints of pain in the hip, requiring pain medications Patient is ready to go home later today.  Objective: Vital signs in last 24 hours: Temp:  [97.6 F (36.4 C)-98.4 F (36.9 C)] 98.4 F (36.9 C) (09/27 0517) Pulse Rate:  [59-84] 63 (09/27 0517) Resp:  [15-18] 16 (09/27 0517) BP: (116-147)/(59-91) 118/64 (09/27 0517) SpO2:  [95 %-100 %] 96 % (09/27 0517) Weight:  [128.8 kg (284 lb)] 128.8 kg (284 lb) (09/26 1433)  Intake/Output from previous day:  Intake/Output Summary (Last 24 hours) at 02/07/17 0814 Last data filed at 02/07/17 0600  Gross per 24 hour  Intake             2610 ml  Output             1020 ml  Net             1590 ml    Intake/Output this shift: No intake/output data recorded.  EXAM: General - Patient is Alert and Appropriate Extremity - Neurovascular intact Sensation intact distally Intact pulses distally Dressing - clean, dry, no drainage Motor Function - intact, moving foot and toes well on exam.   Assessment/Plan: 1 Day Post-Op Procedure(s) (LRB): Right hip bursectomy (Right) Procedure(s) (LRB): Right hip bursectomy (Right) Past Medical History:  Diagnosis Date  . Hypothyroidism    Principal Problem:   Tendinopathy of right gluteus medius  Estimated body mass index is 44.48 kg/m as calculated from the following:   Height as of this encounter: 5\' 7"  (1.702 m).   Weight as of this encounter: 128.8 kg (284 lb). Up with therapy Diet - Regular diet Follow up - in two weeks. Call office for appointment at [phone]. Activity - Weight bearing as tolerated to the surgical leg.   Walker for first several days until comfortable ambulating. May start showering three days following surgery but do not submerge incision under water. Continue to use ice for pain  and swelling from surgery.  Baby Aspirin 81 mg daily for three weeks.  Please use walker for the first couple of days until comfortable ambulating.  May start changing dressing tomorrow with dry gauze and tape.  Disposition - Home Condition Upon Discharge - Stable D/C Meds - See DC Summary DVT Prophylaxis - Aspirin 325 mg daily for three weeks.  Arlee Muslim, PA-C Orthopaedic Surgery 02/07/2017, 8:14 AM

## 2019-05-18 ENCOUNTER — Other Ambulatory Visit: Payer: Self-pay

## 2019-05-18 ENCOUNTER — Inpatient Hospital Stay (HOSPITAL_COMMUNITY)
Admission: EM | Admit: 2019-05-18 | Discharge: 2019-05-25 | DRG: 177 | Disposition: A | Discharge status: Home Health | Payer: Commercial Managed Care - PPO | Attending: Internal Medicine | Admitting: Internal Medicine

## 2019-05-18 ENCOUNTER — Emergency Department (HOSPITAL_COMMUNITY): Payer: Commercial Managed Care - PPO

## 2019-05-18 ENCOUNTER — Encounter (HOSPITAL_COMMUNITY): Payer: Self-pay | Admitting: Emergency Medicine

## 2019-05-18 DIAGNOSIS — J9601 Acute respiratory failure with hypoxia: Secondary | ICD-10-CM | POA: Diagnosis present

## 2019-05-18 DIAGNOSIS — R0602 Shortness of breath: Secondary | ICD-10-CM

## 2019-05-18 DIAGNOSIS — J9600 Acute respiratory failure, unspecified whether with hypoxia or hypercapnia: Secondary | ICD-10-CM | POA: Diagnosis present

## 2019-05-18 DIAGNOSIS — U071 COVID-19: Secondary | ICD-10-CM | POA: Diagnosis not present

## 2019-05-18 DIAGNOSIS — E039 Hypothyroidism, unspecified: Secondary | ICD-10-CM | POA: Diagnosis present

## 2019-05-18 DIAGNOSIS — J1282 Pneumonia due to coronavirus disease 2019: Secondary | ICD-10-CM | POA: Diagnosis present

## 2019-05-18 DIAGNOSIS — F419 Anxiety disorder, unspecified: Secondary | ICD-10-CM | POA: Diagnosis present

## 2019-05-18 DIAGNOSIS — Z6841 Body Mass Index (BMI) 40.0 and over, adult: Secondary | ICD-10-CM

## 2019-05-18 DIAGNOSIS — Z79890 Hormone replacement therapy (postmenopausal): Secondary | ICD-10-CM

## 2019-05-18 DIAGNOSIS — Z90710 Acquired absence of both cervix and uterus: Secondary | ICD-10-CM

## 2019-05-18 DIAGNOSIS — R0600 Dyspnea, unspecified: Secondary | ICD-10-CM

## 2019-05-18 DIAGNOSIS — E669 Obesity, unspecified: Secondary | ICD-10-CM | POA: Diagnosis present

## 2019-05-18 DIAGNOSIS — Z9884 Bariatric surgery status: Secondary | ICD-10-CM

## 2019-05-18 DIAGNOSIS — Z79899 Other long term (current) drug therapy: Secondary | ICD-10-CM

## 2019-05-18 LAB — CBC WITH DIFFERENTIAL/PLATELET
Abs Immature Granulocytes: 0.04 10*3/uL (ref 0.00–0.07)
Basophils Absolute: 0 10*3/uL (ref 0.0–0.1)
Basophils Relative: 1 %
Eosinophils Absolute: 0.1 10*3/uL (ref 0.0–0.5)
Eosinophils Relative: 2 %
HCT: 46 % (ref 36.0–46.0)
Hemoglobin: 14.9 g/dL (ref 12.0–15.0)
Immature Granulocytes: 1 %
Lymphocytes Relative: 22 %
Lymphs Abs: 1.2 10*3/uL (ref 0.7–4.0)
MCH: 30.6 pg (ref 26.0–34.0)
MCHC: 32.4 g/dL (ref 30.0–36.0)
MCV: 94.5 fL (ref 80.0–100.0)
Monocytes Absolute: 0.3 10*3/uL (ref 0.1–1.0)
Monocytes Relative: 5 %
Neutro Abs: 3.8 10*3/uL (ref 1.7–7.7)
Neutrophils Relative %: 69 %
Platelets: 282 10*3/uL (ref 150–400)
RBC: 4.87 MIL/uL (ref 3.87–5.11)
RDW: 13 % (ref 11.5–15.5)
WBC: 5.4 10*3/uL (ref 4.0–10.5)
nRBC: 0 % (ref 0.0–0.2)

## 2019-05-18 LAB — COMPREHENSIVE METABOLIC PANEL
ALT: 36 U/L (ref 0–44)
AST: 49 U/L — ABNORMAL HIGH (ref 15–41)
Albumin: 3.5 g/dL (ref 3.5–5.0)
Alkaline Phosphatase: 70 U/L (ref 38–126)
Anion gap: 11 (ref 5–15)
BUN: 11 mg/dL (ref 6–20)
CO2: 26 mmol/L (ref 22–32)
Calcium: 8.7 mg/dL — ABNORMAL LOW (ref 8.9–10.3)
Chloride: 100 mmol/L (ref 98–111)
Creatinine, Ser: 0.65 mg/dL (ref 0.44–1.00)
GFR calc Af Amer: >60 mL/min (ref >60)
GFR calc non Af Amer: >60 mL/min (ref >60)
Glucose, Bld: 117 mg/dL — ABNORMAL HIGH (ref 70–99)
Potassium: 3.7 mmol/L (ref 3.5–5.1)
Sodium: 137 mmol/L (ref 135–145)
Total Bilirubin: 0.6 mg/dL (ref 0.3–1.2)
Total Protein: 6.6 g/dL (ref 6.5–8.1)

## 2019-05-18 LAB — LACTATE DEHYDROGENASE: LDH: 306 U/L — ABNORMAL HIGH (ref 98–192)

## 2019-05-18 LAB — D-DIMER, QUANTITATIVE: D-Dimer, Quant: 0.39 ug/mL-FEU (ref 0.00–0.50)

## 2019-05-18 LAB — I-STAT BETA HCG BLOOD, ED (MC, WL, AP ONLY)
I-stat hCG, quantitative: <5 m[IU]/mL (ref <5)
I-stat hCG, quantitative: <5 m[IU]/mL (ref <5)

## 2019-05-18 LAB — LACTIC ACID, PLASMA: Lactic Acid, Venous: 1.8 mmol/L (ref 0.5–1.9)

## 2019-05-18 LAB — FERRITIN: Ferritin: 52 ng/mL (ref 11–307)

## 2019-05-18 LAB — FIBRINOGEN: Fibrinogen: 635 mg/dL — ABNORMAL HIGH (ref 210–475)

## 2019-05-18 LAB — TRIGLYCERIDES: Triglycerides: 126 mg/dL (ref <150)

## 2019-05-18 LAB — C-REACTIVE PROTEIN: CRP: 14.6 mg/dL — ABNORMAL HIGH (ref <1.0)

## 2019-05-18 LAB — PROCALCITONIN: Procalcitonin: <0.1 ng/mL

## 2019-05-18 LAB — POC SARS CORONAVIRUS 2 AG -  ED: SARS Coronavirus 2 Ag: POSITIVE — AB

## 2019-05-18 NOTE — ED Provider Notes (Signed)
MSE was initiated and I personally evaluated the patient  5:29 PM on May 18, 2019.  Patient placed in Quick Look pathway, seen and evaluated   Chief Complaint: dyspnea, COVID+  HPI:   Briefly patient is a 54 year old with a history of hypothyroidism with positive covid 19 testing 05/12/19, symptom onset 05/11/19, presents with increased cough, dyspnea & hypoxia on @ home pulse oximeter. Initially had mild cough which seemed to worsen, no sputum production, reports associated dyspnea & fatigue. Seen by PCP given albuterol inhaler & prednisone 40 mg daily for 5 days- completed yesterday with some improvement but then seemed to worse with new SpO2 in the mid to upper 80s on her at home pulse oximeter which prompted ED visit. Denies fever- has been taking tylenol. Denies chest pain or syncope.   ROS: Positive for cough, dyspnea, fatigue. Negative for chest pain, syncope.   Physical Exam:   Gen: Ill appearing, non toxic.   Neuro: Awake and Alert  Skin: Warm    Focused Exam: Lungs: CTA. Tachypneic. Spo2 94% on RA @ rest, desaturated to 84% on RA with ambulation throughout triage room.   Initiation of care has begun. The patient has been counseled on the process, plan, and necessity for staying for the completion/evaluation, and the remainder of the medical screening examination. She was placed on 2L via  with improvement in her oxygenation in triage, we unfortunately do not have a bed to place her in at this time. Continue supplemental oxygen during ED wait. I discussed with patient  Importance of alerting staff to any new or worsening symptoms or any other concerns throughout his/her ER visit.   SAKEENAH DOORLEY was evaluated in Emergency Department on 05/18/2019 for the symptoms described in the history of present illness. He/she was evaluated in the context of the global COVID-19 pandemic, which necessitated consideration that the patient might be at risk for infection with the SARS-CoV-2 virus  that causes COVID-19. Institutional protocols and algorithms that pertain to the evaluation of patients at risk for COVID-19 are in a state of rapid change based on information released by regulatory bodies including the CDC and federal and state organizations. These policies and algorithms were followed during the patient's care in the ED.     Leafy Kindle 05/18/19 1737    Lucrezia Starch, MD 05/20/19 [phone]

## 2019-05-18 NOTE — ED Triage Notes (Signed)
Pt diagnosed with COVID on 12/29. Reports her cough has increased and she has been monitoring her SpO2 at home and noticed that it dips to the low 80s. Pt completed prednisone with no change. Placed on 2L [place] in triage.

## 2019-05-19 ENCOUNTER — Encounter (HOSPITAL_COMMUNITY): Payer: Self-pay | Admitting: Internal Medicine

## 2019-05-19 DIAGNOSIS — U071 COVID-19: Secondary | ICD-10-CM | POA: Diagnosis present

## 2019-05-19 DIAGNOSIS — Z79890 Hormone replacement therapy (postmenopausal): Secondary | ICD-10-CM | POA: Diagnosis not present

## 2019-05-19 DIAGNOSIS — J9600 Acute respiratory failure, unspecified whether with hypoxia or hypercapnia: Secondary | ICD-10-CM | POA: Diagnosis present

## 2019-05-19 DIAGNOSIS — Z9884 Bariatric surgery status: Secondary | ICD-10-CM | POA: Diagnosis not present

## 2019-05-19 DIAGNOSIS — Z79899 Other long term (current) drug therapy: Secondary | ICD-10-CM | POA: Diagnosis not present

## 2019-05-19 DIAGNOSIS — Z6841 Body Mass Index (BMI) 40.0 and over, adult: Secondary | ICD-10-CM | POA: Diagnosis not present

## 2019-05-19 DIAGNOSIS — Z90710 Acquired absence of both cervix and uterus: Secondary | ICD-10-CM | POA: Diagnosis not present

## 2019-05-19 DIAGNOSIS — J1282 Pneumonia due to coronavirus disease 2019: Secondary | ICD-10-CM | POA: Diagnosis present

## 2019-05-19 DIAGNOSIS — E669 Obesity, unspecified: Secondary | ICD-10-CM | POA: Diagnosis present

## 2019-05-19 DIAGNOSIS — R0600 Dyspnea, unspecified: Secondary | ICD-10-CM | POA: Diagnosis present

## 2019-05-19 DIAGNOSIS — J9601 Acute respiratory failure with hypoxia: Secondary | ICD-10-CM | POA: Diagnosis present

## 2019-05-19 DIAGNOSIS — E039 Hypothyroidism, unspecified: Secondary | ICD-10-CM | POA: Diagnosis present

## 2019-05-19 DIAGNOSIS — F419 Anxiety disorder, unspecified: Secondary | ICD-10-CM | POA: Diagnosis present

## 2019-05-19 LAB — HIV ANTIBODY (ROUTINE TESTING W REFLEX): HIV Screen 4th Generation wRfx: NONREACTIVE

## 2019-05-19 LAB — BRAIN NATRIURETIC PEPTIDE: B Natriuretic Peptide: 17.6 pg/mL (ref 0.0–100.0)

## 2019-05-19 MED ORDER — ZINC SULFATE 220 (50 ZN) MG PO CAPS
220.0000 mg | ORAL_CAPSULE | Freq: Every day | ORAL | Status: DC
  Administered 2019-05-19 – 2019-05-25 (×7): 220 mg via ORAL
  Filled 2019-05-19 (×7): qty 1

## 2019-05-19 MED ORDER — BENZONATATE 100 MG PO CAPS
200.0000 mg | ORAL_CAPSULE | Freq: Three times a day (TID) | ORAL | Status: DC | PRN
  Administered 2019-05-19 – 2019-05-25 (×7): 200 mg via ORAL
  Filled 2019-05-19 (×7): qty 2

## 2019-05-19 MED ORDER — ASCORBIC ACID 500 MG PO TABS
500.0000 mg | ORAL_TABLET | Freq: Every day | ORAL | Status: DC
  Administered 2019-05-19 – 2019-05-25 (×7): 500 mg via ORAL
  Filled 2019-05-19 (×7): qty 1

## 2019-05-19 MED ORDER — VITAMIN D3 25 MCG PO TABS
1000.0000 [IU] | ORAL_TABLET | Freq: Every day | ORAL | Status: DC
  Administered 2019-05-19: 1000 [IU] via ORAL
  Filled 2019-05-19 (×2): qty 1

## 2019-05-19 MED ORDER — ONDANSETRON HCL 4 MG PO TABS: 4.0000 mg | ORAL_TABLET | Freq: Four times a day (QID) | ORAL | Status: DC | PRN

## 2019-05-19 MED ORDER — CYCLOBENZAPRINE HCL 10 MG PO TABS
5.0000 mg | ORAL_TABLET | Freq: Three times a day (TID) | ORAL | Status: DC | PRN
  Administered 2019-05-25: 5 mg via ORAL
  Filled 2019-05-19: qty 1

## 2019-05-19 MED ORDER — SODIUM CHLORIDE 0.9 % IV SOLN
200.0000 mg | Freq: Once | INTRAVENOUS | Status: AC
  Administered 2019-05-19: 200 mg via INTRAVENOUS
  Filled 2019-05-19: qty 200

## 2019-05-19 MED ORDER — ACETAMINOPHEN 325 MG PO TABS
650.0000 mg | ORAL_TABLET | Freq: Four times a day (QID) | ORAL | Status: DC | PRN
  Administered 2019-05-23 – 2019-05-24 (×2): 650 mg via ORAL
  Filled 2019-05-19 (×2): qty 2

## 2019-05-19 MED ORDER — ALPRAZOLAM 0.5 MG PO TABS: 0.5000 mg | ORAL_TABLET | Freq: Every evening | ORAL | Status: DC | PRN

## 2019-05-19 MED ORDER — DEXAMETHASONE 4 MG PO TABS
6.0000 mg | ORAL_TABLET | Freq: Every day | ORAL | Status: DC
  Administered 2019-05-19: 6 mg via ORAL
  Filled 2019-05-19 (×2): qty 2

## 2019-05-19 MED ORDER — IPRATROPIUM BROMIDE HFA 17 MCG/ACT IN AERS
2.0000 | INHALATION_SPRAY | Freq: Three times a day (TID) | RESPIRATORY_TRACT | Status: DC
  Administered 2019-05-19 – 2019-05-25 (×19): 2 via RESPIRATORY_TRACT
  Filled 2019-05-19 (×2): qty 12.9

## 2019-05-19 MED ORDER — DEXAMETHASONE SODIUM PHOSPHATE 10 MG/ML IJ SOLN
6.0000 mg | INTRAMUSCULAR | Status: DC
  Administered 2019-05-19: 6 mg via INTRAVENOUS
  Filled 2019-05-19: qty 1

## 2019-05-19 MED ORDER — ENOXAPARIN SODIUM 80 MG/0.8ML ~~LOC~~ SOLN
0.5000 mg/kg | Freq: Every day | SUBCUTANEOUS | Status: DC
  Administered 2019-05-20 – 2019-05-25 (×6): 65 mg via SUBCUTANEOUS
  Filled 2019-05-19 (×6): qty 0.8

## 2019-05-19 MED ORDER — METHYLPREDNISOLONE SODIUM SUCC 125 MG IJ SOLR
60.0000 mg | Freq: Every day | INTRAMUSCULAR | Status: DC
  Administered 2019-05-19 – 2019-05-21 (×3): 60 mg via INTRAVENOUS
  Filled 2019-05-19 (×3): qty 2

## 2019-05-19 MED ORDER — ALBUTEROL SULFATE HFA 108 (90 BASE) MCG/ACT IN AERS
2.0000 | INHALATION_SPRAY | Freq: Three times a day (TID) | RESPIRATORY_TRACT | Status: DC
  Administered 2019-05-19 – 2019-05-25 (×19): 2 via RESPIRATORY_TRACT
  Filled 2019-05-19: qty 6.7

## 2019-05-19 MED ORDER — VITAMIN D 25 MCG (1000 UNIT) PO TABS
5000.0000 [IU] | ORAL_TABLET | Freq: Every day | ORAL | Status: DC
  Administered 2019-05-19 – 2019-05-25 (×7): 5000 [IU] via ORAL
  Filled 2019-05-19 (×7): qty 5

## 2019-05-19 MED ORDER — GUAIFENESIN-DM 100-10 MG/5ML PO SYRP
5.0000 mL | ORAL_SOLUTION | ORAL | Status: DC | PRN
  Administered 2019-05-19 – 2019-05-25 (×10): 5 mL via ORAL
  Filled 2019-05-19: qty 10
  Filled 2019-05-19: qty 5
  Filled 2019-05-19 (×8): qty 10

## 2019-05-19 MED ORDER — THYROID 60 MG PO TABS
60.0000 mg | ORAL_TABLET | Freq: Every day | ORAL | Status: DC
  Administered 2019-05-20 – 2019-05-25 (×6): 60 mg via ORAL
  Filled 2019-05-19 (×8): qty 1

## 2019-05-19 MED ORDER — HYDROCOD POLST-CPM POLST ER 10-8 MG/5ML PO SUER
5.0000 mL | Freq: Two times a day (BID) | ORAL | Status: AC
  Administered 2019-05-19 – 2019-05-20 (×3): 5 mL via ORAL
  Filled 2019-05-19 (×3): qty 5

## 2019-05-19 MED ORDER — ENOXAPARIN SODIUM 40 MG/0.4ML ~~LOC~~ SOLN
40.0000 mg | Freq: Every day | SUBCUTANEOUS | Status: DC
  Administered 2019-05-19: 40 mg via SUBCUTANEOUS
  Filled 2019-05-19: qty 0.4

## 2019-05-19 MED ORDER — ALBUTEROL SULFATE HFA 108 (90 BASE) MCG/ACT IN AERS
2.0000 | INHALATION_SPRAY | Freq: Four times a day (QID) | RESPIRATORY_TRACT | Status: DC
  Administered 2019-05-19 (×2): 2 via RESPIRATORY_TRACT
  Filled 2019-05-19: qty 6.7

## 2019-05-19 MED ORDER — ONDANSETRON HCL 4 MG/2ML IJ SOLN
4.0000 mg | Freq: Four times a day (QID) | INTRAMUSCULAR | Status: DC | PRN
  Administered 2019-05-20 – 2019-05-22 (×5): 4 mg via INTRAVENOUS
  Filled 2019-05-19 (×5): qty 2

## 2019-05-19 MED ORDER — SODIUM CHLORIDE 0.9 % IV SOLN
100.0000 mg | Freq: Every day | INTRAVENOUS | Status: AC
  Administered 2019-05-20 – 2019-05-23 (×4): 100 mg via INTRAVENOUS
  Filled 2019-05-19 (×4): qty 20

## 2019-05-19 NOTE — ED Notes (Signed)
ED TO INPATIENT HANDOFF REPORT  ED Nurse Name and Phone #: Lorrin Goodell [phone]  S Name/Age/Gender Gina Conner 54 y.o. female Room/Bed: 010C/010C  Code Status   Code Status: Full Code  Home/SNF/Other Home Patient oriented to: self, place, time and situation Is this baseline? Yes   Triage Complete: Triage complete  Chief Complaint Acute hypoxemic respiratory failure due to COVID-19 ([place]) [U07.1, J96.01] Acute respiratory failure due to COVID-19 ([place]) [U07.1, J96.00]  Triage Note Pt diagnosed with COVID on 12/29. Reports her cough has increased and she has been monitoring her SpO2 at home and noticed that it dips to the low 80s. Pt completed prednisone with no change. Placed on 2L [place] in triage.    Allergies No Known Allergies  Level of Care/Admitting Diagnosis ED Disposition    ED Disposition Condition [facility]: [place] [[id number]]  Level of Care: Telemetry [5]  Covid Evaluation: Confirmed COVID Positive  Diagnosis: Acute respiratory failure due to COVID-19 [facility]) [[id number]]  Admitting Physician: Kayleen Memos [[id number]]  Attending Physician: Kayleen Memos [[id number]]  Estimated length of stay: past midnight tomorrow  Certification:: I certify this patient will need inpatient services for at least 2 midnights       B Medical/Surgery History Past Medical History:  Diagnosis Date  . Hypothyroidism    Past Surgical History:  Procedure Laterality Date  . ABDOMINAL HYSTERECTOMY    . EXCISION/RELEASE BURSA HIP Right 02/06/2017   Procedure: Right hip bursectomy;  Surgeon: Gaynelle Arabian, MD;  Location: [facility];  Service: Orthopedics;  Laterality: Right;  . ROUX-EN-Y GASTRIC BYPASS  2002     A IV Location/Drains/Wounds Patient Lines/Drains/Airways Status   Active Line/Drains/Airways    Name:   Placement date:   Placement time:   Site:   Days:   Peripheral IV 05/19/19 Right Hand   05/19/19    0158    Hand   less than 1           Intake/Output Last 24 hours No intake or output data in the 24 hours ending 05/19/19 1300  Labs/Imaging Results for orders placed or performed during the hospital encounter of 05/18/19 (from the past 48 hour(s))  Lactic acid, plasma     Status: None   Collection Time: 05/18/19  5:43 PM  Result Value Ref Range   Lactic Acid, Venous 1.8 0.5 - 1.9 mmol/L    Comment: Performed at [facility] [address], [place], [place] [address]  Blood Culture (routine x 2)     Status: None (Preliminary result)   Collection Time: 05/18/19  5:43 PM   Specimen: BLOOD RIGHT ARM  Result Value Ref Range   Specimen Description BLOOD RIGHT ARM    Special Requests      BOTTLES DRAWN AEROBIC AND ANAEROBIC Blood Culture adequate volume   Culture      NO GROWTH < 24 HOURS Performed at [facility], [place] [address] [place], [place] [address]    Report Status PENDING   CBC WITH DIFFERENTIAL     Status: None   Collection Time: 05/18/19  5:43 PM  Result Value Ref Range   WBC 5.4 4.0 - 10.5 K/uL   RBC 4.87 3.87 - 5.11 MIL/uL   Hemoglobin 14.9 12.0 - 15.0 g/dL   HCT 46.0 36.0 - 46.0 %   MCV 94.5 80.0 - 100.0 fL   MCH 30.6 26.0 - 34.0 pg   MCHC 32.4 30.0 -  36.0 g/dL   RDW 13.0 11.5 - 15.5 %   Platelets 282 150 - 400 K/uL   nRBC 0.0 0.0 - 0.2 %   Neutrophils Relative % 69 %   Neutro Abs 3.8 1.7 - 7.7 K/uL   Lymphocytes Relative 22 %   Lymphs Abs 1.2 0.7 - 4.0 K/uL   Monocytes Relative 5 %   Monocytes Absolute 0.3 0.1 - 1.0 K/uL   Eosinophils Relative 2 %   Eosinophils Absolute 0.1 0.0 - 0.5 K/uL   Basophils Relative 1 %   Basophils Absolute 0.0 0.0 - 0.1 K/uL   Immature Granulocytes 1 %   Abs Immature Granulocytes 0.04 0.00 - 0.07 K/uL    Comment: Performed at [facility] [address] [place], [place] [address]  Comprehensive metabolic panel     Status: Abnormal   Collection Time: 05/18/19  5:43 PM  Result Value Ref Range   Sodium 137 135 - 145 mmol/L   Potassium 3.7  3.5 - 5.1 mmol/L   Chloride 100 98 - 111 mmol/L   CO2 26 22 - 32 mmol/L   Glucose, Bld 117 (H) 70 - 99 mg/dL   BUN 11 6 - 20 mg/dL   Creatinine, Ser 0.65 0.44 - 1.00 mg/dL   Calcium 8.7 (L) 8.9 - 10.3 mg/dL   Total Protein 6.6 6.5 - 8.1 g/dL   Albumin 3.5 3.5 - 5.0 g/dL   AST 49 (H) 15 - 41 U/L   ALT 36 0 - 44 U/L   Alkaline Phosphatase 70 38 - 126 U/L   Total Bilirubin 0.6 0.3 - 1.2 mg/dL   GFR calc non Af Amer >60 >60 mL/min   GFR calc Af Amer >60 >60 mL/min   Anion gap 11 5 - 15    Comment: Performed at [facility] [address], [place], [place] [address]  D-dimer, quantitative     Status: None   Collection Time: 05/18/19  5:43 PM  Result Value Ref Range   D-Dimer, Quant 0.39 0.00 - 0.50 ug/mL-FEU    Comment: (NOTE) At the manufacturer cut-off of 0.50 ug/mL FEU, this assay has been documented to exclude PE with a sensitivity and negative predictive value of 97 to 99%.  At this time, this assay has not been approved by the FDA to exclude DVT/VTE. Results should be correlated with clinical presentation. Performed at [facility], [place] [address], [place], [place] [address]   Procalcitonin     Status: None   Collection Time: 05/18/19  5:43 PM  Result Value Ref Range   Procalcitonin <0.10 ng/mL    Comment:        Interpretation: PCT (Procalcitonin) <= 0.5 ng/mL: Systemic infection (sepsis) is not likely. Local bacterial infection is possible. REPEATED TO VERIFY (NOTE)       Sepsis PCT Algorithm           Lower Respiratory Tract                                      Infection PCT Algorithm    ----------------------------     ----------------------------         PCT < 0.25 ng/mL                PCT < 0.10 ng/mL         Strongly encourage  Strongly discourage   discontinuation of antibiotics    initiation of antibiotics    ----------------------------     -----------------------------       PCT 0.25 - 0.50 ng/mL            PCT 0.10 - 0.25 ng/mL                OR       >80% decrease in PCT            Discourage initiation of                                            antibiotics      Encourage discontinuation           of antibiotics    ----------------------------     -----------------------------         PCT >= 0.50 ng/mL              PCT 0.26 - 0.50 ng/mL                AND       <80% decrease in PCT             Encourage initiation of                                             antibiotics       Encourage continuation           of antibiotics    ----------------------------     -----------------------------        PCT >= 0.50 ng/mL                  PCT > 0.50 ng/mL               AND         increase in PCT                  Strongly encourage                                      initiation of antibiotics    Strongly encourage escalation           of antibiotics                                     -----------------------------                                           PCT <= 0.25 ng/mL                                                 OR                                        >  80% decrease in PCT                                     Discontinue / Do not initiate                                             antibiotics Performed at [facility], [place] [address] [place], [place] [address]   Lactate dehydrogenase     Status: Abnormal   Collection Time: 05/18/19  5:43 PM  Result Value Ref Range   LDH 306 (H) 98 - 192 U/L    Comment: Performed at [facility], [place] [address], [place], [place] [address]  Ferritin     Status: None   Collection Time: 05/18/19  5:43 PM  Result Value Ref Range   Ferritin 52 11 - 307 ng/mL    Comment: Performed at [facility] [address] [place], [place] [address]  Triglycerides     Status: None   Collection Time: 05/18/19  5:43 PM  Result Value Ref Range   Triglycerides 126 <150 mg/dL    Comment: Performed at [facility] [address] [place], [place] [address]   Fibrinogen     Status: Abnormal   Collection Time: 05/18/19  5:43 PM  Result Value Ref Range   Fibrinogen 635 (H) 210 - 475 mg/dL    Comment: Performed at [facility] [address] [place], [place] [address]  C-reactive protein     Status: Abnormal   Collection Time: 05/18/19  5:43 PM  Result Value Ref Range   CRP 14.6 (H) <1.0 mg/dL    Comment: Performed at [facility] [address], [place], [place] [address]  POC SARS Coronavirus 2 Ag-ED - Nasal Swab (BD Veritor Kit)     Status: Abnormal   Collection Time: 05/18/19  6:04 PM  Result Value Ref Range   SARS Coronavirus 2 Ag POSITIVE (A) NEGATIVE    Comment: (NOTE) SARS-CoV-2 antigen PRESENT. Positive results indicate the presence of viral antigens, but clinical correlation with patient history and other diagnostic information is necessary to determine patient infection status.  Positive results do not rule out bacterial infection or co-infection  with other viruses. False positive results are rare but can occur, and confirmatory RT-PCR testing may be appropriate in some circumstances. The expected result is Negative. Fact Sheet for Patients: PodPark.tn Fact Sheet for Providers: GiftContent.is  This test is not yet approved or cleared by the [place] FDA and  has been authorized for detection and/or diagnosis of SARS-CoV-2 by FDA under an Emergency Use Authorization (EUA).  This EUA will remain in effect (meaning this test can be used) for the duration of  the COVID-19 declaration under Section 564(b)(1) of the Act, 21 U.S.C. section 360bbb-3(b)(1), unless the a uthorization is terminated or revoked sooner.   I-Stat beta hCG blood, ED     Status: None   Collection Time: 05/18/19  6:26 PM  Result Value Ref Range   I-stat hCG, quantitative <5.0 <5 mIU/mL   Comment 3            Comment:   GEST. AGE      CONC.  (mIU/mL)   <=1 WEEK  5 - 50      2 WEEKS       50 - 500     3 WEEKS       100 - 10,000     4 WEEKS     1,000 - 30,000        FEMALE AND NON-PREGNANT FEMALE:     LESS THAN 5 mIU/mL   I-Stat beta hCG blood, ED ([facility], [facility], [facility] only)     Status: None   Collection Time: 05/18/19  6:46 PM  Result Value Ref Range   I-stat hCG, quantitative <5.0 <5 mIU/mL   Comment 3            Comment:   GEST. AGE      CONC.  (mIU/mL)   <=1 WEEK        5 - 50     2 WEEKS       50 - 500     3 WEEKS       100 - 10,000     4 WEEKS     1,000 - 30,000        FEMALE AND NON-PREGNANT FEMALE:     LESS THAN 5 mIU/mL   ABO/Rh     Status: None   Collection Time: 05/19/19  1:24 AM  Result Value Ref Range   ABO/RH(D)      A POS Performed at [facility], [place] [address], [place], [place] [address]   HIV Antibody (routine testing w rflx)     Status: None   Collection Time: 05/19/19  1:34 AM  Result Value Ref Range   HIV Screen 4th Generation wRfx NON REACTIVE NON REACTIVE    Comment: Performed at [facility] [address] [place], [place] [address]   DG Chest 1 View  Result Date: 05/18/2019 CLINICAL DATA:  Cough and shortness of breath. EXAM: CHEST  1 VIEW COMPARISON:  None. FINDINGS: There are slightly prominent interstitial lung markings bilaterally with subtle airspace opacities for example in the right lower and left upper lung zones. The heart size is normal. There is no acute osseous abnormality. There is no pneumothorax or significant pleural effusion. IMPRESSION: Subtle hazy bilateral airspace opacities which can be seen in patients with viral pneumonia. Electronically Signed   By: Constance Holster M.D.   On: 05/18/2019 20:24    Pending Labs Unresulted Labs (From admission, onward)    Start     Ordered   05/20/19 0500  CBC with Differential/Platelet  Daily,   R     05/19/19 0128   05/20/19 0500  Comprehensive metabolic panel  Daily,   R     05/19/19 0128   05/20/19 0500  C-reactive protein  Daily,   R     05/19/19 0128    05/20/19 0500  D-dimer, quantitative (not at [facility])  Daily,   R     05/19/19 0128   05/20/19 0500  Fibrinogen  Daily,   R     05/19/19 1141   05/20/19 0500  Lactate dehydrogenase  Daily,   R     05/19/19 1141   05/20/19 0500  Ferritin  Daily,   R     05/19/19 1141   05/18/19 1728  Lactic acid, plasma  Now then every 2 hours,   STAT     05/18/19 1727   05/18/19 1728  Blood Culture (routine x 2)  BLOOD CULTURE X 2,   STAT  05/18/19 1727          Vitals/Pain Today's Vitals   05/19/19 1000 05/19/19 1039 05/19/19 1130 05/19/19 1145  BP: 119/70 125/68 123/77 121/77  Pulse: 77 83 78 72  Resp: (!) 26 19 (!) 32 (!) 22  Temp:      TempSrc:      SpO2: 95% 92% 98% 94%  PainSc:        Isolation Precautions Airborne and Contact precautions  Medications Medications  enoxaparin (LOVENOX) injection 40 mg (40 mg Subcutaneous Given 05/19/19 1036)  remdesivir 200 mg in sodium chloride 0.9% 250 mL IVPB (0 mg Intravenous Stopped 05/19/19 0356)    Followed by  remdesivir 100 mg in sodium chloride 0.9 % 100 mL IVPB (has no administration in time range)  acetaminophen (TYLENOL) tablet 650 mg (has no administration in time range)  ondansetron (ZOFRAN) tablet 4 mg (has no administration in time range)    Or  ondansetron (ZOFRAN) injection 4 mg (has no administration in time range)  guaiFENesin-dextromethorphan (ROBITUSSIN DM) 100-10 MG/5ML syrup 5 mL (5 mLs Oral Given 05/19/19 0601)  benzonatate (TESSALON) capsule 200 mg (has no administration in time range)  chlorpheniramine-HYDROcodone (TUSSIONEX) 10-8 MG/5ML suspension 5 mL (5 mLs Oral Given 05/19/19 1036)  dexamethasone (DECADRON) injection 6 mg (6 mg Intravenous Given 05/19/19 1158)  albuterol (VENTOLIN HFA) 108 (90 Base) MCG/ACT inhaler 2 puff (2 puffs Inhalation Given 05/19/19 1158)  ipratropium (ATROVENT HFA) inhaler 2 puff (2 puffs Inhalation Given 05/19/19 1201)  ascorbic acid (VITAMIN C) tablet 500 mg (500 mg Oral Given 05/19/19 1159)  Vitamin D3  (Vitamin D) tablet 1,000 Units (1,000 Units Oral Given 05/19/19 1159)  zinc sulfate capsule 220 mg (220 mg Oral Given 05/19/19 1159)    Mobility walks Low fall risk   Focused Assessments Pulmonary Assessment Handoff:  Lung sounds: Bilateral Breath Sounds: Rhonchi L Breath Sounds: Rhonchi R Breath Sounds: Rhonchi O2 Device: Nasal Cannula O2 Flow Rate (L/min): 5 L/min      R Recommendations: See Admitting Provider Note  Report given to:   Additional Notes:

## 2019-05-19 NOTE — ED Notes (Addendum)
Pt used bedside commode. Pt off O2 at that time. SpO2 dropped into the 70's. Pt placed back on O2 at 5 liters and is 90%. Found in tripod position. Pt did not tolerate well.

## 2019-05-19 NOTE — ED Notes (Signed)
Report given to [place], Therapist, sports at [facility]

## 2019-05-19 NOTE — ED Notes (Signed)
Pt coughing and having a hard time with pain due to cough. Albuterol given

## 2019-05-19 NOTE — ED Provider Notes (Signed)
[facility] EMERGENCY DEPARTMENT Provider Note   CSN: [id number] Arrival date & time: 05/18/19  1638     History Chief Complaint  Patient presents with  . Cough  . COVID+    Gina Conner is a 54 y.o. female.  Patient is a 54 year old female with history of Gastric Bypass, Hypothyroidism presenting with complaints of chest congestion, cough, and shortness of breath.  Her symptoms began 1 week ago and are worsening.  She was seen by her primary doctor 1 week ago and had a positive Covid test.  Her breathing worsened this evening and she was noted to have oxygen saturations of 85% on her home pulse ox and presents for evaluation of this.  She was 89% upon arrival to the ED.  The history is provided by the patient.  Cough Cough characteristics:  Non-productive Severity:  Moderate Onset quality:  Gradual Duration:  1 week Timing:  Constant Progression:  Worsening Chronicity:  New Relieved by:  Nothing Worsened by:  Nothing Ineffective treatments:  Decongestant and beta-agonist inhaler (Prednisone) Associated symptoms: chest pain and fever        Past Medical History:  Diagnosis Date  . Hypothyroidism     Patient Active Problem List   Diagnosis Date Noted  . 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 ABDOMINAL HYSTERECTOMY    . EXCISION/RELEASE BURSA HIP Right 02/06/2017   Procedure: Right hip bursectomy;  Surgeon: Gaynelle Arabian, MD;  [facility]: [facility];  Service: Orthopedics;  Laterality: Right;  . ROUX-EN-Y GASTRIC BYPASS  2002     OB History   No obstetric history on file.     No family history on file.  Social History   Tobacco Use  . Smoking status: Never Smoker  . Smokeless tobacco: Never Used  Substance Use Topics  . Alcohol use: No    Comment: occ  . Drug use: No    Home Medications Prior to Admission medications   Medication Sig Start Date End Date Taking?  Authorizing Provider  levothyroxine (SYNTHROID, LEVOTHROID) 50 MCG tablet Take 50 mcg by mouth daily before breakfast.    Yes [provider]  acetaminophen (TYLENOL) 500 MG tablet Take 1,000 mg by mouth every 8 (eight) hours as needed for mild pain or headache.    [provider]  Cholecalciferol (D 5000) 5000 units capsule Take 5,000 Units by mouth daily.    [provider]  Iron, Ferrous Gluconate, 256 (28 Fe) MG TABS Take 1 tablet by mouth daily.    [provider]  methocarbamol (ROBAXIN) 500 MG tablet Take 1 tablet (500 mg total) by mouth every 6 (six) hours as needed for muscle spasms. 02/07/17   Perkins, Alexzandrew L, PA-C  ondansetron (ZOFRAN) 4 MG tablet Take 1 tablet (4 mg total) by mouth every 6 (six) hours as needed for nausea. 02/07/17   Perkins, Alexzandrew L, PA-C  oxyCODONE (OXY IR/ROXICODONE) 5 MG immediate release tablet Take 1-2 tablets (5-10 mg total) by mouth every 4 (four) hours as needed for moderate pain or severe pain. 02/07/17   Perkins, Alexzandrew L, PA-C    Allergies    Patient has no known allergies.  Review of Systems   Review of Systems  Constitutional: Positive for fever.  Respiratory: Positive for cough.   Cardiovascular: Positive for chest pain.  All other systems reviewed and are negative.   Physical Exam Updated Vital Signs BP (!) 154/127 (BP  Location: Left Arm)   Pulse 92   Temp 98.8 F (37.1 C) (Oral)   Resp 17   SpO2 98%   Physical Exam Vitals and nursing note reviewed.  Constitutional:      General: She is not in acute distress.    Appearance: She is well-developed. She is not diaphoretic.  HENT:     Head: Normocephalic and atraumatic.     Mouth/Throat:     Mouth: Mucous membranes are moist.     Pharynx: No oropharyngeal exudate or posterior oropharyngeal erythema.  Cardiovascular:     Rate and Rhythm: Normal rate and regular rhythm.     Heart sounds: No murmur. No friction rub. No gallop.    Pulmonary:     Effort: Pulmonary effort is normal. No respiratory distress.     Breath sounds: Rhonchi present. No wheezing.     Comments: There are scattered rhonchi bilaterally. Abdominal:     General: Bowel sounds are normal. There is no distension.     Palpations: Abdomen is soft.     Tenderness: There is no abdominal tenderness.  Musculoskeletal:        General: No swelling or tenderness. Normal range of motion.     Cervical back: Normal range of motion and neck supple.     Right lower leg: No edema.     Left lower leg: No edema.  Skin:    General: Skin is warm and dry.  Neurological:     Mental Status: She is alert and oriented to person, place, and time.     ED Results / Procedures / Treatments   Labs (all labs ordered are listed, but only abnormal results are displayed) Labs Reviewed  COMPREHENSIVE METABOLIC PANEL - Abnormal; Notable for the following components:      Result Value   Glucose, Bld 117 (*)    Calcium 8.7 (*)    AST 49 (*)    All other components within normal limits  LACTATE DEHYDROGENASE - Abnormal; Notable for the following components:   LDH 306 (*)    All other components within normal limits  FIBRINOGEN - Abnormal; Notable for the following components:   Fibrinogen 635 (*)    All other components within normal limits  C-REACTIVE PROTEIN - Abnormal; Notable for the following components:   CRP 14.6 (*)    All other components within normal limits  POC SARS CORONAVIRUS 2 AG -  ED - Abnormal; Notable for the following components:   SARS Coronavirus 2 Ag POSITIVE (*)    All other components within normal limits  CULTURE, BLOOD (ROUTINE X 2)  CULTURE, BLOOD (ROUTINE X 2)  LACTIC ACID, PLASMA  CBC WITH DIFFERENTIAL/PLATELET  D-DIMER, QUANTITATIVE (NOT AT [facility])  PROCALCITONIN  FERRITIN  TRIGLYCERIDES  LACTIC ACID, PLASMA  I-STAT BETA HCG BLOOD, ED ([facility], [facility], [facility] ONLY)  I-STAT BETA HCG BLOOD, ED ([facility], [facility], [facility] ONLY)    EKG EKG Interpretation   Date/Time:  Monday May 18 2019 17:19:59 EST Ventricular Rate:  101 PR Interval:  128 QRS Duration: 76 QT Interval:  348 QTC Calculation: 451 R Axis:   -63 Text Interpretation: Sinus tachycardia Left axis deviation Low voltage QRS Possible Anterolateral infarct , age undetermined Abnormal ECG Confirmed by Veryl Speak [phone]) on 05/19/2019 3:17:51 AM   Radiology DG Chest 1 View  Result Date: 05/18/2019 CLINICAL DATA:  Cough and shortness of breath. EXAM: CHEST  1 VIEW COMPARISON:  None. FINDINGS: There are slightly prominent interstitial lung markings bilaterally with  subtle airspace opacities for example in the right lower and left upper lung zones. The heart size is normal. There is no acute osseous abnormality. There is no pneumothorax or significant pleural effusion. IMPRESSION: Subtle hazy bilateral airspace opacities which can be seen in patients with viral pneumonia. Electronically Signed   By: Constance Holster M.D.   On: 05/18/2019 20:24    Procedures Procedures (including critical care time)  Medications Ordered in ED Medications - No data to display  ED Course  I have reviewed the triage vital signs and the nursing notes.  Pertinent labs & imaging results that were available during my care of the patient were reviewed by me and considered in my medical decision making (see chart for details).    MDM Rules/Calculators/A&P  Patient presenting here with complaints of shortness of breath and chest tightness.  She has tested positive today for COVID-19.  She is hypoxic with oxygen saturations dropping into the mid 80s on room air.  Patient will be admitted to the hospitalist service for oxygen hand further care.  I have spoken with Dr. Alcario Drought who agrees to admit.  CRITICAL CARE Performed by: Veryl Speak Total critical care time: 35 minutes Critical care time was exclusive of separately billable procedures and treating other patients. Critical care was necessary to  treat or prevent imminent or life-threatening deterioration. Critical care was time spent personally by me on the following activities: development of treatment plan with patient and/or surrogate as well as nursing, discussions with consultants, evaluation of patient's response to treatment, examination of patient, obtaining history from patient or surrogate, ordering and performing treatments and interventions, ordering and review of laboratory studies, ordering and review of radiographic studies, pulse oximetry and re-evaluation of patient's condition.  EDWENA GHAN was evaluated in Emergency Department on 05/19/2019 for the symptoms described in the history of present illness. She was evaluated in the context of the global COVID-19 pandemic, which necessitated consideration that the patient might be at risk for infection with the SARS-CoV-2 virus that causes COVID-19. Institutional protocols and algorithms that pertain to the evaluation of patients at risk for COVID-19 are in a state of rapid change based on information released by regulatory bodies including the CDC and federal and state organizations. These policies and algorithms were followed during the patient's care in the ED.  Final Clinical Impression(s) / ED Diagnoses Final diagnoses:  Dyspnea    Rx / DC Orders ED Discharge Orders    None       Veryl Speak, MD 05/19/19 [phone]

## 2019-05-19 NOTE — ED Notes (Signed)
Patient updated on plan of care, aware of bed status. Switched to hospital bed for comfort.

## 2019-05-19 NOTE — H&P (Signed)
History and Physical    Gina Conner [id number] DOB: [birth year] DOA: 05/18/2019  PCP: Manon Hilding, MD  Patient coming from: Home  I have personally briefly reviewed patient's old medical records in [place]  Chief Complaint: SOB  HPI: Gina Conner is a 54 y.o. female with medical history significant of hypothyroidism.  Patient with symptoms onset 12/28, tested COVID positive 12/29.  Patient presents to ED with increased cough, dyspnea, hypoxia on home pulse ox.  Seen by PCP given albuterol inhaler & prednisone 40 mg daily for 5 days- completed yesterday with some improvement but then seemed to worse with new SpO2 in the mid to upper 80s on her at home pulse oximeter which prompted ED visit. Denies fever- has been taking tylenol. Denies chest pain or syncope.   ED Course: COVID positive.  CXR shows subtle B opacities.  WBC nl, procalcitonin negative, CRP 14.6, D.Dimer 0.39.  Satting 94% on RA at rest, desats to 84% with ambulation though.   Review of Systems: As per HPI, otherwise all review of systems negative.  Past Medical History:  Diagnosis Date  . Hypothyroidism     Past Surgical History:  Procedure Laterality Date  . ABDOMINAL HYSTERECTOMY    . EXCISION/RELEASE BURSA HIP Right 02/06/2017   Procedure: Right hip bursectomy;  Surgeon: Gaynelle Arabian, MD;  Location: [facility];  Service: Orthopedics;  Laterality: Right;  . ROUX-EN-Y GASTRIC BYPASS  2002     reports that she has never smoked. She has never used smokeless tobacco. She reports that she does not drink alcohol or use drugs.  No Known Allergies  No family history on file. No reported sick contacts  Prior to Admission medications   Medication Sig Start Date End Date Taking? Authorizing Provider  levothyroxine (SYNTHROID, LEVOTHROID) 50 MCG tablet Take 50 mcg by mouth daily before breakfast.    Yes [provider]  acetaminophen (TYLENOL) 500 MG tablet Take 1,000 mg by mouth  every 8 (eight) hours as needed for mild pain or headache.    [provider]  Cholecalciferol (D 5000) 5000 units capsule Take 5,000 Units by mouth daily.    [provider]  Iron, Ferrous Gluconate, 256 (28 Fe) MG TABS Take 1 tablet by mouth daily.    [provider]  methocarbamol (ROBAXIN) 500 MG tablet Take 1 tablet (500 mg total) by mouth every 6 (six) hours as needed for muscle spasms. 02/07/17   Perkins, Alexzandrew L, PA-C  ondansetron (ZOFRAN) 4 MG tablet Take 1 tablet (4 mg total) by mouth every 6 (six) hours as needed for nausea. 02/07/17   Perkins, Alexzandrew L, PA-C  oxyCODONE (OXY IR/ROXICODONE) 5 MG immediate release tablet Take 1-2 tablets (5-10 mg total) by mouth every 4 (four) hours as needed for moderate pain or severe pain. 02/07/17   Dara Lords, Alexzandrew L, PA-C    Physical Exam: Vitals:   05/18/19 1718 05/18/19 2151 05/19/19 0100 05/19/19 0133  BP: 135/89 (!) 154/127 129/80 (!) 113/59  Pulse: 96 92 93 94  Resp: 18 17 (!) 21 (!) 22  Temp: 99.6 F (37.6 C) 98.8 F (37.1 C)    TempSrc: Oral Oral    SpO2: 100% 98% 95% 97%    Constitutional: NAD, calm, comfortable Eyes: PERRL, lids and conjunctivae normal ENMT: Mucous membranes are moist. Posterior pharynx clear of any exudate or lesions.Normal dentition.  Neck: normal, supple, no masses, no thyromegaly Respiratory: clear to auscultation bilaterally, no wheezing, no crackles. Normal respiratory effort.  No accessory muscle use.  Cardiovascular: Regular rate and rhythm, no murmurs / rubs / gallops. No extremity edema. 2+ pedal pulses. No carotid bruits.  Abdomen: no tenderness, no masses palpated. No hepatosplenomegaly. Bowel sounds positive.  Musculoskeletal: no clubbing / cyanosis. No joint deformity upper and lower extremities. Good ROM, no contractures. Normal muscle tone.  Skin: no rashes, lesions, ulcers. No induration Neurologic: CN 2-12 grossly intact. Sensation intact, DTR normal.  Strength 5/5 in all 4.  Psychiatric: Normal judgment and insight. Alert and oriented x 3. Normal mood.    Labs on Admission: I have personally reviewed following labs and imaging studies  CBC: Recent Labs  Lab 05/18/19 1743  WBC 5.4  NEUTROABS 3.8  HGB 14.9  HCT 46.0  MCV 94.5  PLT [id number]   Basic Metabolic Panel: Recent Labs  Lab 05/18/19 1743  NA 137  K 3.7  CL 100  CO2 26  GLUCOSE 117*  BUN 11  CREATININE 0.65  CALCIUM 8.7*   GFR: CrCl cannot be calculated (Unknown ideal weight.). Liver Function Tests: Recent Labs  Lab 05/18/19 1743  AST 49*  ALT 36  ALKPHOS 70  BILITOT 0.6  PROT 6.6  ALBUMIN 3.5   No results for input(s): LIPASE, AMYLASE in the last 168 hours. No results for input(s): AMMONIA in the last 168 hours. Coagulation Profile: No results for input(s): INR, PROTIME in the last 168 hours. Cardiac Enzymes: No results for input(s): CKTOTAL, CKMB, CKMBINDEX, TROPONINI in the last 168 hours. BNP (last 3 results) No results for input(s): PROBNP in the last 8760 hours. HbA1C: No results for input(s): HGBA1C in the last 72 hours. CBG: No results for input(s): GLUCAP in the last 168 hours. Lipid Profile: Recent Labs    05/18/19 1743  TRIG 126   Thyroid Function Tests: No results for input(s): TSH, T4TOTAL, FREET4, T3FREE, THYROIDAB in the last 72 hours. Anemia Panel: Recent Labs    05/18/19 1743  FERRITIN 52   Urine analysis: No results found for: COLORURINE, APPEARANCEUR, LABSPEC, PHURINE, GLUCOSEU, HGBUR, BILIRUBINUR, KETONESUR, PROTEINUR, UROBILINOGEN, NITRITE, LEUKOCYTESUR  Radiological Exams on Admission: DG Chest 1 View  Result Date: 05/18/2019 CLINICAL DATA:  Cough and shortness of breath. EXAM: CHEST  1 VIEW COMPARISON:  None. FINDINGS: There are slightly prominent interstitial lung markings bilaterally with subtle airspace opacities for example in the right lower and left upper lung zones. The heart size is normal. There is no acute  osseous abnormality. There is no pneumothorax or significant pleural effusion. IMPRESSION: Subtle hazy bilateral airspace opacities which can be seen in patients with viral pneumonia. Electronically Signed   By: Constance Holster M.D.   On: 05/18/2019 20:24    EKG: Independently reviewed.  Assessment/Plan Principal Problem:   Acute hypoxemic respiratory failure due to COVID-19 [facility]) Active Problems:   Hypothyroidism    1. COVID-19 with new O2 requirement - 1. COVID pathway 2. remdesivir 3. Decadron 4. Only 2L requirement, will hold off on actemra for the moment 5. Procalcitonin neg and WBC nl 6. Daily labs 7. Cont pulse ox 2. H/o hypothyroidism - 1. Continue synthroid when med rec completed  DVT prophylaxis: Lovenox Code Status: Full Family Communication: No family in room Disposition Plan: Home after admit Consults called: None Admission status: Admit to inpatient  Severity of Illness: The appropriate patient status for this patient is INPATIENT. Inpatient status is judged to be reasonable and necessary in order to provide the required intensity of service to ensure the patient's safety. The patient's  presenting symptoms, physical exam findings, and initial radiographic and laboratory data in the context of their chronic comorbidities is felt to place them at high risk for further clinical deterioration. Furthermore, it is not anticipated that the patient will be medically stable for discharge from the hospital within 2 midnights of admission. The following factors support the patient status of inpatient.   IP status due to new O2 requirement with COVID  * I certify that at the point of admission it is my clinical judgment that the patient will require inpatient hospital care spanning beyond 2 midnights from the point of admission due to high intensity of service, high risk for further deterioration and high frequency of surveillance required.*    Sharlee Rufino M. DO [facility]  How to contact the [facility] Attending or Consulting provider [place] or covering provider during after hours [place], for this patient?  1. Check the care team in [facility] and look for a) attending/consulting [facility] provider listed and b) the [facility] team listed 2. Log into [email or web address]  Amion Physician Scheduling and messaging for groups and whole hospitals  On call and physician scheduling software for group practices, residents, hospitalists and other medical providers for call, clinic, rotation and shift schedules. On[employer] is a hospital-wide system for scheduling doctors and paging doctors on call. EasyPlot is for scientific plotting and data analysis.  [email or web address]  and use 's universal password to access. If you do not have the password, please contact the hospital operator.  3. Locate the [facility] provider you are looking for under [facility] and page to a number that you can be directly reached. 4. If you still have difficulty reaching the provider, please page the [facility] (Director on Call) for the Hospitalists listed on amion for assistance.  05/19/2019, 3:01 AM

## 2019-05-19 NOTE — ED Notes (Signed)
PTAR (Transfer to [facility]) called @ 1310-per Roselyn Reef, RN called by Enzo Montgomery Transfer to [facility] called/canceled @1311 .

## 2019-05-19 NOTE — Progress Notes (Signed)
Gina Conner is a 54 y.o. female with medical history significant of hypothyroidism and obesity.  Patient with symptoms onset 12/28, tested COVID positive 05/12/19.  Presents to ED with increased cough, dyspnea, and hypoxia.  Currently requiring 5 L nasal cannula to maintain O2 sat greater than 90%.  Repeat COVID-19 in-house test positive on 05/18/2019.  Chest x-ray bilateral opacities.  Started on COVID-19 directed therapies.  05/19/19: Seen and examined in the ED.  Persistent cough noted on exam.  Reports pleuritic pain worse with cough.  Started on Tussionex twice daily x3 days.  On remdesivir.  Switched to IV Decadron.  Start bronchodilators albuterol inhaler 2 puffs 3 times daily, ipratropium inhaler 2 puffs 3 times daily.  Add incentive spirometer and flutter valve.  Procalcitonin is negative.  Does not appear volume overload on exam.  Add vitamin D3, C, zinc.  Maintain O2 saturation greater than 92%.  Please refer to H&P dictated by my partner Dr. Alcario Drought on 05/19/2019 for further details of the assessment and plan.  We will transfer the patient to [facility] to continue care.

## 2019-05-19 NOTE — ED Notes (Signed)
Lunch Tray Ordered @ 1132.  

## 2019-05-19 NOTE — ED Notes (Signed)
Pt is NSR on monitor 

## 2019-05-20 LAB — COMPREHENSIVE METABOLIC PANEL
ALT: 27 U/L (ref 0–44)
AST: 32 U/L (ref 15–41)
Albumin: 3.5 g/dL (ref 3.5–5.0)
Alkaline Phosphatase: 65 U/L (ref 38–126)
Anion gap: 12 (ref 5–15)
BUN: 21 mg/dL — ABNORMAL HIGH (ref 6–20)
CO2: 28 mmol/L (ref 22–32)
Calcium: 8.4 mg/dL — ABNORMAL LOW (ref 8.9–10.3)
Chloride: 100 mmol/L (ref 98–111)
Creatinine, Ser: 0.49 mg/dL (ref 0.44–1.00)
GFR calc Af Amer: >60 mL/min (ref >60)
GFR calc non Af Amer: >60 mL/min (ref >60)
Glucose, Bld: 171 mg/dL — ABNORMAL HIGH (ref 70–99)
Potassium: 3.8 mmol/L (ref 3.5–5.1)
Sodium: 140 mmol/L (ref 135–145)
Total Bilirubin: 0.7 mg/dL (ref 0.3–1.2)
Total Protein: 6.8 g/dL (ref 6.5–8.1)

## 2019-05-20 LAB — CBC WITH DIFFERENTIAL/PLATELET
Abs Immature Granulocytes: 0.03 10*3/uL (ref 0.00–0.07)
Basophils Absolute: 0 10*3/uL (ref 0.0–0.1)
Basophils Relative: 0 %
Eosinophils Absolute: 0 10*3/uL (ref 0.0–0.5)
Eosinophils Relative: 0 %
HCT: 41.7 % (ref 36.0–46.0)
Hemoglobin: 13.3 g/dL (ref 12.0–15.0)
Immature Granulocytes: 1 %
Lymphocytes Relative: 17 %
Lymphs Abs: 0.6 10*3/uL — ABNORMAL LOW (ref 0.7–4.0)
MCH: 30.3 pg (ref 26.0–34.0)
MCHC: 31.9 g/dL (ref 30.0–36.0)
MCV: 95 fL (ref 80.0–100.0)
Monocytes Absolute: 0.3 10*3/uL (ref 0.1–1.0)
Monocytes Relative: 8 %
Neutro Abs: 2.7 10*3/uL (ref 1.7–7.7)
Neutrophils Relative %: 74 %
Platelets: 301 10*3/uL (ref 150–400)
RBC: 4.39 MIL/uL (ref 3.87–5.11)
RDW: 12.9 % (ref 11.5–15.5)
WBC: 3.6 10*3/uL — ABNORMAL LOW (ref 4.0–10.5)
nRBC: 0 % (ref 0.0–0.2)

## 2019-05-20 LAB — LACTATE DEHYDROGENASE: LDH: 285 U/L — ABNORMAL HIGH (ref 98–192)

## 2019-05-20 LAB — MAGNESIUM: Magnesium: 2.4 mg/dL (ref 1.7–2.4)

## 2019-05-20 LAB — BRAIN NATRIURETIC PEPTIDE: B Natriuretic Peptide: 15.9 pg/mL (ref 0.0–100.0)

## 2019-05-20 LAB — D-DIMER, QUANTITATIVE: D-Dimer, Quant: 0.39 ug/mL-FEU (ref 0.00–0.50)

## 2019-05-20 LAB — C-REACTIVE PROTEIN: CRP: 13.1 mg/dL — ABNORMAL HIGH (ref <1.0)

## 2019-05-20 LAB — ABO/RH: ABO/RH(D): A POS

## 2019-05-20 MED ORDER — LACTATED RINGERS IV SOLN: INTRAVENOUS | Status: AC

## 2019-05-20 MED ORDER — SODIUM CHLORIDE 0.9 % IV SOLN: 12.5000 mg | Freq: Four times a day (QID) | INTRAVENOUS | Status: DC | PRN

## 2019-05-20 MED ORDER — DIPHENHYDRAMINE HCL 50 MG/ML IJ SOLN
12.5000 mg | Freq: Four times a day (QID) | INTRAMUSCULAR | Status: DC | PRN
  Administered 2019-05-20 (×2): 12.5 mg via INTRAVENOUS
  Filled 2019-05-20 (×2): qty 1

## 2019-05-20 MED ORDER — LORAZEPAM 2 MG/ML IJ SOLN
0.5000 mg | Freq: Four times a day (QID) | INTRAMUSCULAR | Status: AC | PRN
  Administered 2019-05-20: 0.5 mg via INTRAVENOUS
  Filled 2019-05-20: qty 1

## 2019-05-20 NOTE — Progress Notes (Signed)
PROGRESS NOTE                                                                                                                                                                                                             Patient Demographics:    Gina Conner, is a 54 y.o. female, DOB - [birth year], XKG:[id number]  Outpatient Primary MD for the patient is Sasser, Silvestre Moment, MD    LOS - 1  Admit date - 05/18/2019    Chief Complaint  Patient presents with  . Cough  . COVID positive       Brief Narrative - 54 year old female with past medical history of hypothyroidism who presented with cough and shortness of breath was diagnosed with acute hypoxic respiratory failure due to COVID-19 pneumonia and admitted to [facility].   Subjective:    Gina Conner today has, No headache, No chest pain, No abdominal pain - No Nausea, No new weakness tingling or numbness, improved shortness of breath, mild anxiety.   Assessment  & Plan :    Principal Problem:   Acute hypoxemic respiratory failure due to COVID-19 [facility]) Active Problems:   Hypothyroidism   Acute respiratory failure due to COVID-19 ([place])   1. Acute Hypoxic Resp. Failure due to Acute Covid 19 Viral Pneumonitis during the ongoing 2020 Covid 19 Pandemic -she has mild to moderate disease, currently on IV steroids and remdesivir and improving.  Will be closely monitored.  Try to advance activity and titrate down oxygen.  Encouraged the patient to sit up in chair in the daytime use I-S and flutter valve for pulmonary toiletry and then prone in bed when at night.   SpO2: 95 % O2 Flow Rate (L/min): 4 L/min  Recent Labs  Lab 05/18/19 1743 05/20/19 0405  CRP 14.6* 13.1*  DDIMER 0.39 0.39  FERRITIN 52  --   BNP 17.6 15.9  PROCALCITON <0.10  --     Hepatic Function Latest Ref Rng & Units 05/20/2019 05/18/2019  Total Protein 6.5 - 8.1 g/dL 6.8 6.6  Albumin 3.5 - 5.0 g/dL 3.5 3.5    AST 15 - 41 U/L 32 49(H)  ALT 0 - 44 U/L 27 36  Alk Phosphatase 38 - 126 U/L 65 70  Total Bilirubin 0.3 - 1.2 mg/dL 0.7 0.6    2.  Mild anxiety.  Low-dose benzo.  3.  Hypothyroidism.  Home dose thyroid supplementation continued.     Condition - Fair  Family Communication  :  None  Code Status :  Full  Diet :   Diet Order            Diet Heart Room service appropriate? Yes; Fluid consistency: Thin  Diet effective now               Disposition Plan  :  Home  Consults  :  None  Procedures  :  None  PUD Prophylaxis : None  DVT Prophylaxis  :  Lovenox   Lab Results  Component Value Date   PLT 301 05/20/2019    Inpatient Medications  Scheduled Meds: . albuterol  2 puff Inhalation Q8H  . vitamin C  500 mg Oral Daily  . cholecalciferol  5,000 Units Oral Daily  . enoxaparin (LOVENOX) injection  0.5 mg/kg Subcutaneous Daily  . ipratropium  2 puff Inhalation Q8H  . methylPREDNISolone (SOLU-MEDROL) injection  60 mg Intravenous Daily  . thyroid  60 mg Oral QAC breakfast  . zinc sulfate  220 mg Oral Daily   Continuous Infusions: . lactated ringers 125 mL/hr at 05/20/19 0741  . remdesivir 100 mg in NS 100 mL 100 mg (05/20/19 0842)   PRN Meds:.acetaminophen, benzonatate, cyclobenzaprine, diphenhydrAMINE, guaiFENesin-dextromethorphan, [DISCONTINUED] ondansetron **OR** ondansetron (ZOFRAN) IV  Antibiotics  :    Anti-infectives (From admission, onward)   Start     Dose/Rate Route Frequency Ordered Stop   05/20/19 1000  remdesivir 100 mg in sodium chloride 0.9 % 100 mL IVPB     100 mg 200 mL/hr over 30 Minutes Intravenous Daily 05/19/19 0128 05/24/19 0959   05/19/19 0145  remdesivir 200 mg in sodium chloride 0.9% 250 mL IVPB     200 mg 580 mL/hr over 30 Minutes Intravenous Once 05/19/19 0128 05/19/19 0356       Time Spent in minutes  30   Lala Lund M.D on 05/20/2019 at 11:30 AM  To page go to [email or web address] - password [facility]  [facility] -   Office  [phone]  See all Orders from today for further details    Objective:   Vitals:   05/19/19 2000 05/20/19 0400 05/20/19 0800 05/20/19 1022  BP: 1'20/78 99/64 97/63 '$   Pulse: 86 76 67 74  Resp: (!) 28 (!) 24    Temp: 97.8 F (36.6 C) 98.2 F (36.8 C)    TempSrc:  Oral Oral   SpO2: 98% 92%  95%  Weight:      Height:        Wt Readings from Last 3 Encounters:  05/19/19 133.8 kg  02/06/17 128.8 kg  01/30/17 129 kg    No intake or output data in the 24 hours ending 05/20/19 1130   Physical Exam  Awake Alert,  No new F.N deficits, anxious affect [place].AT,PERRAL Supple Neck,No JVD, No cervical lymphadenopathy appriciated.  Symmetrical Chest wall movement, Good air movement bilaterally, CTAB RRR,No Gallops,Rubs or new Murmurs, No Parasternal Heave +ve B.Sounds, Abd Soft, No tenderness, No organomegaly appriciated, No rebound - guarding or rigidity. No Cyanosis, Clubbing or edema, No new Rash or bruise     Data Review:    CBC Recent Labs  Lab 05/18/19 1743 05/20/19 0405  WBC 5.4 3.6*  HGB 14.9 13.3  HCT 46.0 41.7  PLT 282 301  MCV 94.5 95.0  MCH 30.6 30.3  MCHC 32.4 31.9  RDW 13.0 12.9  LYMPHSABS 1.2 0.6*  MONOABS 0.3 0.3  EOSABS 0.1 0.0  BASOSABS 0.0 0.0    Chemistries  Recent Labs  Lab 05/18/19 1743 05/20/19 0405  NA 137 140  K 3.7 3.8  CL 100 100  CO2 26 28  GLUCOSE 117* 171*  BUN 11 21*  CREATININE 0.65 0.49  CALCIUM 8.7* 8.4*  MG  --  2.4  AST 49* 32  ALT 36 27  ALKPHOS 70 65  BILITOT 0.6 0.7   ------------------------------------------------------------------------------------------------------------------ Recent Labs    05/18/19 1743  TRIG 126    No results found for: HGBA1C ------------------------------------------------------------------------------------------------------------------ No results for input(s): TSH, T4TOTAL, T3FREE, THYROIDAB in the last 72 hours.  Invalid input(s): FREET3  Cardiac Enzymes No results  for input(s): CKMB, TROPONINI, MYOGLOBIN in the last 168 hours.  Invalid input(s): CK ------------------------------------------------------------------------------------------------------------------    Component Value Date/Time   BNP 15.9 05/20/2019 0405    Micro Results Recent Results (from the past 240 hour(s))  Blood Culture (routine x 2)     Status: None (Preliminary result)   Collection Time: 05/18/19  5:43 PM   Specimen: BLOOD RIGHT ARM  Result Value Ref Range Status   Specimen Description BLOOD RIGHT ARM  Final   Special Requests   Final    BOTTLES DRAWN AEROBIC AND ANAEROBIC Blood Culture adequate volume   Culture   Final    NO GROWTH < 24 HOURS Performed at [facility], [place] [address], [place], [place] [address]    Report Status PENDING  Incomplete    Radiology Reports DG Chest 1 View  Result Date: 05/18/2019 CLINICAL DATA:  Cough and shortness of breath. EXAM: CHEST  1 VIEW COMPARISON:  None. FINDINGS: There are slightly prominent interstitial lung markings bilaterally with subtle airspace opacities for example in the right lower and left upper lung zones. The heart size is normal. There is no acute osseous abnormality. There is no pneumothorax or significant pleural effusion. IMPRESSION: Subtle hazy bilateral airspace opacities which can be seen in patients with viral pneumonia. Electronically Signed   By: Constance Holster M.D.   On: 05/18/2019 20:24

## 2019-05-20 NOTE — Plan of Care (Signed)
Patient remained Aox4, sats in lower 90's on 2LNC, lower with hacking non-productive cough, given zofran once per patient request, also given Tessalon 1x,  able to ambulate to bathroom with supplemental 02, upon second trip to bathroom patient became lightheaded upon standing and stumbled back to bed briefly, sats remained unchanged once return to bed, Has one dose of remdesivir completed, safety precautions maintained, continue poc Problem: Education: Goal: Knowledge of risk factors and measures for prevention of condition will improve Outcome: Progressing   Problem: Coping: Goal: Psychosocial and spiritual needs will be supported Outcome: Progressing   Problem: Respiratory: Goal: Will maintain a patent airway Outcome: Progressing Goal: Complications related to the disease process, condition or treatment will be avoided or minimized Outcome: Progressing   Problem: Education: Goal: Knowledge of General Education information will improve Description: Including pain rating scale, medication(s)/side effects and non-pharmacologic comfort measures Outcome: Progressing   Problem: Health Behavior/Discharge Planning: Goal: Ability to manage health-related needs will improve Outcome: Progressing   Problem: Clinical Measurements: Goal: Ability to maintain clinical measurements within normal limits will improve Outcome: Progressing Goal: Will remain free from infection Outcome: Progressing Goal: Diagnostic test results will improve Outcome: Progressing Goal: Respiratory complications will improve Outcome: Progressing Goal: Cardiovascular complication will be avoided Outcome: Progressing   Problem: Activity: Goal: Risk for activity intolerance will decrease Outcome: Progressing   Problem: Nutrition: Goal: Adequate nutrition will be maintained Outcome: Progressing   Problem: Coping: Goal: Level of anxiety will decrease Outcome: Progressing   Problem: Elimination: Goal: Will not  experience complications related to bowel motility Outcome: Progressing Goal: Will not experience complications related to urinary retention Outcome: Progressing   Problem: Pain Managment: Goal: General experience of comfort will improve Outcome: Progressing   Problem: Safety: Goal: Ability to remain free from injury will improve Outcome: Progressing   Problem: Skin Integrity: Goal: Risk for impaired skin integrity will decrease Outcome: Progressing

## 2019-05-20 NOTE — Evaluation (Signed)
Physical Therapy Evaluation Patient Details Name: Gina Conner MRN: [id number] DOB: October 15, 1965 Today's Date: 05/20/2019   History of Present Illness  54 year old female admitted with acute hypoxemic respiratory failure due to COVID 75, PMH includes obesity and hypothryroidism.  Clinical Impression  Patient presents as generally weak, and previously (PLOF) was totally independent including having retired from the [employer]. She was on 4 HFNC during PT session, and SPO2 varied between (88% following ambulation to bathroom) to recovery > 90% in 3 minutes. She resides in one level home with 3 outside steps and lives with spouse and 19 year old son. She is very receptive to PT and should benefit from skilled PT to address goals as outlined.     Follow Up Recommendations Home health PT    Equipment Recommendations  None recommended by PT    Recommendations for Other Services       Precautions / Restrictions Precautions Precautions: Fall Restrictions Weight Bearing Restrictions: No      Mobility  Bed Mobility Overal bed mobility: Modified Independent             General bed mobility comments: handrails  Transfers Overall transfer level: Modified independent Equipment used: 1 person hand held assist             General transfer comment: pushes up on arm of chair  Ambulation/Gait Ambulation/Gait assistance: Min guard(ambulated to and from bathroom) Gait Distance (Feet): 20 Feet Assistive device: None Gait Pattern/deviations: Shuffle;Wide base of support        Stairs            Wheelchair Mobility    Modified Rankin (Stroke Patients Only)       Balance Overall balance assessment: Mild deficits observed, not formally tested(states that she "just feels very tired and weak")                                           Pertinent Vitals/Pain Pain Assessment: No/denies pain    Home Living Family/patient expects to be discharged  to:: Private residence Living Arrangements: Spouse/significant other Available Help at Discharge: Family;Available 24 hours/day Type of Home: House Home Access: Stairs to enter Entrance Stairs-Rails: Right Entrance Stairs-Number of Steps: 3 Home Layout: One level Home Equipment: None      Prior Function Level of Independence: Independent               Hand Dominance   Dominant Hand: Right    Extremity/Trunk Assessment        Lower Extremity Assessment Lower Extremity Assessment: Defer to PT evaluation;Generalized weakness    Cervical / Trunk Assessment Cervical / Trunk Assessment: Normal  Communication   Communication: No difficulties  Cognition Arousal/Alertness: Awake/alert Behavior During Therapy: WFL for tasks assessed/performed Overall Cognitive Status: Within Functional Limits for tasks assessed                                        General Comments General comments (skin integrity, edema, etc.): Fair tone and turgor; no edema noted    Exercises Total Joint Exercises Ankle Circles/Pumps: AROM;Seated;Right;Left Quad Sets: AROM;Seated;Right;Left Hip ABduction/ADduction: AROM;Seated Other Exercises Other Exercises: Practiced IS and able to achieve 100, 8 out of 10 reps. Good return demo with "Flutter"   Assessment/Plan    PT  Assessment Patient needs continued PT services  PT Problem List Decreased strength;Decreased activity tolerance;Cardiopulmonary status limiting activity;Decreased mobility       PT Treatment Interventions Gait training;Functional mobility training;Patient/family education;Therapeutic exercise;Therapeutic activities    PT Goals (Current goals can be found in the Care Plan section)  Acute Rehab PT Goals Patient Stated Goal: She wants to return to home as independent as possible as in PLOF- she wants to be able to walk specifc distance and surfaces in neighborhood, including inclines and declines PT Goal  Formulation: With patient Time For Goal Achievement: 06/03/19 Potential to Achieve Goals: Good    Frequency Min 3X/week   Barriers to discharge        Co-evaluation               AM-PAC PT "6 Clicks" Mobility  Outcome Measure Help needed turning from your back to your side while in a flat bed without using bedrails?: A Little Help needed moving from lying on your back to sitting on the side of a flat bed without using bedrails?: A Little Help needed moving to and from a bed to a chair (including a wheelchair)?: A Little Help needed standing up from a chair using your arms (e.g., wheelchair or bedside chair)?: A Little Help needed to walk in hospital room?: A Little Help needed climbing 3-5 steps with a railing? : A Lot 6 Click Score: 17    End of Session   Activity Tolerance: Patient tolerated treatment well;Patient limited by fatigue(SPO2 did fluctuate on 4 LPM, from 87 immediately following going to bathroom- and recovery 3 minutes to > 94%) Patient left: in chair;with call bell/phone within reach   PT Visit Diagnosis: Muscle weakness (generalized) (M62.81);Unsteadiness on feet (R26.81)    Time: 0910-1010 PT Time Calculation (min) (ACUTE ONLY): 60 min   Charges:   PT Evaluation $PT Eval Moderate Complexity: 1 Mod PT Treatments $Gait Training: 8-22 mins $Therapeutic Exercise: 8-22 mins $Therapeutic Activity: 23-37 mins        Rollen Sox, PT # [phone] CGV cell   Casandra Doffing 05/20/2019, 10:35 AM

## 2019-05-21 LAB — COMPREHENSIVE METABOLIC PANEL
ALT: 22 U/L (ref 0–44)
AST: 28 U/L (ref 15–41)
Albumin: 3.2 g/dL — ABNORMAL LOW (ref 3.5–5.0)
Alkaline Phosphatase: 53 U/L (ref 38–126)
Anion gap: 10 (ref 5–15)
BUN: 21 mg/dL — ABNORMAL HIGH (ref 6–20)
CO2: 29 mmol/L (ref 22–32)
Calcium: 8.2 mg/dL — ABNORMAL LOW (ref 8.9–10.3)
Chloride: 98 mmol/L (ref 98–111)
Creatinine, Ser: 0.56 mg/dL (ref 0.44–1.00)
GFR calc Af Amer: >60 mL/min (ref >60)
GFR calc non Af Amer: >60 mL/min (ref >60)
Glucose, Bld: 123 mg/dL — ABNORMAL HIGH (ref 70–99)
Potassium: 3.5 mmol/L (ref 3.5–5.1)
Sodium: 137 mmol/L (ref 135–145)
Total Bilirubin: 0.9 mg/dL (ref 0.3–1.2)
Total Protein: 6.3 g/dL — ABNORMAL LOW (ref 6.5–8.1)

## 2019-05-21 LAB — C-REACTIVE PROTEIN: CRP: 5.9 mg/dL — ABNORMAL HIGH (ref <1.0)

## 2019-05-21 LAB — BRAIN NATRIURETIC PEPTIDE: B Natriuretic Peptide: 44.3 pg/mL (ref 0.0–100.0)

## 2019-05-21 LAB — CBC WITH DIFFERENTIAL/PLATELET
Abs Immature Granulocytes: 0.05 10*3/uL (ref 0.00–0.07)
Basophils Absolute: 0 10*3/uL (ref 0.0–0.1)
Basophils Relative: 0 %
Eosinophils Absolute: 0 10*3/uL (ref 0.0–0.5)
Eosinophils Relative: 0 %
HCT: 39.6 % (ref 36.0–46.0)
Hemoglobin: 12.7 g/dL (ref 12.0–15.0)
Immature Granulocytes: 1 %
Lymphocytes Relative: 13 %
Lymphs Abs: 1 10*3/uL (ref 0.7–4.0)
MCH: 30.2 pg (ref 26.0–34.0)
MCHC: 32.1 g/dL (ref 30.0–36.0)
MCV: 94.1 fL (ref 80.0–100.0)
Monocytes Absolute: 0.5 10*3/uL (ref 0.1–1.0)
Monocytes Relative: 6 %
Neutro Abs: 6.4 10*3/uL (ref 1.7–7.7)
Neutrophils Relative %: 80 %
Platelets: 305 10*3/uL (ref 150–400)
RBC: 4.21 MIL/uL (ref 3.87–5.11)
RDW: 12.6 % (ref 11.5–15.5)
WBC: 7.9 10*3/uL (ref 4.0–10.5)
nRBC: 0 % (ref 0.0–0.2)

## 2019-05-21 LAB — LACTATE DEHYDROGENASE: LDH: 285 U/L — ABNORMAL HIGH (ref 98–192)

## 2019-05-21 LAB — D-DIMER, QUANTITATIVE: D-Dimer, Quant: 0.33 ug/mL-FEU (ref 0.00–0.50)

## 2019-05-21 LAB — MAGNESIUM: Magnesium: 2.3 mg/dL (ref 1.7–2.4)

## 2019-05-21 NOTE — Progress Notes (Signed)
CSW completed referral for [facility] has accepted for PT/ OT. Will continue to follow for discharge planning.   Kingsley Spittle, LCSW [facility]  [phone]

## 2019-05-21 NOTE — Progress Notes (Signed)
PROGRESS NOTE                                                                                                                                                                                                             Patient Demographics:    Gina Conner, is a 54 y.o. female, DOB - 1965-10-22, [id number]  Outpatient Primary MD for the patient is Sasser, Silvestre Moment, MD    LOS - 2  Admit date - 05/18/2019    Chief Complaint  Patient presents with  . Cough  . COVID positive       Brief Narrative - 54 year old female with past medical history of hypothyroidism who presented with cough and shortness of breath was diagnosed with acute hypoxic respiratory failure due to COVID-19 pneumonia and admitted to [facility].   Subjective:   Patient in bed, appears comfortable, denies any headache, no fever, no chest pain or pressure, no shortness of breath , no abdominal pain. No focal weakness.    Assessment  & Plan :     1. Acute Hypoxic Resp. Failure due to Acute Covid 19 Viral Pneumonitis during the ongoing 2020 Covid 19 Pandemic -she has mild to moderate disease, currently on IV steroids and remdesivir and improving.  Will be closely monitored.  Try to advance activity and titrate down oxygen.  Encouraged the patient to sit up in chair in the daytime use I-S and flutter valve for pulmonary toiletry and then prone in bed when at night.  SpO2: 92 % O2 Flow Rate (L/min): 3 L/min  Recent Labs  Lab 05/18/19 1743 05/20/19 0405 05/21/19 0040  CRP 14.6* 13.1* 5.9*  DDIMER 0.39 0.39 0.33  FERRITIN 52  --   --   BNP 17.6 15.9 44.3  PROCALCITON <0.10  --   --     Hepatic Function Latest Ref Rng & Units 05/21/2019 05/20/2019 05/18/2019  Total Protein 6.5 - 8.1 g/dL 6.3(L) 6.8 6.6  Albumin 3.5 - 5.0 g/dL 3.2(L) 3.5 3.5  AST 15 - 41 U/L 28 32 49(H)  ALT 0 - 44 U/L 22 27 36  Alk Phosphatase 38 - 126 U/L 53 65 70  Total Bilirubin 0.3 -  1.2 mg/dL 0.9 0.7 0.6    2.  Mild anxiety.  Low-dose benzo.  3.  Hypothyroidism.  Home dose thyroid supplementation continued.  Condition - Fair  Family Communication  :  None  Code Status :  Full  Diet :   Diet Order            Diet Heart Room service appropriate? Yes; Fluid consistency: Thin  Diet effective now               Disposition Plan  :  Home  Consults  :  None  Procedures  :  None  PUD Prophylaxis : None  DVT Prophylaxis  :  Lovenox   Lab Results  Component Value Date   PLT 305 05/21/2019    Inpatient Medications  Scheduled Meds: . albuterol  2 puff Inhalation Q8H  . vitamin C  500 mg Oral Daily  . cholecalciferol  5,000 Units Oral Daily  . enoxaparin (LOVENOX) injection  0.5 mg/kg Subcutaneous Daily  . ipratropium  2 puff Inhalation Q8H  . methylPREDNISolone (SOLU-MEDROL) injection  60 mg Intravenous Daily  . thyroid  60 mg Oral QAC breakfast  . zinc sulfate  220 mg Oral Daily   Continuous Infusions: . remdesivir 100 mg in NS 100 mL 100 mg (05/21/19 0933)   PRN Meds:.acetaminophen, benzonatate, cyclobenzaprine, diphenhydrAMINE, guaiFENesin-dextromethorphan, [DISCONTINUED] ondansetron **OR** ondansetron (ZOFRAN) IV  Antibiotics  :    Anti-infectives (From admission, onward)   Start     Dose/Rate Route Frequency Ordered Stop   05/20/19 1000  remdesivir 100 mg in sodium chloride 0.9 % 100 mL IVPB     100 mg 200 mL/hr over 30 Minutes Intravenous Daily 05/19/19 0128 05/24/19 0959   05/19/19 0145  remdesivir 200 mg in sodium chloride 0.9% 250 mL IVPB     200 mg 580 mL/hr over 30 Minutes Intravenous Once 05/19/19 0128 05/19/19 0356       Time Spent in minutes  30   Lala Lund M.D on 05/21/2019 at 10:10 AM  To page go to [email or web address] - password [facility]  [facility] -  Office  [phone]  See all Orders from today for further details    Objective:   Vitals:   05/20/19 1600 05/20/19 1959 05/21/19 0426 05/21/19 0727    BP: 115/69 111/88 96/70 (!) 113/59  Pulse:  72 78 74  Resp:  '20 18 16  '$ Temp: 98.8 F (37.1 C) 98.4 F (36.9 C) 98.9 F (37.2 C) 97.7 F (36.5 C)  TempSrc: Oral Oral Oral Oral  SpO2:  91% 91% 92%  Weight:      Height:        Wt Readings from Last 3 Encounters:  05/19/19 133.8 kg  02/06/17 128.8 kg  01/30/17 129 kg     Intake/Output Summary (Last 24 hours) at 05/21/2019 1010 Last data filed at 05/20/2019 2029 Gross per 24 hour  Intake 1509.65 ml  Output --  Net 1509.65 ml     Physical Exam  Awake Alert,  No new F.N deficits, Normal affect [place].AT,PERRAL Supple Neck,No JVD, No cervical lymphadenopathy appriciated.  Symmetrical Chest wall movement, Good air movement bilaterally, CTAB RRR,No Gallops, Rubs or new Murmurs, No Parasternal Heave +ve B.Sounds, Abd Soft, No tenderness,   No Cyanosis, Clubbing or edema, No new Rash or bruise    Data Review:    CBC Recent Labs  Lab 05/18/19 1743 05/20/19 0405 05/21/19 0040  WBC 5.4 3.6* 7.9  HGB 14.9 13.3 12.7  HCT 46.0 41.7 39.6  PLT 282 301 305  MCV 94.5 95.0 94.1  MCH 30.6 30.3 30.2  MCHC 32.4 31.9 32.1  RDW 13.0  12.9 12.6  LYMPHSABS 1.2 0.6* 1.0  MONOABS 0.3 0.3 0.5  EOSABS 0.1 0.0 0.0  BASOSABS 0.0 0.0 0.0    Chemistries  Recent Labs  Lab 05/18/19 1743 05/20/19 0405 05/21/19 0040  NA 137 140 137  K 3.7 3.8 3.5  CL 100 100 98  CO2 '26 28 29  '$ GLUCOSE 117* 171* 123*  BUN 11 21* 21*  CREATININE 0.65 0.49 0.56  CALCIUM 8.7* 8.4* 8.2*  MG  --  2.4 2.3  AST 49* 32 28  ALT 36 27 22  ALKPHOS 70 65 53  BILITOT 0.6 0.7 0.9   ------------------------------------------------------------------------------------------------------------------ Recent Labs    05/18/19 1743  TRIG 126    No results found for: HGBA1C ------------------------------------------------------------------------------------------------------------------ No results for input(s): TSH, T4TOTAL, T3FREE, THYROIDAB in the last 72  hours.  Invalid input(s): FREET3  Cardiac Enzymes No results for input(s): CKMB, TROPONINI, MYOGLOBIN in the last 168 hours.  Invalid input(s): CK ------------------------------------------------------------------------------------------------------------------    Component Value Date/Time   BNP 44.3 05/21/2019 0040    Micro Results Recent Results (from the past 240 hour(s))  Blood Culture (routine x 2)     Status: None (Preliminary result)   Collection Time: 05/18/19  5:43 PM   Specimen: BLOOD RIGHT ARM  Result Value Ref Range Status   Specimen Description BLOOD RIGHT ARM  Final   Special Requests   Final    BOTTLES DRAWN AEROBIC AND ANAEROBIC Blood Culture adequate volume   Culture   Final    NO GROWTH 2 DAYS Performed at [facility], [address] [place], [place] [address]    Report Status PENDING  Incomplete    Radiology Reports DG Chest 1 View  Result Date: 05/18/2019 CLINICAL DATA:  Cough and shortness of breath. EXAM: CHEST  1 VIEW COMPARISON:  None. FINDINGS: There are slightly prominent interstitial lung markings bilaterally with subtle airspace opacities for example in the right lower and left upper lung zones. The heart size is normal. There is no acute osseous abnormality. There is no pneumothorax or significant pleural effusion. IMPRESSION: Subtle hazy bilateral airspace opacities which can be seen in patients with viral pneumonia. Electronically Signed   By: Constance Holster M.D.   On: 05/18/2019 20:24

## 2019-05-21 NOTE — Plan of Care (Signed)

## 2019-05-21 NOTE — Plan of Care (Signed)
Patient remains alertx4, 1-2L HFNC continues to be her oxygen requirement, still has hacking dry cough even after prn cough medicines, ambulation to bathroom is improved, continues with third dose of Remdesivir tx today, vitals wnl, safety precautions maintained, continue plan of care Problem: Education: Goal: Knowledge of risk factors and measures for prevention of condition will improve Outcome: Progressing   Problem: Coping: Goal: Psychosocial and spiritual needs will be supported Outcome: Progressing   Problem: Respiratory: Goal: Will maintain a patent airway Outcome: Progressing Goal: Complications related to the disease process, condition or treatment will be avoided or minimized Outcome: Progressing   Problem: Education: Goal: Knowledge of General Education information will improve Description: Including pain rating scale, medication(s)/side effects and non-pharmacologic comfort measures Outcome: Progressing   Problem: Health Behavior/Discharge Planning: Goal: Ability to manage health-related needs will improve Outcome: Progressing   Problem: Clinical Measurements: Goal: Ability to maintain clinical measurements within normal limits will improve Outcome: Progressing Goal: Will remain free from infection Outcome: Progressing Goal: Diagnostic test results will improve Outcome: Progressing Goal: Respiratory complications will improve Outcome: Progressing Goal: Cardiovascular complication will be avoided Outcome: Progressing   Problem: Activity: Goal: Risk for activity intolerance will decrease Outcome: Progressing   Problem: Nutrition: Goal: Adequate nutrition will be maintained Outcome: Progressing   Problem: Coping: Goal: Level of anxiety will decrease Outcome: Progressing   Problem: Elimination: Goal: Will not experience complications related to bowel motility Outcome: Progressing Goal: Will not experience complications related to urinary retention Outcome:  Progressing   Problem: Pain Managment: Goal: General experience of comfort will improve Outcome: Progressing   Problem: Safety: Goal: Ability to remain free from injury will improve Outcome: Progressing   Problem: Skin Integrity: Goal: Risk for impaired skin integrity will decrease Outcome: Progressing

## 2019-05-22 LAB — CBC WITH DIFFERENTIAL/PLATELET
Abs Immature Granulocytes: 0.08 10*3/uL — ABNORMAL HIGH (ref 0.00–0.07)
Basophils Absolute: 0 10*3/uL (ref 0.0–0.1)
Basophils Relative: 1 %
Eosinophils Absolute: 0 10*3/uL (ref 0.0–0.5)
Eosinophils Relative: 0 %
HCT: 39 % (ref 36.0–46.0)
Hemoglobin: 12.3 g/dL (ref 12.0–15.0)
Immature Granulocytes: 1 %
Lymphocytes Relative: 19 %
Lymphs Abs: 1.2 10*3/uL (ref 0.7–4.0)
MCH: 30.4 pg (ref 26.0–34.0)
MCHC: 31.5 g/dL (ref 30.0–36.0)
MCV: 96.3 fL (ref 80.0–100.0)
Monocytes Absolute: 0.6 10*3/uL (ref 0.1–1.0)
Monocytes Relative: 10 %
Neutro Abs: 4.6 10*3/uL (ref 1.7–7.7)
Neutrophils Relative %: 69 %
Platelets: 285 10*3/uL (ref 150–400)
RBC: 4.05 MIL/uL (ref 3.87–5.11)
RDW: 12.7 % (ref 11.5–15.5)
WBC: 6.6 10*3/uL (ref 4.0–10.5)
nRBC: 0 % (ref 0.0–0.2)

## 2019-05-22 LAB — COMPREHENSIVE METABOLIC PANEL
ALT: 20 U/L (ref 0–44)
AST: 23 U/L (ref 15–41)
Albumin: 3.1 g/dL — ABNORMAL LOW (ref 3.5–5.0)
Alkaline Phosphatase: 53 U/L (ref 38–126)
Anion gap: 10 (ref 5–15)
BUN: 17 mg/dL (ref 6–20)
CO2: 29 mmol/L (ref 22–32)
Calcium: 8 mg/dL — ABNORMAL LOW (ref 8.9–10.3)
Chloride: 101 mmol/L (ref 98–111)
Creatinine, Ser: 0.6 mg/dL (ref 0.44–1.00)
GFR calc Af Amer: >60 mL/min (ref >60)
GFR calc non Af Amer: >60 mL/min (ref >60)
Glucose, Bld: 106 mg/dL — ABNORMAL HIGH (ref 70–99)
Potassium: 4 mmol/L (ref 3.5–5.1)
Sodium: 140 mmol/L (ref 135–145)
Total Bilirubin: 0.5 mg/dL (ref 0.3–1.2)
Total Protein: 6.2 g/dL — ABNORMAL LOW (ref 6.5–8.1)

## 2019-05-22 LAB — C-REACTIVE PROTEIN: CRP: 9 mg/dL — ABNORMAL HIGH (ref <1.0)

## 2019-05-22 LAB — D-DIMER, QUANTITATIVE: D-Dimer, Quant: 0.33 ug/mL-FEU (ref 0.00–0.50)

## 2019-05-22 LAB — HEMOGLOBIN A1C
Hgb A1c MFr Bld: 6.2 % — ABNORMAL HIGH (ref 4.8–5.6)
Mean Plasma Glucose: 131.24 mg/dL

## 2019-05-22 LAB — LACTATE DEHYDROGENASE: LDH: 284 U/L — ABNORMAL HIGH (ref 98–192)

## 2019-05-22 LAB — BRAIN NATRIURETIC PEPTIDE: B Natriuretic Peptide: 359.6 pg/mL — ABNORMAL HIGH (ref 0.0–100.0)

## 2019-05-22 LAB — MAGNESIUM: Magnesium: 2.5 mg/dL — ABNORMAL HIGH (ref 1.7–2.4)

## 2019-05-22 MED ORDER — METHYLPREDNISOLONE SODIUM SUCC 40 MG IJ SOLR
40.0000 mg | Freq: Every day | INTRAMUSCULAR | Status: DC
  Administered 2019-05-22 – 2019-05-23 (×2): 40 mg via INTRAVENOUS
  Filled 2019-05-22 (×2): qty 1

## 2019-05-22 MED ORDER — ASPIRIN 81 MG PO CHEW
81.0000 mg | CHEWABLE_TABLET | Freq: Every day | ORAL | Status: DC
  Administered 2019-05-22 – 2019-05-25 (×4): 81 mg via ORAL
  Filled 2019-05-22 (×4): qty 1

## 2019-05-22 NOTE — Progress Notes (Addendum)
Physical Therapy Treatment Patient Details Name: Gina Conner MRN: [id number] DOB: Sep 25, 1965 Today's Date: 05/22/2019    History of Present Illness 54 year old female admitted 05/18/19 with acute hypoxemic respiratory failure due to COVID 19, PMH includes obesity and hypothryroidism.    PT Comments    Pt weak, off balance and desating to the 80s on 4 L O2 [place] during gait.  We used the rollator today as RN, Ovid Curd, reported she got lightheaded with him earlier while walking, so took it as a support and a emergency chair if needed, but she did not sit, she did go slow and lean heavily on the rollator.  She reported R 1-3rd finger numbness and 1-2 toe numbness on the right.  Strength intact and Dr. Clayborne Artist.  She is anxious and a bit jittery today, worried about her son and husband at home.  PT will continue to follow acutely for safe mobility progression.   Follow Up Recommendations  Home health PT     Equipment Recommendations  Other (comment)(possible home O2)    Recommendations for Other Services   NA     Precautions / Restrictions Precautions Precautions: Fall;Other (comment) Precaution Comments: monitor O2 sats    Mobility  Bed Mobility Overal bed mobility: Modified Independent                Transfers Overall transfer level: Needs assistance Equipment used: 4-wheeled walker Transfers: Sit to/from Stand Sit to Stand: Min guard         General transfer comment: Min guard assist for safety.   Ambulation/Gait Ambulation/Gait assistance: Min guard Gait Distance (Feet): 85 Feet Assistive device: 4-wheeled walker Gait Pattern/deviations: Step-through pattern;Staggering left;Staggering right Gait velocity: decreased Gait velocity interpretation: 1.31 - 2.62 ft/sec, indicative of limited community ambulator General Gait Details: Pt with slow gait speed, staggering pattern. Needs encouragement not to walk and talk as she desaturates into the 80s on 4L O2 [place]  with DOE 2/4.  In the upper 80s low 90s at rest via nellcor finger probe.           Balance Overall balance assessment: Needs assistance Sitting-balance support: Feet supported;No upper extremity supported Sitting balance-Leahy Scale: Good     Standing balance support: Bilateral upper extremity supported;No upper extremity supported;Single extremity supported Standing balance-Leahy Scale: Fair                              Cognition Arousal/Alertness: Awake/alert Behavior During Therapy: Anxious(very jittery) Overall Cognitive Status: No family/caregiver present to determine baseline cognitive functioning                                 General Comments: There is something just a shade off about her, not sure what it is and not sure if it is a change from baseline.       Exercises Other Exercises Other Exercises: IS and FV x 5 each (they both incite coughing spells, so I only had her do 5 each and encouraged her to do 5 more each before the end of the hour.  IS volume 600 mL    General Comments General comments (skin integrity, edema, etc.): Pt reports her 1-3rd fingers on her R hand are tingling as well as her 1-2 toes on her right foot.  It is constant and started last night.  Strength appears to remain normal both grip  and LE.  I messaged MD (Dr. Candiss Norse) to make him aware.        Pertinent Vitals/Pain Pain Assessment: No/denies pain           PT Goals (current goals can now be found in the care plan section) Acute Rehab PT Goals Patient Stated Goal: get home, breathe better, keep it together for her son and husband Progress towards PT goals: Progressing toward goals    Frequency    Min 3X/week      PT Plan Current plan remains appropriate       AM-PAC PT "6 Clicks" Mobility   Outcome Measure  Help needed turning from your back to your side while in a flat bed without using bedrails?: A Little Help needed moving from lying on your  back to sitting on the side of a flat bed without using bedrails?: A Little Help needed moving to and from a bed to a chair (including a wheelchair)?: A Little Help needed standing up from a chair using your arms (e.g., wheelchair or bedside chair)?: A Little Help needed to walk in hospital room?: A Little Help needed climbing 3-5 steps with a railing? : A Little 6 Click Score: 18    End of Session Equipment Utilized During Treatment: Oxygen Activity Tolerance: Patient limited by fatigue Patient left: in bed;Other (comment)(seated EOB) Nurse Communication: Mobility status;Other (comment)(reports she did not tell MD about R UE and R LE numbness) PT Visit Diagnosis: Muscle weakness (generalized) (M62.81);Unsteadiness on feet (R26.81)     Time:  [id number]    Charges:  $Gait Training: 23-37 mins $Therapeutic Activity: 8-22 mins           Verdene Lennert, PT, DPT  Acute Rehabilitation [phone] pager #[phone] office  @ [facility]: [phone]           05/22/2019, 5:21 PM

## 2019-05-22 NOTE — Plan of Care (Signed)
Patient status remains unchanged, using 4-6L [place], scheduled for 4th Remdesivir Tx today, continue plan of care Problem: Education: Goal: Knowledge of risk factors and measures for prevention of condition will improve Outcome: Progressing   Problem: Coping: Goal: Psychosocial and spiritual needs will be supported Outcome: Progressing   Problem: Respiratory: Goal: Will maintain a patent airway Outcome: Progressing Goal: Complications related to the disease process, condition or treatment will be avoided or minimized Outcome: Progressing   Problem: Education: Goal: Knowledge of General Education information will improve Description: Including pain rating scale, medication(s)/side effects and non-pharmacologic comfort measures Outcome: Progressing   Problem: Health Behavior/Discharge Planning: Goal: Ability to manage health-related needs will improve Outcome: Progressing   Problem: Clinical Measurements: Goal: Ability to maintain clinical measurements within normal limits will improve Outcome: Progressing Goal: Will remain free from infection Outcome: Progressing Goal: Diagnostic test results will improve Outcome: Progressing Goal: Respiratory complications will improve Outcome: Progressing Goal: Cardiovascular complication will be avoided Outcome: Progressing   Problem: Activity: Goal: Risk for activity intolerance will decrease Outcome: Progressing   Problem: Nutrition: Goal: Adequate nutrition will be maintained Outcome: Progressing   Problem: Coping: Goal: Level of anxiety will decrease Outcome: Progressing   Problem: Elimination: Goal: Will not experience complications related to bowel motility Outcome: Progressing Goal: Will not experience complications related to urinary retention Outcome: Progressing   Problem: Pain Managment: Goal: General experience of comfort will improve Outcome: Progressing   Problem: Safety: Goal: Ability to remain free from injury  will improve Outcome: Progressing   Problem: Skin Integrity: Goal: Risk for impaired skin integrity will decrease Outcome: Progressing

## 2019-05-22 NOTE — Progress Notes (Signed)
PROGRESS NOTE                                                                                                                                                                                                             Patient Demographics:    Gina Conner, is a 54 y.o. female, DOB - Jun 09, 1965, VOU:[id number]  Outpatient Primary MD for the patient is Sasser, Silvestre Moment, MD    LOS - 3  Admit date - 05/18/2019    Chief Complaint  Patient presents with  . Cough  . COVID positive       Brief Narrative - 54 year old female with past medical history of hypothyroidism who presented with cough and shortness of breath was diagnosed with acute hypoxic respiratory failure due to COVID-19 pneumonia and admitted to [facility].   Subjective:   Patient sitting in chair in no apparent distress chest pain, much improved shortness of breath.   Assessment  & Plan :     1. Acute Hypoxic Resp. Failure due to Acute Covid 19 Viral Pneumonitis during the ongoing 2020 Covid 19 Pandemic -she has mild to moderate disease, currently on IV steroids and remdesivir and improving.  She is clinically improved.  Steroids encouraged with activity.  Encouraged the patient to sit up in chair in the daytime use I-S and flutter valve for pulmonary toiletry and then prone in bed when at night.  SpO2: 90 % O2 Flow Rate (L/min): 4 L/min  Recent Labs  Lab 05/18/19 1743 05/20/19 0405 05/21/19 0040 05/22/19 0159  CRP 14.6* 13.1* 5.9* 9.0*  DDIMER 0.39 0.39 0.33 0.33  FERRITIN 52  --   --   --   BNP 17.6 15.9 44.3 359.6*  PROCALCITON <0.10  --   --   --     Hepatic Function Latest Ref Rng & Units 05/22/2019 05/21/2019 05/20/2019  Total Protein 6.5 - 8.1 g/dL 6.2(L) 6.3(L) 6.8  Albumin 3.5 - 5.0 g/dL 3.1(L) 3.2(L) 3.5  AST 15 - 41 U/L 23 28 32  ALT 0 - 44 U/L '20 22 27  '$ Alk Phosphatase 38 - 126 U/L 53 53 65  Total Bilirubin 0.3 - 1.2 mg/dL 0.5 0.9 0.7    2.   Mild anxiety.  Low-dose benzo.  3.  Hypothyroidism.  Home dose thyroid supplementation continued.     Condition - Fair  Family Communication  :  None  Code Status :  Full  Diet :   Diet Order            Diet Heart Room service appropriate? Yes; Fluid consistency: Thin  Diet effective now               Disposition Plan  :  Home  Consults  :  None  Procedures  :  None  PUD Prophylaxis : None  DVT Prophylaxis  :  Lovenox   Lab Results  Component Value Date   PLT 285 05/22/2019    Inpatient Medications  Scheduled Meds: . albuterol  2 puff Inhalation Q8H  . vitamin C  500 mg Oral Daily  . cholecalciferol  5,000 Units Oral Daily  . enoxaparin (LOVENOX) injection  0.5 mg/kg Subcutaneous Daily  . ipratropium  2 puff Inhalation Q8H  . methylPREDNISolone (SOLU-MEDROL) injection  60 mg Intravenous Daily  . thyroid  60 mg Oral QAC breakfast  . zinc sulfate  220 mg Oral Daily   Continuous Infusions: . remdesivir 100 mg in NS 100 mL 100 mg (05/21/19 0933)   PRN Meds:.acetaminophen, benzonatate, cyclobenzaprine, diphenhydrAMINE, guaiFENesin-dextromethorphan, [DISCONTINUED] ondansetron **OR** ondansetron (ZOFRAN) IV  Antibiotics  :    Anti-infectives (From admission, onward)   Start     Dose/Rate Route Frequency Ordered Stop   05/20/19 1000  remdesivir 100 mg in sodium chloride 0.9 % 100 mL IVPB     100 mg 200 mL/hr over 30 Minutes Intravenous Daily 05/19/19 0128 05/24/19 0959   05/19/19 0145  remdesivir 200 mg in sodium chloride 0.9% 250 mL IVPB     200 mg 580 mL/hr over 30 Minutes Intravenous Once 05/19/19 0128 05/19/19 0356       Time Spent in minutes  30   Lala Lund M.D on 05/22/2019 at 8:50 AM  To page go to [email or web address] - password [facility]  [facility] -  Office  [phone]  See all Orders from today for further details    Objective:   Vitals:   05/21/19 1218 05/21/19 1628 05/22/19 0348 05/22/19 0752  BP: 121/72 123/69 119/66 118/67    Pulse: 80 78 66 77  Resp: 18 16 (!) 22 18  Temp: 97.9 F (36.6 C) 98 F (36.7 C) 98.1 F (36.7 C) 97.8 F (36.6 C)  TempSrc: Oral Oral Oral Oral  SpO2: 91% 90% 98% 90%  Weight:      Height:        Wt Readings from Last 3 Encounters:  05/19/19 133.8 kg  02/06/17 128.8 kg  01/30/17 129 kg    No intake or output data in the 24 hours ending 05/22/19 0850   Physical Exam  Awake Alert,  No new F.N deficits, Normal affect [place].AT,PERRAL Supple Neck,No JVD, No cervical lymphadenopathy appriciated.  Symmetrical Chest wall movement, Good air movement bilaterally, CTAB RRR,No Gallops, Rubs or new Murmurs, No Parasternal Heave +ve B.Sounds, Abd Soft, No tenderness, No organomegaly appriciated, No rebound - guarding or rigidity. No Cyanosis, Clubbing or edema, No new Rash or bruise    Data Review:    CBC Recent Labs  Lab 05/18/19 1743 05/20/19 0405 05/21/19 0040 05/22/19 0159  WBC 5.4 3.6* 7.9 6.6  HGB 14.9 13.3 12.7 12.3  HCT 46.0 41.7 39.6 39.0  PLT 282 301 305 285  MCV 94.5 95.0 94.1 96.3  MCH 30.6 30.3 30.2 30.4  MCHC 32.4 31.9 32.1 31.5  RDW 13.0 12.9 12.6 12.7  LYMPHSABS 1.2 0.6* 1.0  1.2  MONOABS 0.3 0.3 0.5 0.6  EOSABS 0.1 0.0 0.0 0.0  BASOSABS 0.0 0.0 0.0 0.0    Chemistries  Recent Labs  Lab 05/18/19 1743 05/20/19 0405 05/21/19 0040 05/22/19 0159  NA 137 140 137 140  K 3.7 3.8 3.5 4.0  CL 100 100 98 101  CO2 '26 28 29 29  '$ GLUCOSE 117* 171* 123* 106*  BUN 11 21* 21* 17  CREATININE 0.65 0.49 0.56 0.60  CALCIUM 8.7* 8.4* 8.2* 8.0*  MG  --  2.4 2.3 2.5*  AST 49* 32 28 23  ALT 36 '27 22 20  '$ ALKPHOS 70 65 53 53  BILITOT 0.6 0.7 0.9 0.5   ------------------------------------------------------------------------------------------------------------------ No results for input(s): CHOL, HDL, LDLCALC, TRIG, CHOLHDL, LDLDIRECT in the last 72 hours.  No results found for:  HGBA1C ------------------------------------------------------------------------------------------------------------------ No results for input(s): TSH, T4TOTAL, T3FREE, THYROIDAB in the last 72 hours.  Invalid input(s): FREET3  Cardiac Enzymes No results for input(s): CKMB, TROPONINI, MYOGLOBIN in the last 168 hours.  Invalid input(s): CK ------------------------------------------------------------------------------------------------------------------    Component Value Date/Time   BNP 359.6 (H) 05/22/2019 0159    Micro Results Recent Results (from the past 240 hour(s))  Blood Culture (routine x 2)     Status: None (Preliminary result)   Collection Time: 05/18/19  5:43 PM   Specimen: BLOOD RIGHT ARM  Result Value Ref Range Status   Specimen Description BLOOD RIGHT ARM  Final   Special Requests   Final    BOTTLES DRAWN AEROBIC AND ANAEROBIC Blood Culture adequate volume   Culture   Final    NO GROWTH 4 DAYS Performed at [facility], [address], [place], [place] [address]    Report Status PENDING  Incomplete    Radiology Reports DG Chest 1 View  Result Date: 05/18/2019 CLINICAL DATA:  Cough and shortness of breath. EXAM: CHEST  1 VIEW COMPARISON:  None. FINDINGS: There are slightly prominent interstitial lung markings bilaterally with subtle airspace opacities for example in the right lower and left upper lung zones. The heart size is normal. There is no acute osseous abnormality. There is no pneumothorax or significant pleural effusion. IMPRESSION: Subtle hazy bilateral airspace opacities which can be seen in patients with viral pneumonia. Electronically Signed   By: Constance Holster M.D.   On: 05/18/2019 20:24

## 2019-05-22 NOTE — Plan of Care (Signed)

## 2019-05-23 ENCOUNTER — Inpatient Hospital Stay (HOSPITAL_COMMUNITY): Payer: Commercial Managed Care - PPO

## 2019-05-23 LAB — CBC WITH DIFFERENTIAL/PLATELET
Abs Immature Granulocytes: 0.12 10*3/uL — ABNORMAL HIGH (ref 0.00–0.07)
Basophils Absolute: 0 10*3/uL (ref 0.0–0.1)
Basophils Relative: 0 %
Eosinophils Absolute: 0 10*3/uL (ref 0.0–0.5)
Eosinophils Relative: 0 %
HCT: 41.1 % (ref 36.0–46.0)
Hemoglobin: 12.9 g/dL (ref 12.0–15.0)
Immature Granulocytes: 1 %
Lymphocytes Relative: 16 %
Lymphs Abs: 1.4 10*3/uL (ref 0.7–4.0)
MCH: 29.9 pg (ref 26.0–34.0)
MCHC: 31.4 g/dL (ref 30.0–36.0)
MCV: 95.1 fL (ref 80.0–100.0)
Monocytes Absolute: 0.7 10*3/uL (ref 0.1–1.0)
Monocytes Relative: 8 %
Neutro Abs: 6.8 10*3/uL (ref 1.7–7.7)
Neutrophils Relative %: 75 %
Platelets: 317 10*3/uL (ref 150–400)
RBC: 4.32 MIL/uL (ref 3.87–5.11)
RDW: 12.4 % (ref 11.5–15.5)
WBC: 9.1 10*3/uL (ref 4.0–10.5)
nRBC: 0 % (ref 0.0–0.2)

## 2019-05-23 LAB — COMPREHENSIVE METABOLIC PANEL
ALT: 16 U/L (ref 0–44)
AST: 19 U/L (ref 15–41)
Albumin: 3.1 g/dL — ABNORMAL LOW (ref 3.5–5.0)
Alkaline Phosphatase: 49 U/L (ref 38–126)
Anion gap: 9 (ref 5–15)
BUN: 15 mg/dL (ref 6–20)
CO2: 30 mmol/L (ref 22–32)
Calcium: 8.1 mg/dL — ABNORMAL LOW (ref 8.9–10.3)
Chloride: 100 mmol/L (ref 98–111)
Creatinine, Ser: 0.59 mg/dL (ref 0.44–1.00)
GFR calc Af Amer: >60 mL/min (ref >60)
GFR calc non Af Amer: >60 mL/min (ref >60)
Glucose, Bld: 83 mg/dL (ref 70–99)
Potassium: 4.3 mmol/L (ref 3.5–5.1)
Sodium: 139 mmol/L (ref 135–145)
Total Bilirubin: 0.6 mg/dL (ref 0.3–1.2)
Total Protein: 6.3 g/dL — ABNORMAL LOW (ref 6.5–8.1)

## 2019-05-23 LAB — MAGNESIUM: Magnesium: 2.7 mg/dL — ABNORMAL HIGH (ref 1.7–2.4)

## 2019-05-23 LAB — CULTURE, BLOOD (ROUTINE X 2)
Culture: NO GROWTH
Special Requests: ADEQUATE

## 2019-05-23 LAB — LIPID PANEL
Cholesterol: 121 mg/dL (ref 0–200)
HDL: 31 mg/dL — ABNORMAL LOW (ref >40)
LDL Cholesterol: 68 mg/dL (ref 0–99)
Total CHOL/HDL Ratio: 3.9 RATIO
Triglycerides: 108 mg/dL (ref <150)
VLDL: 22 mg/dL (ref 0–40)

## 2019-05-23 LAB — BRAIN NATRIURETIC PEPTIDE: B Natriuretic Peptide: 46.7 pg/mL (ref 0.0–100.0)

## 2019-05-23 LAB — C-REACTIVE PROTEIN: CRP: 8.8 mg/dL — ABNORMAL HIGH (ref <1.0)

## 2019-05-23 LAB — PROCALCITONIN: Procalcitonin: <0.1 ng/mL

## 2019-05-23 LAB — D-DIMER, QUANTITATIVE: D-Dimer, Quant: 0.43 ug/mL-FEU (ref 0.00–0.50)

## 2019-05-23 MED ORDER — ALPRAZOLAM 0.5 MG PO TABS
0.2500 mg | ORAL_TABLET | Freq: Once | ORAL | Status: AC
  Administered 2019-05-23: 20:00:00 0.25 mg via ORAL
  Filled 2019-05-23: qty 1

## 2019-05-23 MED ORDER — FUROSEMIDE 10 MG/ML IJ SOLN
40.0000 mg | Freq: Once | INTRAMUSCULAR | Status: AC
  Administered 2019-05-23: 40 mg via INTRAVENOUS
  Filled 2019-05-23: qty 4

## 2019-05-23 NOTE — Plan of Care (Signed)

## 2019-05-23 NOTE — Progress Notes (Signed)
PROGRESS NOTE                                                                                                                                                                                                             Patient Demographics:    Gina Conner, is a 54 y.o. female, DOB - 1966/01/07, QPR:[id number]  Outpatient Primary MD for the patient is Sasser, Silvestre Moment, MD    LOS - 4  Admit date - 05/18/2019    Chief Complaint  Patient presents with  . Cough  . COVID positive       Brief Narrative - 54 year old female with past medical history of hypothyroidism who presented with cough and shortness of breath was diagnosed with acute hypoxic respiratory failure due to COVID-19 pneumonia and admitted to [facility].   Subjective:   Patient in bed in prone position denies any headache chest or abdominal pain, breathing is improved, has some tingling and numbness in the right third and fourth digit ongoing for about couple of days.  No loss in strength.   Assessment  & Plan :     1. Acute Hypoxic Resp. Failure due to Acute Covid 19 Viral Pneumonitis during the ongoing 2020 Covid 19 Pandemic -she has mild to moderate disease, currently on IV steroids and remdesivir and improving.  She is clinically improved.  Steroids encouraged with activity. Repeat CXR and try 1 dose Lasix as has few rales.  Encouraged the patient to sit up in chair in the daytime use I-S and flutter valve for pulmonary toiletry and then prone in bed when at night.  SpO2: (!) 88 % O2 Flow Rate (L/min): 6 L/min  Recent Labs  Lab 05/18/19 1743 05/20/19 0405 05/21/19 0040 05/22/19 0159 05/23/19 0043  CRP 14.6* 13.1* 5.9* 9.0* 8.8*  DDIMER 0.39 0.39 0.33 0.33 0.43  FERRITIN 52  --   --   --   --   BNP 17.6 15.9 44.3 359.6* 46.7  PROCALCITON <0.10  --   --   --   --     Hepatic Function Latest Ref Rng & Units 05/23/2019 05/22/2019 05/21/2019  Total Protein 6.5 -  8.1 g/dL 6.3(L) 6.2(L) 6.3(L)  Albumin 3.5 - 5.0 g/dL 3.1(L) 3.1(L) 3.2(L)  AST 15 - 41 U/L '19 23 28  '$ ALT 0 - 44 U/L 16 20 22  Alk Phosphatase 38 - 126 U/L 49 53 53  Total Bilirubin 0.3 - 1.2 mg/dL 0.6 0.5 0.9    2.  Mild anxiety.  Low-dose benzo.  3.  Hypothyroidism.  Home dose thyroid supplementation continued.  4.  Right hand third and fourth digit ulnar nerve distribution tingling numbness.  Stable grip strength, likely entrapment at the elbow, could also be at the shoulder from laying prone.  Supportive care for now.  Outpatient follow-up if needed.    Condition - Fair  Family Communication  :  None  Code Status :  Full  Diet :   Diet Order            Diet Heart Room service appropriate? Yes; Fluid consistency: Thin  Diet effective now               Disposition Plan  :  Home  Consults  :  None  Procedures  :  None  PUD Prophylaxis : None  DVT Prophylaxis  :  Lovenox   Lab Results  Component Value Date   PLT 317 05/23/2019    Inpatient Medications  Scheduled Meds: . albuterol  2 puff Inhalation Q8H  . vitamin C  500 mg Oral Daily  . aspirin  81 mg Oral Daily  . cholecalciferol  5,000 Units Oral Daily  . enoxaparin (LOVENOX) injection  0.5 mg/kg Subcutaneous Daily  . ipratropium  2 puff Inhalation Q8H  . methylPREDNISolone (SOLU-MEDROL) injection  40 mg Intravenous Daily  . thyroid  60 mg Oral QAC breakfast  . zinc sulfate  220 mg Oral Daily   Continuous Infusions: . remdesivir 100 mg in NS 100 mL Stopped (05/22/19 0959)   PRN Meds:.acetaminophen, benzonatate, cyclobenzaprine, diphenhydrAMINE, guaiFENesin-dextromethorphan, [DISCONTINUED] ondansetron **OR** ondansetron (ZOFRAN) IV  Antibiotics  :    Anti-infectives (From admission, onward)   Start     Dose/Rate Route Frequency Ordered Stop   05/20/19 1000  remdesivir 100 mg in sodium chloride 0.9 % 100 mL IVPB     100 mg 200 mL/hr over 30 Minutes Intravenous Daily 05/19/19 0128 05/24/19 0959     05/19/19 0145  remdesivir 200 mg in sodium chloride 0.9% 250 mL IVPB     200 mg 580 mL/hr over 30 Minutes Intravenous Once 05/19/19 0128 05/19/19 0356       Time Spent in minutes  30   Lala Lund M.D on 05/23/2019 at 9:27 AM  To page go to [email or web address] - password [facility]  [facility] -  Office  [phone]  See all Orders from today for further details    Objective:   Vitals:   05/22/19 1621 05/22/19 2000 05/23/19 0500 05/23/19 0743  BP: 117/77 128/63 129/70 118/63  Pulse: 77   74  Resp: 20 (!) '23 16 18  '$ Temp: 98.2 F (36.8 C) 98.4 F (36.9 C) 98.1 F (36.7 C) 98.2 F (36.8 C)  TempSrc: Oral Oral Oral Oral  SpO2: 90% 90%  (!) 88%  Weight:      Height:        Wt Readings from Last 3 Encounters:  05/19/19 133.8 kg  02/06/17 128.8 kg  01/30/17 129 kg    No intake or output data in the 24 hours ending 05/23/19 0927   Physical Exam  Awake Alert,  No motor F.N deficits, Normal affect [place].AT,PERRAL Supple Neck,No JVD, No cervical lymphadenopathy appriciated.  Symmetrical Chest wall movement, Good air movement bilaterally, few rales RRR,No Gallops, Rubs or new Murmurs, No Parasternal Heave +ve B.Sounds,  Abd Soft, No tenderness, No organomegaly appriciated, No rebound - guarding or rigidity. No Cyanosis, Clubbing or edema, No new Rash or bruise     Data Review:    CBC Recent Labs  Lab 05/18/19 1743 05/20/19 0405 05/21/19 0040 05/22/19 0159 05/23/19 0043  WBC 5.4 3.6* 7.9 6.6 9.1  HGB 14.9 13.3 12.7 12.3 12.9  HCT 46.0 41.7 39.6 39.0 41.1  PLT 282 301 305 285 317  MCV 94.5 95.0 94.1 96.3 95.1  MCH 30.6 30.3 30.2 30.4 29.9  MCHC 32.4 31.9 32.1 31.5 31.4  RDW 13.0 12.9 12.6 12.7 12.4  LYMPHSABS 1.2 0.6* 1.0 1.2 1.4  MONOABS 0.3 0.3 0.5 0.6 0.7  EOSABS 0.1 0.0 0.0 0.0 0.0  BASOSABS 0.0 0.0 0.0 0.0 0.0    Chemistries  Recent Labs  Lab 05/18/19 1743 05/20/19 0405 05/21/19 0040 05/22/19 0159 05/23/19 0043  NA 137 140 137 140 139  K  3.7 3.8 3.5 4.0 4.3  CL 100 100 98 101 100  CO2 '26 28 29 29 30  '$ GLUCOSE 117* 171* 123* 106* 83  BUN 11 21* 21* 17 15  CREATININE 0.65 0.49 0.56 0.60 0.59  CALCIUM 8.7* 8.4* 8.2* 8.0* 8.1*  MG  --  2.4 2.3 2.5* 2.7*  AST 49* 32 '28 23 19  '$ ALT 36 '27 22 20 16  '$ ALKPHOS 70 65 53 53 49  BILITOT 0.6 0.7 0.9 0.5 0.6   ------------------------------------------------------------------------------------------------------------------ Recent Labs    05/23/19 0043  CHOL 121  HDL 31*  LDLCALC 68  TRIG 108  CHOLHDL 3.9    Lab Results  Component Value Date   HGBA1C 6.2 (H) 05/22/2019   ------------------------------------------------------------------------------------------------------------------ No results for input(s): TSH, T4TOTAL, T3FREE, THYROIDAB in the last 72 hours.  Invalid input(s): FREET3  Cardiac Enzymes No results for input(s): CKMB, TROPONINI, MYOGLOBIN in the last 168 hours.  Invalid input(s): CK ------------------------------------------------------------------------------------------------------------------    Component Value Date/Time   BNP 46.7 05/23/2019 0043    Micro Results Recent Results (from the past 240 hour(s))  Blood Culture (routine x 2)     Status: None   Collection Time: 05/18/19  5:43 PM   Specimen: BLOOD RIGHT ARM  Result Value Ref Range Status   Specimen Description BLOOD RIGHT ARM  Final   Special Requests   Final    BOTTLES DRAWN AEROBIC AND ANAEROBIC Blood Culture adequate volume   Culture   Final    NO GROWTH 5 DAYS Performed at [facility], [address] [place], [place] [address]    Report Status 05/23/2019 FINAL  Final    Radiology Reports DG Chest 1 View  Result Date: 05/18/2019 CLINICAL DATA:  Cough and shortness of breath. EXAM: CHEST  1 VIEW COMPARISON:  None. FINDINGS: There are slightly prominent interstitial lung markings bilaterally with subtle airspace opacities for example in the right lower and left upper lung  zones. The heart size is normal. There is no acute osseous abnormality. There is no pneumothorax or significant pleural effusion. IMPRESSION: Subtle hazy bilateral airspace opacities which can be seen in patients with viral pneumonia. Electronically Signed   By: Constance Holster M.D.   On: 05/18/2019 20:24

## 2019-05-24 ENCOUNTER — Inpatient Hospital Stay (HOSPITAL_COMMUNITY): Payer: Commercial Managed Care - PPO

## 2019-05-24 LAB — BASIC METABOLIC PANEL
Anion gap: 10 (ref 5–15)
BUN: 17 mg/dL (ref 6–20)
CO2: 31 mmol/L (ref 22–32)
Calcium: 8.3 mg/dL — ABNORMAL LOW (ref 8.9–10.3)
Chloride: 98 mmol/L (ref 98–111)
Creatinine, Ser: 0.66 mg/dL (ref 0.44–1.00)
GFR calc Af Amer: >60 mL/min (ref >60)
GFR calc non Af Amer: >60 mL/min (ref >60)
Glucose, Bld: 95 mg/dL (ref 70–99)
Potassium: 3.6 mmol/L (ref 3.5–5.1)
Sodium: 139 mmol/L (ref 135–145)

## 2019-05-24 LAB — PROCALCITONIN: Procalcitonin: <0.1 ng/mL

## 2019-05-24 LAB — EXPECTORATED SPUTUM ASSESSMENT W GRAM STAIN, RFLX TO RESP C

## 2019-05-24 LAB — CBC
HCT: 42.1 % (ref 36.0–46.0)
Hemoglobin: 13.5 g/dL (ref 12.0–15.0)
MCH: 30.3 pg (ref 26.0–34.0)
MCHC: 32.1 g/dL (ref 30.0–36.0)
MCV: 94.6 fL (ref 80.0–100.0)
Platelets: 348 10*3/uL (ref 150–400)
RBC: 4.45 MIL/uL (ref 3.87–5.11)
RDW: 12.4 % (ref 11.5–15.5)
WBC: 11.9 10*3/uL — ABNORMAL HIGH (ref 4.0–10.5)
nRBC: 0 % (ref 0.0–0.2)

## 2019-05-24 MED ORDER — ALPRAZOLAM 0.5 MG PO TABS
0.2500 mg | ORAL_TABLET | Freq: Once | ORAL | Status: AC
  Administered 2019-05-24: 0.25 mg via ORAL
  Filled 2019-05-24: qty 1

## 2019-05-24 MED ORDER — METHYLPREDNISOLONE SODIUM SUCC 40 MG IJ SOLR
20.0000 mg | Freq: Every day | INTRAMUSCULAR | Status: DC
  Administered 2019-05-24 – 2019-05-25 (×2): 20 mg via INTRAVENOUS
  Filled 2019-05-24 (×2): qty 1

## 2019-05-24 NOTE — Progress Notes (Signed)
2nd walk test:  O2 sat on room air while resting: 84% O2 sat on 4L [place] while ambulating: 91% O2 sat on 2L  while ambulating: 88%  Patient tolerated ambulation much better this time. No SOB

## 2019-05-24 NOTE — Plan of Care (Signed)

## 2019-05-24 NOTE — Progress Notes (Signed)
Walk test results:  O2 sat on room air: 80% O2 sat while ambulating with 6L [place]: 82%  I'm going to let the patient rest and repeat the walk test in a little bit. Will try rotating oxygen probe sites to test accuracy. The monitor in the room seems to show that the patient's O2 sat stays around 90 on 4L [place] at rest.

## 2019-05-24 NOTE — Progress Notes (Signed)
PROGRESS NOTE                                                                                                                                                                                                             Patient Demographics:    Gina Conner, is a 54 y.o. female, DOB - 05-15-1965, [id number]  Outpatient Primary MD for the patient is Sasser, Silvestre Moment, MD    LOS - 5  Admit date - 05/18/2019    Chief Complaint  Patient presents with  . Cough  . COVID positive       Brief Narrative - 54 year old female with past medical history of hypothyroidism who presented with cough and shortness of breath was diagnosed with acute hypoxic respiratory failure due to COVID-19 pneumonia and admitted to [facility].   Subjective:   Patient inchair, appears comfortable, denies any headache, no fever, no chest pain or pressure, improved shortness of breath , no abdominal pain. No focal weakness.    Assessment  & Plan :     1. Acute Hypoxic Resp. Failure due to Acute Covid 19 Viral Pneumonitis during the ongoing 2020 Covid 19 Pandemic -she has mild to moderate disease, currently on IV steroids and remdesivir and improving.  Already improving continue to taper steroids, advance activity and titrate down oxygen as tolerated, repeat chest x-ray appears stable, will check inflammatory markers again in the morning.  Encouraged the patient to sit up in chair in the daytime use I-S and flutter valve for pulmonary toiletry and then prone in bed when at night.  SpO2: (!) 89 % O2 Flow Rate (L/min): 5 L/min  Recent Labs  Lab 05/18/19 1743 05/20/19 0405 05/21/19 0040 05/22/19 0159 05/23/19 0043 05/23/19 1506 05/24/19 0503  CRP 14.6* 13.1* 5.9* 9.0* 8.8*  --   --   DDIMER 0.39 0.39 0.33 0.33 0.43  --   --   FERRITIN 52  --   --   --   --   --   --   BNP 17.6 15.9 44.3 359.6* 46.7  --   --   PROCALCITON <0.10  --   --   --   --  <0.10  <0.10    Hepatic Function Latest Ref Rng & Units 05/23/2019 05/22/2019 05/21/2019  Total Protein 6.5 - 8.1 g/dL 6.3(L) 6.2(L) 6.3(L)  Albumin 3.5 -  5.0 g/dL 3.1(L) 3.1(L) 3.2(L)  AST 15 - 41 U/L '19 23 28  '$ ALT 0 - 44 U/L '16 20 22  '$ Alk Phosphatase 38 - 126 U/L 49 53 53  Total Bilirubin 0.3 - 1.2 mg/dL 0.6 0.5 0.9    2.  Mild anxiety.  Low-dose benzo.  3.  Hypothyroidism.  Home dose thyroid supplementation continued.  4.  Right hand third and fourth digit ulnar nerve distribution tingling numbness.  Stable grip strength, likely entrapment at the elbow, could also be at the shoulder from laying prone.  Supportive care for now.  Outpatient follow-up if needed.    Condition - Fair  Family Communication  :  None  Code Status :  Full  Diet :   Diet Order            Diet Heart Room service appropriate? Yes; Fluid consistency: Thin  Diet effective now               Disposition Plan  :  Home  Consults  :  None  Procedures  :  None  PUD Prophylaxis : None  DVT Prophylaxis  :  Lovenox   Lab Results  Component Value Date   PLT 348 05/24/2019    Inpatient Medications  Scheduled Meds: . albuterol  2 puff Inhalation Q8H  . vitamin C  500 mg Oral Daily  . aspirin  81 mg Oral Daily  . cholecalciferol  5,000 Units Oral Daily  . enoxaparin (LOVENOX) injection  0.5 mg/kg Subcutaneous Daily  . ipratropium  2 puff Inhalation Q8H  . methylPREDNISolone (SOLU-MEDROL) injection  20 mg Intravenous Daily  . thyroid  60 mg Oral QAC breakfast  . zinc sulfate  220 mg Oral Daily   Continuous Infusions:  PRN Meds:.acetaminophen, benzonatate, cyclobenzaprine, diphenhydrAMINE, guaiFENesin-dextromethorphan, [DISCONTINUED] ondansetron **OR** ondansetron (ZOFRAN) IV  Antibiotics  :    Anti-infectives (From admission, onward)   Start     Dose/Rate Route Frequency Ordered Stop   05/20/19 1000  remdesivir 100 mg in sodium chloride 0.9 % 100 mL IVPB     100 mg 200 mL/hr over 30 Minutes  Intravenous Daily 05/19/19 0128 05/23/19 1032   05/19/19 0145  remdesivir 200 mg in sodium chloride 0.9% 250 mL IVPB     200 mg 580 mL/hr over 30 Minutes Intravenous Once 05/19/19 0128 05/19/19 0356       Time Spent in minutes  30   Lala Lund M.D on 05/24/2019 at 9:04 AM  To page go to [email or web address] - password [facility]  [facility] -  Office  [phone]  See all Orders from today for further details    Objective:   Vitals:   05/23/19 1500 05/23/19 1951 05/24/19 0515 05/24/19 0805  BP: 125/75 115/79 112/62 116/75  Pulse: 84 78 72 80  Resp: 18 (!) 22 (!) 27 16  Temp: 97.8 F (36.6 C) 97.8 F (36.6 C) 98.3 F (36.8 C) 97.9 F (36.6 C)  TempSrc: Oral Oral Oral Oral  SpO2: 90% 90% 90% (!) 89%  Weight:      Height:        Wt Readings from Last 3 Encounters:  05/19/19 133.8 kg  02/06/17 128.8 kg  01/30/17 129 kg    No intake or output data in the 24 hours ending 05/24/19 0904   Physical Exam  Awake Alert,  No motor F.N deficits, Anxious affect [place].AT,PERRAL Supple Neck,No JVD, No cervical lymphadenopathy appriciated.  Symmetrical Chest wall movement, Good air movement bilaterally, CTAB  RRR,No Gallops, Rubs or new Murmurs, No Parasternal Heave +ve B.Sounds, Abd Soft, No tenderness, No organomegaly appriciated, No rebound - guarding or rigidity. No Cyanosis, Clubbing or edema, No new Rash or bruise    Data Review:    CBC Recent Labs  Lab 05/18/19 1743 05/20/19 0405 05/21/19 0040 05/22/19 0159 05/23/19 0043 05/24/19 0503  WBC 5.4 3.6* 7.9 6.6 9.1 11.9*  HGB 14.9 13.3 12.7 12.3 12.9 13.5  HCT 46.0 41.7 39.6 39.0 41.1 42.1  PLT 282 301 305 285 317 348  MCV 94.5 95.0 94.1 96.3 95.1 94.6  MCH 30.6 30.3 30.2 30.4 29.9 30.3  MCHC 32.4 31.9 32.1 31.5 31.4 32.1  RDW 13.0 12.9 12.6 12.7 12.4 12.4  LYMPHSABS 1.2 0.6* 1.0 1.2 1.4  --   MONOABS 0.3 0.3 0.5 0.6 0.7  --   EOSABS 0.1 0.0 0.0 0.0 0.0  --   BASOSABS 0.0 0.0 0.0 0.0 0.0  --      Chemistries  Recent Labs  Lab 05/18/19 1743 05/20/19 0405 05/21/19 0040 05/22/19 0159 05/23/19 0043 05/24/19 0503  NA 137 140 137 140 139 139  K 3.7 3.8 3.5 4.0 4.3 3.6  CL 100 100 98 101 100 98  CO2 '26 28 29 29 30 31  '$ GLUCOSE 117* 171* 123* 106* 83 95  BUN 11 21* 21* '17 15 17  '$ CREATININE 0.65 0.49 0.56 0.60 0.59 0.66  CALCIUM 8.7* 8.4* 8.2* 8.0* 8.1* 8.3*  MG  --  2.4 2.3 2.5* 2.7*  --   AST 49* 32 '28 23 19  '$ --   ALT 36 '27 22 20 16  '$ --   ALKPHOS 70 65 53 53 49  --   BILITOT 0.6 0.7 0.9 0.5 0.6  --    ------------------------------------------------------------------------------------------------------------------ Recent Labs    05/23/19 0043  CHOL 121  HDL 31*  LDLCALC 68  TRIG 108  CHOLHDL 3.9    Lab Results  Component Value Date   HGBA1C 6.2 (H) 05/22/2019   ------------------------------------------------------------------------------------------------------------------ No results for input(s): TSH, T4TOTAL, T3FREE, THYROIDAB in the last 72 hours.  Invalid input(s): FREET3  Cardiac Enzymes No results for input(s): CKMB, TROPONINI, MYOGLOBIN in the last 168 hours.  Invalid input(s): CK ------------------------------------------------------------------------------------------------------------------    Component Value Date/Time   BNP 46.7 05/23/2019 0043    Micro Results Recent Results (from the past 240 hour(s))  Blood Culture (routine x 2)     Status: None   Collection Time: 05/18/19  5:43 PM   Specimen: BLOOD RIGHT ARM  Result Value Ref Range Status   Specimen Description BLOOD RIGHT ARM  Final   Special Requests   Final    BOTTLES DRAWN AEROBIC AND ANAEROBIC Blood Culture adequate volume   Culture   Final    NO GROWTH 5 DAYS Performed at [facility], [address] [place], [place] [address]    Report Status 05/23/2019 FINAL  Final    Radiology Reports DG Chest 1 View  Result Date: 05/18/2019 CLINICAL DATA:  Cough and  shortness of breath. EXAM: CHEST  1 VIEW COMPARISON:  None. FINDINGS: There are slightly prominent interstitial lung markings bilaterally with subtle airspace opacities for example in the right lower and left upper lung zones. The heart size is normal. There is no acute osseous abnormality. There is no pneumothorax or significant pleural effusion. IMPRESSION: Subtle hazy bilateral airspace opacities which can be seen in patients with viral pneumonia. Electronically Signed   By: Constance Holster M.D.   On:  05/18/2019 20:24   CXR am  Result Date: 05/24/2019 CLINICAL DATA:  Shortness of breath. EXAM: PORTABLE CHEST 1 VIEW COMPARISON:  05/23/2019 FINDINGS: 0544 hours. Low lung volumes. Cardiopericardial silhouette is at upper limits of normal for size. Persistent patchy bilateral airspace opacities without evidence of pleural effusion. No pneumothorax. The visualized bony structures of the thorax are intact. Telemetry leads overlie the chest. IMPRESSION: Persistent low volume film with patchy bilateral airspace disease. Electronically Signed   By: Misty Stanley M.D.   On: 05/24/2019 08:48   DG Chest Port 1 View  Result Date: 05/23/2019 CLINICAL DATA:  Shortness of breath EXAM: PORTABLE CHEST 1 VIEW COMPARISON:  May 18, 2019 FINDINGS: The study is markedly limited due to very low lung volumes. Additionally, the patient's chin overlies the upper chest. No pneumothorax identified. Bilateral pulmonary opacities are identified, more focal in the right base in the interval. No other interval changes. IMPRESSION: 1. Markedly limited study due to low volumes and the patient's chin overlying the upper chest. 2. Focal opacity in the right base suggestive of developing infiltrate which could represent pneumonia or aspiration. 3. Mild diffuse hazy opacities may be due to the low volumes or part of the suspected infectious process. Electronically Signed   By: Dorise Bullion III M.D   On: 05/23/2019 09:40

## 2019-05-24 NOTE — Progress Notes (Signed)
Occupational Therapy Evaluation Patient Details Name: Gina Conner MRN: [id number] DOB: 1965/06/19 Today's Date: 05/24/2019    History of Present Illness 54 year old female admitted 05/18/19 with acute hypoxemic respiratory failure due to COVID 19, PMH includes obesity and hypothryroidism.   Clinical Impression   PTA pt lived with her husband, independent in all self-care, IADL, and mobility tasks. Pt did not ambulate with an assistive device and reports 0 falls in the last 6 months. 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 Pt does not use oxygen at home and is currently on 5L [place]. Pt currently independent to supervision for self-care and functional transfer tasks. Pt able to ambulate to/from bathroom without an assistive device and stand ~5 min at the sink to complete grooming/hygiene tasks. SpO2 decreased to 91% on 6L following task with pt reporting moderate shortness of breath and anxiety throughout. Educated and provided pt with handouts regarding relaxation strategies, energy conservation, and pursed lip breathing. Pt demonstrates decreased strength, endurance, balance, standing tolerance, and activity tolerance impacting ability to complete self-care and functional transfer tasks. Recommend skilled OT services to address above deficits in order to promote function and prevent further decline.     Follow Up Recommendations  No OT follow up    Equipment Recommendations  3 in 1 bedside commode(for use in shower)    Recommendations for Other Services       Precautions / Restrictions Precautions Precautions: Fall Restrictions Weight Bearing Restrictions: No      Mobility Bed Mobility               General bed mobility comments: Pt seated in bedside chair upon OT arrival.  Transfers Overall transfer level: Needs assistance Equipment used: None Transfers: Sit to/from Stand;Stand Pivot Transfers Sit to Stand: Supervision Stand pivot transfers:  Supervision       General transfer comment: Pt occasionally uses furniture for support while ambulating    Balance Overall balance assessment: Mild deficits observed, not formally tested                                         ADL either performed or assessed with clinical judgement   ADL Overall ADL's : Needs assistance/impaired Eating/Feeding: Independent;Sitting   Grooming: Supervision/safety;Standing;Set up   Upper Body Bathing: Supervision/ safety;Set up;Standing   Lower Body Bathing: Supervison/ safety;Min guard;Sit to/from stand   Upper Body Dressing : Set up;Sitting   Lower Body Dressing: Set up;Supervision/safety;Sit to/from stand   Toilet Transfer: Supervision/safety;Set up;Ambulation;Grab bars;Regular Museum/gallery exhibitions officer and Hygiene: Supervision/safety;Set up;Sit to/from stand       Functional mobility during ADLs: Supervision/safety General ADL Comments: Pt able to ambulate to/from bathroom without an assistive device, noted 0 instances of LOB. Pt stood at the sink ~5 min to complete grooming/hygiene tasks.      Vision Baseline Vision/History: Wears glasses Wears Glasses: Reading only       Perception     Praxis      Pertinent Vitals/Pain Pain Assessment: No/denies pain     Hand Dominance Right   Extremity/Trunk Assessment Upper Extremity Assessment Upper Extremity Assessment: Generalized weakness   Lower Extremity Assessment Lower Extremity Assessment: Defer to PT evaluation       Communication Communication Communication: No difficulties   Cognition Arousal/Alertness: Awake/alert Behavior During Therapy: Anxious Overall Cognitive Status: No family/caregiver present to determine baseline cognitive functioning  General Comments  Pt on 5L [place] at rest with SpO2 92%. Pt ambulated to/from bathroom and stood ~5 min at the sink with SpO2 dropping to 91% on  6L [place]. Pt reports moderate SOB anxiety throughout. Educated and provided pt with handouts regarding energy conservation, relaxation techniques, and pursed lip breathing.    Exercises     Shoulder Instructions      Home Living Family/patient expects to be discharged to:: Private residence Living Arrangements: Spouse/significant other Available Help at Discharge: Family;Available 24 hours/day Type of Home: House Home Access: Stairs to enter [employer] of Steps: 3 Entrance Stairs-Rails: Right Home Layout: One level     Bathroom Shower/Tub: Teacher, early years/pre: Standard     Home Equipment: None          Prior Functioning/Environment Level of Independence: Independent        Comments: Pt independent in ADLs, IADLs, and mobility. Pt does not ambulate with an assistive device. Pt does not use oxygen at home.        OT Problem List: Decreased strength;Decreased activity tolerance;Impaired balance (sitting and/or standing);Cardiopulmonary status limiting activity      OT Treatment/Interventions: Self-care/ADL training;Therapeutic exercise;Neuromuscular education;Energy conservation;DME and/or AE instruction;Therapeutic activities;Patient/family education;Balance training    OT Goals(Current goals can be found in the care plan section) Acute Rehab OT Goals Patient Stated Goal: To breathe better and go home Time For Goal Achievement: 06/07/19 Potential to Achieve Goals: Good ADL Goals Pt Will Perform Grooming: Independently;standing Pt Will Perform Lower Body Bathing: Independently;sit to/from stand Pt Will Perform Lower Body Dressing: Independently;sit to/from stand Pt Will Transfer to Toilet: Independently;ambulating;regular height toilet Pt Will Perform Toileting - Clothing Manipulation and hygiene: Independently;sit to/from stand Additional ADL Goal #1: Pt to recall and verbalize 3 relaxation strategies with 0 verbal cues. Additional ADL Goal  #2: Pt to recall and verbalize 3 energy conservation techniques with 0 verbal cues. Additional ADL Goal #3: Pt to tolerate standing up to 10 min independently with SpO2 maintaining above 90%, in preparation for ADLs.  OT Frequency: Min 3X/week   Barriers to D/C:            Co-evaluation              AM-PAC OT "6 Clicks" Daily Activity     Outcome Measure Help from another person eating meals?: None Help from another person taking care of personal grooming?: A Little Help from another person toileting, which includes using toliet, bedpan, or urinal?: A Little Help from another person bathing (including washing, rinsing, drying)?: A Little Help from another person to put on and taking off regular upper body clothing?: A Little Help from another person to put on and taking off regular lower body clothing?: A Little 6 Click Score: 19   End of Session Equipment Utilized During Treatment: Oxygen Nurse Communication: Mobility status  Activity Tolerance: Patient limited by fatigue(Limited by SOB and anxiety) Patient left: in chair;with call bell/phone within reach  OT Visit Diagnosis: Unsteadiness on feet (R26.81);Muscle weakness (generalized) (M62.81)                Time: [id number] OT Time Calculation (min): 38 min Charges:  OT General Charges $OT Visit: 1 Visit OT Evaluation $OT Eval Low Complexity: 1 Low OT Treatments $Self Care/Home Management : 8-22 mins $Therapeutic Activity: 8-22 mins  Mauri Brooklyn OTR/L [phone]   Mauri Brooklyn 05/24/2019, 11:49 AM

## 2019-05-25 LAB — CBC WITH DIFFERENTIAL/PLATELET
Abs Immature Granulocytes: 0.13 K/uL — ABNORMAL HIGH (ref 0.00–0.07)
Basophils Absolute: 0 K/uL (ref 0.0–0.1)
Basophils Relative: 0 %
Eosinophils Absolute: 0.5 K/uL (ref 0.0–0.5)
Eosinophils Relative: 4 %
HCT: 43.1 % (ref 36.0–46.0)
Hemoglobin: 13.8 g/dL (ref 12.0–15.0)
Immature Granulocytes: 1 %
Lymphocytes Relative: 12 %
Lymphs Abs: 1.5 K/uL (ref 0.7–4.0)
MCH: 30.5 pg (ref 26.0–34.0)
MCHC: 32 g/dL (ref 30.0–36.0)
MCV: 95.1 fL (ref 80.0–100.0)
Monocytes Absolute: 0.7 K/uL (ref 0.1–1.0)
Monocytes Relative: 6 %
Neutro Abs: 9.2 K/uL — ABNORMAL HIGH (ref 1.7–7.7)
Neutrophils Relative %: 77 %
Platelets: 367 K/uL (ref 150–400)
RBC: 4.53 MIL/uL (ref 3.87–5.11)
RDW: 12.6 % (ref 11.5–15.5)
WBC: 12 K/uL — ABNORMAL HIGH (ref 4.0–10.5)
nRBC: 0 % (ref 0.0–0.2)

## 2019-05-25 LAB — COMPREHENSIVE METABOLIC PANEL
ALT: 14 U/L (ref 0–44)
AST: 15 U/L (ref 15–41)
Albumin: 3.1 g/dL — ABNORMAL LOW (ref 3.5–5.0)
Alkaline Phosphatase: 42 U/L (ref 38–126)
Anion gap: 13 (ref 5–15)
BUN: 20 mg/dL (ref 6–20)
CO2: 28 mmol/L (ref 22–32)
Calcium: 8.3 mg/dL — ABNORMAL LOW (ref 8.9–10.3)
Chloride: 99 mmol/L (ref 98–111)
Creatinine, Ser: 0.66 mg/dL (ref 0.44–1.00)
GFR calc Af Amer: >60 mL/min (ref >60)
GFR calc non Af Amer: >60 mL/min (ref >60)
Glucose, Bld: 87 mg/dL (ref 70–99)
Potassium: 3.6 mmol/L (ref 3.5–5.1)
Sodium: 140 mmol/L (ref 135–145)
Total Bilirubin: 0.8 mg/dL (ref 0.3–1.2)
Total Protein: 6.3 g/dL — ABNORMAL LOW (ref 6.5–8.1)

## 2019-05-25 LAB — C-REACTIVE PROTEIN: CRP: 8.2 mg/dL — ABNORMAL HIGH (ref <1.0)

## 2019-05-25 LAB — MAGNESIUM: Magnesium: 2.4 mg/dL (ref 1.7–2.4)

## 2019-05-25 LAB — BRAIN NATRIURETIC PEPTIDE: B Natriuretic Peptide: 12 pg/mL (ref 0.0–100.0)

## 2019-05-25 LAB — PROCALCITONIN: Procalcitonin: <0.1 ng/mL

## 2019-05-25 MED ORDER — ALBUTEROL SULFATE HFA 108 (90 BASE) MCG/ACT IN AERS: 2.0000 | INHALATION_SPRAY | Freq: Four times a day (QID) | RESPIRATORY_TRACT | 0 refills | Status: DC | PRN

## 2019-05-25 MED ORDER — PREDNISONE 5 MG PO TABS: ORAL_TABLET | ORAL | 0 refills | Status: DC

## 2019-05-25 NOTE — Discharge Summary (Signed)
Gina Conner QQP:[id number] DOB: 11-Mar-1966 DOA: 05/18/2019  PCP: Manon Hilding, MD  Admit date: 05/18/2019  Discharge date: 05/25/2019  Admitted From: Home   Disposition:  Home   Recommendations for Outpatient Follow-up:   Follow up with PCP in 1-2 weeks  PCP Please obtain BMP/CBC, 2 view CXR in 1week,  (see Discharge instructions)   PCP Please follow up on the following pending results:    Home Health: RN   Equipment/Devices: 2lit o2 PRN  Consultations: None  Discharge Condition: Stable    CODE STATUS: Full    Diet Recommendation: Heart Healthy   Diet Order            Diet - low sodium heart healthy        Diet Heart Room service appropriate? Yes; Fluid consistency: Thin  Diet effective now               Chief Complaint  Patient presents with  . Cough  . COVID positive     Brief history of present illness from the day of admission and additional interim summary    54 year old female with past medical history of hypothyroidism who presented with cough and shortness of breath was diagnosed with acute hypoxic respiratory failure due to COVID-19 pneumonia and admitted to [facility].                                                                 Hospital Course    1. Acute Hypoxic Resp. Failure due to Acute Covid 19 Viral Pneumonitis during the ongoing 2020 Covid 19 Pandemic -she had mild to moderate disease, she was treated with steroids and remdesivir with good improvement, she is currently on oral steroid taper and symptom-free, at rest she is requiring anywhere between room air to 1 L nasal cannula oxygen upon ambulation at times she requires 2 L, she will get oral steroid taper and 2 L nasal cannula oxygen as needed for home use and will be discharged home with outpatient PCP follow-up.  Also will  order an RN to keep an eye on her.   Recent Labs  Lab 05/18/19 1743 05/20/19 0405 05/21/19 0040 05/22/19 0159 05/23/19 0043 05/23/19 1506 05/24/19 0503 05/25/19 0425  CRP 14.6* 13.1* 5.9* 9.0* 8.8*  --   --  8.2*  DDIMER 0.39 0.39 0.33 0.33 0.43  --   --   --   FERRITIN 52  --   --   --   --   --   --   --   BNP 17.6 15.9 44.3 359.6* 46.7  --   --  12.0  PROCALCITON <0.10  --   --   --   --  <0.10 <0.10 <0.10    Hepatic Function Latest Ref Rng & Units 05/25/2019 05/23/2019 05/22/2019  Total Protein 6.5 - 8.1 g/dL 6.3(L) 6.3(L) 6.2(L)  Albumin 3.5 - 5.0 g/dL 3.1(L) 3.1(L) 3.1(L)  AST 15 - 41 U/L _0 ALT 0 - 44 U/L _1 Alk Phosphatase 38 - 126 U/L 42 49 53  Total Bilirubin 0.3 - 1.2 mg/dL 0.8 0.6 0.5     2.  Mild anxiety.  Low-dose benzo.  3.  Hypothyroidism.  Home dose thyroid supplementation continued.  4.  Right hand third and fourth digit ulnar nerve distribution tingling numbness.  Stable grip strength, likely entrapment at the elbow, could also be at the shoulder from laying prone.  Supportive care for now.  Outpatient follow-up if needed to be arranged by PCP if symptoms persist.    Discharge diagnosis     Principal Problem:   Acute hypoxemic respiratory failure due to COVID-19 [facility]) Active Problems:   Hypothyroidism   Acute respiratory failure due to COVID-19 [facility])    Discharge instructions    Discharge Instructions    Diet - low sodium heart healthy   Complete by: As directed    Discharge instructions   Complete by: As directed    Follow with Primary MD Quintin Alto, Silvestre Moment, MD in 7 days   Get CBC, CMP, 2 view Chest X ray -  checked next visit within 1 week by Primary MD   Activity: As tolerated with Full fall precautions use walker/cane & assistance as needed  Disposition Home   Diet: Heart Healthy    Special Instructions: If you have smoked or chewed Tobacco  in the last 2 yrs please stop smoking, stop any regular Alcohol  and or any  Recreational drug use.  On your next visit with your primary care physician please Get Medicines reviewed and adjusted.  Please request your Prim.MD to go over all Hospital Tests and Procedure/Radiological results at the follow up, please get all Hospital records sent to your Prim MD by signing hospital release before you go home.  If you experience worsening of your admission symptoms, develop shortness of breath, life threatening emergency, suicidal or homicidal thoughts you must seek medical attention immediately by calling 911 or calling your MD immediately  if symptoms less severe.  You Must read complete instructions/literature along with all the possible adverse reactions/side effects for all the Medicines you take and that have been prescribed to you. Take any new Medicines after you have completely understood and accpet all the possible adverse reactions/side effects.   Increase activity slowly   Complete by: As directed       Discharge Medications   Allergies as of 05/25/2019   No Known Allergies     Medication List    STOP taking these medications   ondansetron 4 MG tablet Commonly known as: ZOFRAN   oxyCODONE 5 MG immediate release tablet Commonly known as: Oxy IR/ROXICODONE   predniSONE 20 MG tablet Commonly known as: DELTASONE Replaced by: predniSONE 5 MG tablet     TAKE these medications   acetaminophen 500 MG tablet Commonly known as: TYLENOL Take 1,000 mg by mouth every 6 (six) hours as needed for mild pain or headache.   albuterol 108 (90 Base) MCG/ACT inhaler Commonly known as: VENTOLIN HFA Inhale 2 puffs into the lungs every 6 (six) hours as needed for wheezing or shortness of breath.   ALPRAZolam 0.5 MG tablet Commonly known as: XANAX Take 0.5 mg by mouth at bedtime as needed for sleep.   CYCLOBENZAPRINE HCL PO Take 1 tablet by  mouth daily as needed (muscle aches/pain).   D 5000 125 MCG (5000 UT) capsule Generic drug: Cholecalciferol Take 5,000  Units by mouth daily.   guaiFENesin-dextromethorphan 100-10 MG/5ML syrup Commonly known as: ROBITUSSIN DM Take 5 mLs by mouth every 4 (four) hours as needed for cough.   Iron (Ferrous Gluconate) 256 (28 Fe) MG Tabs Take 1 tablet by mouth daily.   methocarbamol 500 MG tablet Commonly known as: ROBAXIN Take 1 tablet (500 mg total) by mouth every 6 (six) hours as needed for muscle spasms.   NP Thyroid 60 MG tablet Generic drug: thyroid Take 60 mg by mouth daily before breakfast.   predniSONE 5 MG tablet Commonly known as: DELTASONE Label  & dispense according to the schedule below. take 8 Pills PO for 3 days, 6 Pills PO for 3 days, 4 Pills PO for 3 days, 2 Pills PO for 3 days, 1 Pills PO for 3 days, 1/2 Pill  PO for 3 days then STOP. Total 65 pills. Replaces: predniSONE 20 MG tablet   spironolactone 25 MG tablet Commonly known as: ALDACTONE Take 25 mg by mouth at bedtime.            Durable Medical Equipment  (From admission, onward)         Start     Ordered   05/25/19 1040  For home use only DME oxygen  Once    Question Answer Comment  Length of Need 6 Months   Mode or (Route) Nasal cannula   Liters per Minute 2   Frequency Continuous (stationary and portable oxygen unit needed)   Oxygen conserving device Yes   Oxygen delivery system Gas      05/25/19 1039          Follow-up Information    Sasser, Silvestre Moment, MD. Schedule an appointment as soon as possible for a visit in 1 week(s).   Specialty: Family Medicine Contact information: [place] [address] [phone]           Major procedures and Radiology Reports - PLEASE review detailed and final reports thoroughly  -         DG Chest 1 View  Result Date: 05/18/2019 CLINICAL DATA:  Cough and shortness of breath. EXAM: CHEST  1 VIEW COMPARISON:  None. FINDINGS: There are slightly prominent interstitial lung markings bilaterally with subtle airspace opacities for example in the right lower and  left upper lung zones. The heart size is normal. There is no acute osseous abnormality. There is no pneumothorax or significant pleural effusion. IMPRESSION: Subtle hazy bilateral airspace opacities which can be seen in patients with viral pneumonia. Electronically Signed   By: Constance Holster M.D.   On: 05/18/2019 20:24   CXR am  Result Date: 05/24/2019 CLINICAL DATA:  Shortness of breath. EXAM: PORTABLE CHEST 1 VIEW COMPARISON:  05/23/2019 FINDINGS: 0544 hours. Low lung volumes. Cardiopericardial silhouette is at upper limits of normal for size. Persistent patchy bilateral airspace opacities without evidence of pleural effusion. No pneumothorax. The visualized bony structures of the thorax are intact. Telemetry leads overlie the chest. IMPRESSION: Persistent low volume film with patchy bilateral airspace disease. Electronically Signed   By: Misty Stanley M.D.   On: 05/24/2019 08:48   DG Chest Port 1 View  Result Date: 05/23/2019 CLINICAL DATA:  Shortness of breath EXAM: PORTABLE CHEST 1 VIEW COMPARISON:  May 18, 2019 FINDINGS: The study is markedly limited due to very low lung volumes. Additionally, the patient's chin  overlies the upper chest. No pneumothorax identified. Bilateral pulmonary opacities are identified, more focal in the right base in the interval. No other interval changes. IMPRESSION: 1. Markedly limited study due to low volumes and the patient's chin overlying the upper chest. 2. Focal opacity in the right base suggestive of developing infiltrate which could represent pneumonia or aspiration. 3. Mild diffuse hazy opacities may be due to the low volumes or part of the suspected infectious process. Electronically Signed   By: Dorise Bullion III M.D   On: 05/23/2019 09:40    Micro Results     Recent Results (from the past 240 hour(s))  Blood Culture (routine x 2)     Status: None   Collection Time: 05/18/19  5:43 PM   Specimen: BLOOD RIGHT ARM  Result Value Ref Range Status    Specimen Description BLOOD RIGHT ARM  Final   Special Requests   Final    BOTTLES DRAWN AEROBIC AND ANAEROBIC Blood Culture adequate volume   Culture   Final    NO GROWTH 5 DAYS Performed at [facility], [address] [place], [place] [address]    Report Status 05/23/2019 FINAL  Final  Expectorated sputum assessment w rflx to resp cult     Status: None   Collection Time: 05/24/19  8:10 AM   Specimen: Sputum  Result Value Ref Range Status   Specimen Description SPUTUM  Final   Special Requests NONE  Final   Sputum evaluation   Final    THIS SPECIMEN IS ACCEPTABLE FOR SPUTUM CULTURE Performed at [facility], [place] [address], [place], [place] [address]    Report Status 05/24/2019 FINAL  Final  Culture, respiratory     Status: None (Preliminary result)   Collection Time: 05/24/19  8:10 AM   Specimen: SPU  Result Value Ref Range Status   Specimen Description   Final    SPUTUM Performed at [address] [place], [place] [address]    Special Requests   Final    NONE Reflexed from [id number] Performed at [facility], [place] [address], [place], [place] [address]    Gram Stain   Final    RARE WBC PRESENT, PREDOMINANTLY PMN RARE GRAM POSITIVE COCCI IN PAIRS    Culture   Final    CULTURE REINCUBATED FOR BETTER GROWTH Performed at [facility], [place] [address], [place], [place] [address]    Report Status PENDING  Incomplete    Today   Subjective    Gina Conner today has no headache,no chest abdominal pain,no new weakness tingling or numbness, feels much better wants to go home today.    Objective   Blood pressure 102/60, pulse 77, temperature 97.6 F (36.4 C), temperature source Oral, resp. rate 18, height 5' 7" (1.702 m), weight 133.8 kg, SpO2 90%  No intake or output data in the 24 hours ending 05/25/19 1051  Exam Awake Alert, Oriented x 3, No new F.N deficits, Normal  affect Clinch.AT,PERRAL Supple Neck,No JVD, No cervical lymphadenopathy appriciated.  Symmetrical Chest wall movement, Good air movement bilaterally, CTAB RRR,No Gallops,Rubs or new Murmurs, No Parasternal Heave +ve B.Sounds, Abd Soft, Non tender, No organomegaly appriciated, No rebound -guarding or rigidity. No Cyanosis, Clubbing or edema, No new Rash or bruise   Data Review   CBC w Diff:  Lab Results  Component Value Date   WBC 12.0 (H) 05/25/2019   HGB 13.8 05/25/2019  HCT 43.1 05/25/2019   PLT 367 05/25/2019   LYMPHOPCT 12 05/25/2019   MONOPCT 6 05/25/2019   EOSPCT 4 05/25/2019   BASOPCT 0 05/25/2019    CMP:  Lab Results  Component Value Date   NA 140 05/25/2019   K 3.6 05/25/2019   CL 99 05/25/2019   CO2 28 05/25/2019   BUN 20 05/25/2019   CREATININE 0.66 05/25/2019   PROT 6.3 (L) 05/25/2019   ALBUMIN 3.1 (L) 05/25/2019   BILITOT 0.8 05/25/2019   ALKPHOS 42 05/25/2019   AST 15 05/25/2019   ALT 14 05/25/2019  .   Total Time in preparing paper work, data evaluation and todays exam - 17 minutes  Lala Lund M.D on 05/25/2019 at 10:51 AM  [facility]   Office  [phone]

## 2019-05-25 NOTE — Progress Notes (Signed)
Ambulation Note  Saturation Pre: 86-90% on RA  Ambulation Distance: 200 ft  Saturation During Ambulation: 86-91% on 4L  Notes: Pt walked independently, slow but steady. Tolerated well. Started on 2L and desaturated to 81%. Increased oxygen to 4L and saturations stayed at 86-91%. Pt returned to recliner with call bell and phone within reach. Pt on RA with sats at 90-91%.  Philis Kendall, MS, ACSM CEP 9:54 AM 05/25/2019

## 2019-05-25 NOTE — Plan of Care (Signed)

## 2019-05-25 NOTE — Discharge Instructions (Signed)
Follow with Primary MD Quintin Alto, Silvestre Moment, MD in 7 days   Get CBC, CMP, 2 view Chest X ray -  checked next visit within 1 week by Primary MD   Activity: As tolerated with Full fall precautions use walker/cane & assistance as needed  Disposition Home   Diet: Heart Healthy    Special Instructions: If you have smoked or chewed Tobacco  in the last 2 yrs please stop smoking, stop any regular Alcohol  and or any Recreational drug use.  On your next visit with your primary care physician please Get Medicines reviewed and adjusted.  Please request your Prim.MD to go over all Hospital Tests and Procedure/Radiological results at the follow up, please get all Hospital records sent to your Prim MD by signing hospital release before you go home.  If you experience worsening of your admission symptoms, develop shortness of breath, life threatening emergency, suicidal or homicidal thoughts you must seek medical attention immediately by calling 911 or calling your MD immediately  if symptoms less severe.  You Must read complete instructions/literature along with all the possible adverse reactions/side effects for all the Medicines you take and that have been prescribed to you. Take any new Medicines after you have completely understood and accpet all the possible adverse reactions/side effects.       Person Under Monitoring Name: Gina Conner  Location: [place] [address]   Infection Prevention Recommendations for Individuals Confirmed to have, or Being Evaluated for, 2019 Novel Coronavirus (COVID-19) Infection Who Receive Care at Home  Individuals who are confirmed to have, or are being evaluated for, COVID-19 should follow the prevention steps below until a healthcare provider or local or state health department says they can return to normal activities.  Stay home except to get medical care You should restrict activities outside your home, except for getting medical care. Do  not go to work, school, or public areas, and do not use public transportation or taxis.  Call ahead before visiting your doctor Before your medical appointment, call the healthcare provider and tell them that you have, or are being evaluated for, COVID-19 infection. This will help the healthcare provider's office take steps to keep other people from getting infected. Ask your healthcare provider to call the local or state health department.  Monitor your symptoms Seek prompt medical attention if your illness is worsening (e.g., difficulty breathing). Before going to your medical appointment, call the healthcare provider and tell them that you have, or are being evaluated for, COVID-19 infection. Ask your healthcare provider to call the local or state health department.  Wear a facemask You should wear a facemask that covers your nose and mouth when you are in the same room with other people and when you visit a healthcare provider. People who live with or visit you should also wear a facemask while they are in the same room with you.  Separate yourself from other people in your home As much as possible, you should stay in a different room from other people in your home. Also, you should use a separate bathroom, if available.  Avoid sharing household items You should not share dishes, drinking glasses, cups, eating utensils, towels, bedding, or other items with other people in your home. After using these items, you should wash them thoroughly with soap and water.  Cover your coughs and sneezes Cover your mouth and nose with a tissue when you cough or sneeze, or you can cough or sneeze  into your sleeve. Throw used tissues in a lined trash can, and immediately wash your hands with soap and water for at least 20 seconds or use an alcohol-based hand rub.  Wash your [facility] your hands often and thoroughly with soap and water for at least 20 seconds. You can use an alcohol-based  hand sanitizer if soap and water are not available and if your hands are not visibly dirty. Avoid touching your eyes, nose, and mouth with unwashed hands.   Prevention Steps for Caregivers and Household Members of Individuals Confirmed to have, or Being Evaluated for, COVID-19 Infection Being Cared for in the Home  If you live with, or provide care at home for, a person confirmed to have, or being evaluated for, COVID-19 infection please follow these guidelines to prevent infection:  Follow healthcare provider's instructions Make sure that you understand and can help the patient follow any healthcare provider instructions for all care.  Provide for the patient's basic needs You should help the patient with basic needs in the home and provide support for getting groceries, prescriptions, and other personal needs.  Monitor the patient's symptoms If they are getting sicker, call his or her medical provider and tell them that the patient has, or is being evaluated for, COVID-19 infection. This will help the healthcare provider's office take steps to keep other people from getting infected. Ask the healthcare provider to call the local or state health department.  Limit the number of people who have contact with the patient  If possible, have only one caregiver for the patient.  Other household members should stay in another home or place of residence. If this is not possible, they should stay  in another room, or be separated from the patient as much as possible. Use a separate bathroom, if available.  Restrict visitors who do not have an essential need to be in the home.  Keep older adults, very young children, and other sick people away from the patient Keep older adults, very young children, and those who have compromised immune systems or chronic health conditions away from the patient. This includes people with chronic heart, lung, or kidney conditions, diabetes, and  cancer.  Ensure good ventilation Make sure that shared spaces in the home have good air flow, such as from an air conditioner or an opened window, weather permitting.  Wash your hands often  Wash your hands often and thoroughly with soap and water for at least 20 seconds. You can use an alcohol based hand sanitizer if soap and water are not available and if your hands are not visibly dirty.  Avoid touching your eyes, nose, and mouth with unwashed hands.  Use disposable paper towels to dry your hands. If not available, use dedicated cloth towels and replace them when they become wet.  Wear a facemask and gloves  Wear a disposable facemask at all times in the room and gloves when you touch or have contact with the patient's blood, body fluids, and/or secretions or excretions, such as sweat, saliva, sputum, nasal mucus, vomit, urine, or feces.  Ensure the mask fits over your nose and mouth tightly, and do not touch it during use.  Throw out disposable facemasks and gloves after using them. Do not reuse.  Wash your hands immediately after removing your facemask and gloves.  If your personal clothing becomes contaminated, carefully remove clothing and launder. Wash your hands after handling contaminated clothing.  Place all used disposable facemasks, gloves, and other waste in  a lined container before disposing them with other household waste.  Remove gloves and wash your hands immediately after handling these items.  Do not share dishes, glasses, or other household items with the patient  Avoid sharing household items. You should not share dishes, drinking glasses, cups, eating utensils, towels, bedding, or other items with a patient who is confirmed to have, or being evaluated for, COVID-19 infection.  After the person uses these items, you should wash them thoroughly with soap and water.  Wash laundry thoroughly  Immediately remove and wash clothes or bedding that have blood, body  fluids, and/or secretions or excretions, such as sweat, saliva, sputum, nasal mucus, vomit, urine, or feces, on them.  Wear gloves when handling laundry from the patient.  Read and follow directions on labels of laundry or clothing items and detergent. In general, wash and dry with the warmest temperatures recommended on the label.  Clean all areas the individual has used often  Clean all touchable surfaces, such as counters, tabletops, doorknobs, bathroom fixtures, toilets, phones, keyboards, tablets, and bedside tables, every day. Also, clean any surfaces that may have blood, body fluids, and/or secretions or excretions on them.  Wear gloves when cleaning surfaces the patient has come in contact with.  Use a diluted bleach solution (e.g., dilute bleach with 1 part bleach and 10 parts water) or a household disinfectant with a label that says EPA-registered for coronaviruses. To make a bleach solution at home, add 1 tablespoon of bleach to 1 quart (4 cups) of water. For a larger supply, add  cup of bleach to 1 gallon (16 cups) of water.  Read labels of cleaning products and follow recommendations provided on product labels. Labels contain instructions for safe and effective use of the cleaning product including precautions you should take when applying the product, such as wearing gloves or eye protection and making sure you have good ventilation during use of the product.  Remove gloves and wash hands immediately after cleaning.  Monitor yourself for signs and symptoms of illness Caregivers and household members are considered close contacts, should monitor their health, and will be asked to limit movement outside of the home to the extent possible. Follow the monitoring steps for close contacts listed on the symptom monitoring form.   ? If you have additional questions, contact your local health department or call the epidemiologist on call at [phone] (available 24/7). ? This  guidance is subject to change. For the most up-to-date guidance from [facility], please refer to their website: YouBlogs.pl

## 2019-05-25 NOTE — TOC Progression Note (Signed)
Transition of Care [facility]) - Progression Note    Patient Details  Name: Gina Conner MRN: [id number] Date of Birth: 1966-03-11  [facility]) CM/SW Contact  Joaquin Courts, RN Phone Number: 05/25/2019, 12:05 PM  Clinical Narrative:    CM spoke with patient and spouse. Patient set up with [employer] for home O2, AC to drop off portable tank at bedside for dc home. Spouse reports he will be able to pick up patient from hospital.  [employer] rep to notify CM when concentrator is in place and patient can dc after that.  Patient states she does not need rolling walker or 3-in-1.  HH previously arranged with [facility] for HHPT/OT.  Bedside RN please call spouse to arrange pick up time once we have confirmation of concentrator delivery.     Expected Discharge Plan: [place] Barriers to Discharge: No Barriers Identified  Expected Discharge Plan and Services Expected Discharge Plan: [place]   Discharge Planning Services: CM Consult Post Acute Care Choice: Durable Medical Equipment Living arrangements for the past 2 months: Single Family Home Expected Discharge Date: 05/25/19               DME Arranged: Oxygen DME Agency: [place] Date DME Agency Contacted: 05/25/19 Time DME Agency Contacted: 1204 Representative spoke with at DME Agency: leslie             Social Determinants of Health ([place]) Interventions    Readmission Risk Interventions No flowsheet data found.

## 2019-05-25 NOTE — Progress Notes (Signed)
Patient being discharged. All education, follow up, medications, discussed with and understood by patient. Iv deaccessed. Explained to patient how to use oxygen tank. All belongings accounted for by patient. Patient's husband will be here by 1630 for pickup. Will escort patient down when he has arrived.

## 2019-05-27 LAB — CULTURE, RESPIRATORY W GRAM STAIN: Culture: NORMAL

## 2019-07-08 ENCOUNTER — Telehealth: Payer: Self-pay | Admitting: *Deleted

## 2019-07-08 NOTE — Telephone Encounter (Signed)
The patient called in requesting a new patient appointment with Dr. Sallyanne Kuster to establish cardiac care. Her mother is a current patient. Appointment made for 07/20/2019.

## 2019-07-08 NOTE — Telephone Encounter (Signed)
Duplicate-opened in error. 

## 2019-07-20 ENCOUNTER — Other Ambulatory Visit: Payer: Self-pay

## 2019-07-20 ENCOUNTER — Encounter: Payer: Self-pay | Admitting: Cardiovascular Disease

## 2019-07-20 ENCOUNTER — Ambulatory Visit (INDEPENDENT_AMBULATORY_CARE_PROVIDER_SITE_OTHER): Payer: Commercial Managed Care - PPO | Admitting: Cardiovascular Disease

## 2019-07-20 VITALS — BP 140/92 | HR 75 | Ht 67.0 in | Wt 308.6 lb

## 2019-07-20 DIAGNOSIS — R7303 Prediabetes: Secondary | ICD-10-CM | POA: Diagnosis not present

## 2019-07-20 DIAGNOSIS — R0602 Shortness of breath: Secondary | ICD-10-CM

## 2019-07-20 DIAGNOSIS — E039 Hypothyroidism, unspecified: Secondary | ICD-10-CM

## 2019-07-20 DIAGNOSIS — E785 Hyperlipidemia, unspecified: Secondary | ICD-10-CM

## 2019-07-20 NOTE — Progress Notes (Signed)
Cardiology Office Note:    Date:  07/21/2019   ID:  Gina Conner, DOB 02-15-1966, MRN [id number]  PCP:  Manon Hilding, MD  Cardiologist:  No primary care provider on file. NEW Electrophysiologist:  None   Referring MD: Manon Hilding, MD   Chief Complaint  Patient presents with  . Shortness of Breath    History of Present Illness:    Gina Conner is a 54 y.o. female with a hx of morbid obesity, s/p gastric bypass, early surgical menopause (age 21), pre-diabetes, dyslipidemia, history of R leg DVT after ankle surgery, reported hypothyroidism (not on supplement), COVID-19 pneumonia in January 2021, presents with persistent dyspnea.  She has been sleeping in a recliner. Smothers and has cough if she tries to lie down in bed. O2 sat drops to 86% if she walks without O2, but is now walking about a block.  She is trying to gradually wean off the oxygen.  Gained 20 lb since hospital discharge (Donnie can't cook and has been buying fast food). Has low back and hip pain, but no chest pain.  She has chronic problems with ankle swelling and peripheral venous insufficiency.  Her mother, Gina Conner is also my patient and has had gastric bypass, AFib and CHF.  Past Medical History:  Diagnosis Date  . Hypothyroidism     Past Surgical History:  Procedure Laterality Date  . ABDOMINAL HYSTERECTOMY    . EXCISION/RELEASE BURSA HIP Right 02/06/2017   Procedure: Right hip bursectomy;  Surgeon: Gaynelle Arabian, MD;  Location: [facility];  Service: Orthopedics;  Laterality: Right;  . ROUX-EN-Y GASTRIC BYPASS  2002    Current Medications: No outpatient medications have been marked as taking for the 07/20/19 encounter (Office Visit) with Sanda Klein, MD.     Allergies:   Patient has no known allergies.   Social History   Socioeconomic History  . Marital status: Married    Spouse name: Not on file  . Number of children: Not on file  . Years of education: Not on file  . Highest  education level: Not on file  Occupational History  . Not on file  Tobacco Use  . Smoking status: Never Smoker  . Smokeless tobacco: Never Used  Substance and Sexual Activity  . Alcohol use: No    Comment: occ  . Drug use: No  . Sexual activity: Yes  Other Topics Concern  . Not on file  Social History Narrative  . Not on file   Social Determinants of Health   Financial Resource Strain:   . Difficulty of Paying Living Expenses: Not on file  Food Insecurity:   . Worried About Charity fundraiser in the Last Year: Not on file  . Ran Out of Food in the Last Year: Not on file  Transportation Needs:   . Lack of Transportation (Medical): Not on file  . Lack of Transportation (Non-Medical): Not on file  Physical Activity:   . Days of Exercise per Week: Not on file  . Minutes of Exercise per Session: Not on file  Stress:   . Feeling of Stress : Not on file  Social Connections:   . Frequency of Communication with Friends and Family: Not on file  . Frequency of Social Gatherings with Friends and Family: Not on file  . Attends Religious Services: Not on file  . Active Member of Clubs or Organizations: Not on file  . Attends Archivist Meetings: Not on file  .  Marital Status: Not on file     Family History: Her mother has a history of paroxysmal atrial fibrillation and diastolic heart failure ROS:   Please see the history of present illness.     All other systems reviewed and are negative.  EKGs/Labs/Other Studies Reviewed:    The following studies were reviewed today: Extensive review of hospital records from recent admission including imaging studies, ECG and labs  EKG:  EKG is ordered today.  The ekg ordered today demonstrates sinus rhythm with left axis deviation almost meeting criteria for left anterior fascicular block.  A similar pattern was seen on the EKG from January 8  Recent Labs: 05/25/2019: ALT 14; B Natriuretic Peptide 12.0; BUN 20; Creatinine, Ser  0.66; Hemoglobin 13.8; Magnesium 2.4; Platelets 367; Potassium 3.6; Sodium 140  Recent Lipid Panel    Component Value Date/Time   CHOL 121 05/23/2019 0043   TRIG 108 05/23/2019 0043   HDL 31 (L) 05/23/2019 0043   CHOLHDL 3.9 05/23/2019 0043   VLDL 22 05/23/2019 0043   LDLCALC 68 05/23/2019 0043    Physical Exam:    VS:  BP (!) 140/92   Pulse 75   Ht 5\' 7"  (1.702 m)   Wt (!) 308 lb 9.6 oz (140 kg)   SpO2 98%   BMI 48.33 kg/m     Wt Readings from Last 3 Encounters:  07/20/19 (!) 308 lb 9.6 oz (140 kg)  05/19/19 294 lb 15.6 oz (133.8 kg)  02/06/17 284 lb (128.8 kg)     GEN: Morbidly obese, well nourished, well developed in no acute distress HEENT: Normal NECK: No JVD; No carotid bruits LYMPHATICS: No lymphadenopathy CARDIAC: RRR, no murmurs, rubs, gallops RESPIRATORY:  Clear to auscultation without rales, wheezing or rhonchi  ABDOMEN: Soft, non-tender, non-distended MUSCULOSKELETAL: Bilateral varicose veins of the lower extremities have 1+ ankle edema in a symmetrical pattern; No deformity  SKIN: Warm and dry NEUROLOGIC:  Alert and oriented x 3 PSYCHIATRIC:  Normal affect   ASSESSMENT:    1. Shortness of breath   2. Dyslipidemia   3. Prediabetes   4. Morbid obesity ([place])   5. Acquired hypothyroidism    PLAN:    In order of problems listed above:  1. Dyspnea: Some of the complaints are concerning for congestive heart failure, especially the occurrence of cough when she tries to lie flat and the rapid weight gain.  She might be expressing orthopnea.  We will schedule for an echocardiogram.  No evidence of active myocarditis on her ECG.  Her dyspnea may simply be related to severe obesity and gradual recovery from COVID-19 pneumonia.  If the echocardiogram shows regional wall motion abnormalities with depressed EF she should undergo a coronary work-up. 2. Dyslipidemia: She has a remarkably low HDL cholesterol and her hemoglobin A1c of 6.2% places in the prediabetes  category.  She is at risk of developing full-blown diabetes and numerous coronary/vascular complications.  Discussed the importance of weight loss.  Reviewed the concept of the glycemic index, avoiding sweets and starches with high glycemic index.  Gradual increase physical activity.  Increase the intake of lean protein and unsaturated fats, avoiding high saturated fat products and processed foods. 3. Prediabetes   4. Morbid obesity: Present even though she has had a Roux-en-Y gastric bypass. 5. Hypothyroidism: Needs to restart her thyroid supplement.  Hypothyroidism could also explain weight gain and poor functional status.  Asked her to get in touch with her provider.  No recent TSH.  Medication Adjustments/Labs and Tests Ordered: Current medicines are reviewed at length with the patient today.  Concerns regarding medicines are outlined above.  Orders Placed This Encounter  Procedures  . EKG 12-Lead  . ECHOCARDIOGRAM COMPLETE   No orders of the defined types were placed in this encounter.   Patient Instructions  Medication Instructions:  None medication *If you need a refill on your cardiac medications before your next appointment, please call your pharmacy*   Lab Work: None ordered If you have labs (blood work) drawn today and your tests are completely normal, you will receive your results only by: Marland Kitchen MyChart Message (if you have MyChart) OR . A paper copy in the mail If you have any lab test that is abnormal or we need to change your treatment, we will call you to review the results.   Testing/Procedures: Your physician has requested that you have an echocardiogram. Echocardiography is a painless test that uses sound waves to create images of your heart. It provides your doctor with information about the size and shape of your heart and how well your heart's chambers and valves are working. You may receive an ultrasound enhancing agent through an IV if needed to better visualize  your heart during the echo.This procedure takes approximately one hour. There are no restrictions for this procedure. This will take place at the [address].     Follow-Up: At [facility], you and your health needs are our priority.  As part of our continuing mission to provide you with exceptional heart care, we have created designated Provider Care Teams.  These Care Teams include your primary Cardiologist (physician) and Advanced Practice Providers (APPs -  Physician Assistants and Nurse Practitioners) who all work together to provide you with the care you need, when you need it.  We recommend signing up for the patient portal called "MyChart".  Sign up information is provided on this After Visit Summary.  MyChart is used to connect with patients for Virtual Visits (Telemedicine).  Patients are able to view lab/test results, encounter notes, upcoming appointments, etc.  Non-urgent messages can be sent to your provider as well.   To learn more about what you can do with MyChart, go to [email or web address].    Your next appointment:   Follow up as needed with Dr. Sallyanne Kuster.      Signed, Sanda Klein, MD  07/21/2019 5:30 PM    [place]

## 2019-07-20 NOTE — Patient Instructions (Signed)
Medication Instructions:  None medication *If you need a refill on your cardiac medications before your next appointment, please call your pharmacy*   Lab Work: None ordered If you have labs (blood work) drawn today and your tests are completely normal, you will receive your results only by: Marland Kitchen MyChart Message (if you have MyChart) OR . A paper copy in the mail If you have any lab test that is abnormal or we need to change your treatment, we will call you to review the results.   Testing/Procedures: Your physician has requested that you have an echocardiogram. Echocardiography is a painless test that uses sound waves to create images of your heart. It provides your doctor with information about the size and shape of your heart and how well your heart's chambers and valves are working. You may receive an ultrasound enhancing agent through an IV if needed to better visualize your heart during the echo.This procedure takes approximately one hour. There are no restrictions for this procedure. This will take place at the [address].     Follow-Up: At [facility], you and your health needs are our priority.  As part of our continuing mission to provide you with exceptional heart care, we have created designated Provider Care Teams.  These Care Teams include your primary Cardiologist (physician) and Advanced Practice Providers (APPs -  Physician Assistants and Nurse Practitioners) who all work together to provide you with the care you need, when you need it.  We recommend signing up for the patient portal called "MyChart".  Sign up information is provided on this After Visit Summary.  MyChart is used to connect with patients for Virtual Visits (Telemedicine).  Patients are able to view lab/test results, encounter notes, upcoming appointments, etc.  Non-urgent messages can be sent to your provider as well.   To learn more about what you can do with MyChart, go to  [email or web address].    Your next appointment:   Follow up as needed with Dr. Sallyanne Kuster.

## 2019-07-30 ENCOUNTER — Encounter: Payer: Self-pay | Admitting: Internal Medicine

## 2019-07-30 ENCOUNTER — Other Ambulatory Visit: Payer: Self-pay

## 2019-07-30 ENCOUNTER — Ambulatory Visit (INDEPENDENT_AMBULATORY_CARE_PROVIDER_SITE_OTHER): Payer: Commercial Managed Care - PPO | Admitting: Internal Medicine

## 2019-07-30 VITALS — BP 136/86 | HR 96 | Ht 67.0 in | Wt 308.0 lb

## 2019-07-30 DIAGNOSIS — U071 COVID-19: Secondary | ICD-10-CM

## 2019-07-30 DIAGNOSIS — R0602 Shortness of breath: Secondary | ICD-10-CM | POA: Diagnosis not present

## 2019-07-30 MED ORDER — IPRATROPIUM-ALBUTEROL 0.5-2.5 (3) MG/3ML IN SOLN: 3.0000 mL | Freq: Four times a day (QID) | RESPIRATORY_TRACT | 3 refills | Status: AC | PRN

## 2019-07-30 NOTE — Progress Notes (Signed)
Gina Conner    [id number]    [birth year]  Primary Care Physician:Sasser, Silvestre Moment, MD  Referring Physician: Manon Hilding, MD [place],  [place] [address] Reason for Consultation: shortness of breath Date of Consultation: 07/30/2019  Chief complaint:   Chief Complaint  Patient presents with  . Pulmonary Consult     HPI: Hospitalized for respiratory failure due to covid 19 infection. Treated with steroids and remdesevir. Discharged on 1LNC and prednisone taper.  Cough, shortness of breath, body aches since discharge.  Feeling scared and nervous about what the long term consequences of covid will be. Overall feels like she is improving since discharge. Has puls-oximeter at home, drops down to 85% on room air with exertion. Having fatigue, paresthesias.   Had bronchitis prior to covid, but it wasn't every year.  Had xray done at PCP's office and she has brought the disc with her - says it looked better.   Takes ventolin inhaler but isn't sure if it helps her.    Social history:  Occupation: retired from [employer] after 30 years. 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  Smoking history: never smoker, no significant passive smoke exposure  Social History   Occupational History  . Not on file  Tobacco Use  . Smoking status: Never Smoker  . Smokeless tobacco: Never Used  Substance and Sexual Activity  . Alcohol use: No    Comment: occ  . Drug use: No  . Sexual activity: Yes    Relevant family history:  Family History  Problem Relation Age of Onset  . Pulmonary embolism Mother     Past Medical History:  Diagnosis Date  . DVT (deep venous thrombosis) ([place])    provoked during tendon surgery was treated 3 months of coumadin  . Hypothyroidism     Past Surgical History:  Procedure Laterality Date  . ABDOMINAL HYSTERECTOMY    . ACHILLES TENDON REPAIR    . EXCISION/RELEASE BURSA HIP Right 02/06/2017   Procedure: Right hip  bursectomy;  Surgeon: Gaynelle Arabian, MD;  Location: [facility];  Service: Orthopedics;  Laterality: Right;  . ROUX-EN-Y GASTRIC BYPASS  2002     Review of systems: Review of Systems  Constitutional: Negative for chills, fever and weight loss.  HENT: Negative for congestion, sinus pain and sore throat.   Eyes: Negative for discharge and redness.  Respiratory: Positive for cough and shortness of breath. Negative for hemoptysis, sputum production and wheezing.   Cardiovascular: Negative for chest pain, palpitations and leg swelling.  Gastrointestinal: Negative for heartburn, nausea and vomiting.  Musculoskeletal: Negative for joint pain and myalgias.  Skin: Negative for rash.  Neurological: Negative for dizziness, tremors, focal weakness and headaches.  Endo/Heme/Allergies: Negative for environmental allergies.  Psychiatric/Behavioral: Negative for depression. The patient is nervous/anxious.   All other systems reviewed and are negative.   Physical Exam: Blood pressure 136/86, pulse 96, height 5\' 7"  (1.702 m), weight (!) 308 lb (139.7 kg), SpO2 100 %. Gen:      No acute distress ENT:  no nasal polyps, mucus membranes moist Lungs:    No increased respiratory effort, symmetric chest wall excursion, clear to auscultation bilaterally, no wheezes or crackles CV:         Regular rate and rhythm; no murmurs, rubs, or gallops.  No pedal edema Abd:      + bowel sounds; soft, non-tender; no distension MSK: no acute synovitis  of DIP or PIP joints, no mechanics hands.  Skin:      Warm and dry; no rashes Neuro: normal speech, no focal facial asymmetry Psych: alert and oriented x3, anxious, tearful.    Data Reviewed/Medical Decision Making:  Independent interpretation of tests: Imaging: . Review of patient's chest xray images revealed patchy bilateral airspace disease consistent with multifocal pneumonia. The patient's images have been independently reviewed by me.    PFTs: None on file Labs:   Lab Results  Component Value Date   WBC 12.0 (H) 05/25/2019   HGB 13.8 05/25/2019   HCT 43.1 05/25/2019   MCV 95.1 05/25/2019   PLT 367 05/25/2019   Lab Results  Component Value Date   NA 140 05/25/2019   K 3.6 05/25/2019   CL 99 05/25/2019   CO2 28 05/25/2019     Immunization status:   There is no immunization history on file for this patient.  . I reviewed prior external note(s) from inpatient stay Jan 2021.  . I reviewed the result(s) of the labs and imaging as noted above.  . I have ordered spirometry  Assessment:  Shortness of breath COVID 19 Pneumonia  Plan/Recommendations: Will prescribe nebulizer machine with duonebs to see if she has any benefit. Continue oxygen therapy at this time. Can consider desaturation study at next visit to see if she still needs it.  Will refer to pulmonary rehab at [facility] hospital for COVID 19 pneumonia related dyspnea.  Will order PFTs.   We discussed disease management and progression at length today.   Return in about 3 months (around 10/30/2019).  Lenice Llamas, MD Pulmonary and Critical Care Medicine [place]  CC: Sasser, Silvestre Moment, MD

## 2019-08-13 ENCOUNTER — Other Ambulatory Visit: Payer: Self-pay

## 2019-08-13 ENCOUNTER — Ambulatory Visit (INDEPENDENT_AMBULATORY_CARE_PROVIDER_SITE_OTHER): Payer: Commercial Managed Care - PPO

## 2019-08-13 DIAGNOSIS — R0602 Shortness of breath: Secondary | ICD-10-CM | POA: Diagnosis not present

## 2019-08-17 ENCOUNTER — Telehealth: Payer: Self-pay | Admitting: Cardiovascular Disease

## 2019-08-17 NOTE — Telephone Encounter (Signed)
  Patient is returning a call for echo results

## 2019-08-17 NOTE — Telephone Encounter (Signed)
Results given.

## 2019-09-23 ENCOUNTER — Other Ambulatory Visit: Payer: Self-pay

## 2019-09-23 ENCOUNTER — Encounter (HOSPITAL_COMMUNITY)
Admission: RE | Admit: 2019-09-23 | Discharge: 2019-09-23 | Disposition: A | Discharge status: Home / Self Care | Payer: Commercial Managed Care - PPO | Source: Ambulatory Visit | Attending: Internal Medicine | Admitting: Internal Medicine

## 2019-09-23 VITALS — BP 110/80 | HR 76 | Temp 96.0°F | Ht 67.0 in | Wt 311.3 lb

## 2019-09-23 DIAGNOSIS — R0602 Shortness of breath: Secondary | ICD-10-CM

## 2019-09-23 NOTE — Progress Notes (Signed)
Cardiac/Pulmonary Rehab Medication Review by a Pharmacist  Does the patient  feel that his/her medications are working for him/her?  yes  Has the patient been experiencing any side effects to the medications prescribed?  no  Does the patient measure his/her own blood pressure or blood glucose at home?  no   Does the patient have any problems obtaining medications due to transportation or finances?   no  Understanding of regimen: excellent Understanding of indications: excellent Potential of compliance: excellent  Questions asked to Determine Patient Understanding of Medication Regimen:  1. What is the name of the medication?  2. What is the medication used for?  3. When should it be taken?  4. How much should be taken?  5. How will you take it?  6. What side effects should you report?  Understanding Defined as: Excellent: All questions above are correct Good: Questions 1-4 are correct Fair: Questions 1-2 are correct  Poor: 1 or none of the above questions are correct   Pharmacist comments: Patient feels that meds are working for her and has no side effects or issues obtaining medications. Patient no longer takes [place] Thyroid, but takes levothyroxine 88 mcg daily and spironolactone 25mg  qhs.  I clarified the new medication strengths with [place], [place] at [place].    Despina Pole 09/23/2019 8:57 AM

## 2019-09-23 NOTE — Progress Notes (Signed)
Daily Session Note  Patient Details  Name: Gina Conner MRN: [id number] Date of Birth: 1965/09/09 Referring Provider:     PULMONARY REHAB OTHER RESP ORIENTATION from 09/23/2019 in [place]  Referring Provider  Dr. Shearon Stalls  [facility])       Encounter Date: 09/23/2019  Check In: Session Check In - 09/23/19 0800      Check-In   Supervising physician immediately available to respond to emergencies  See telemetry face sheet for immediately available MD    Location  [facility]    Staff Present  Russella Dar, MS, EP, [facility], Exercise Physiologist;Debra Wynetta Emery, RN, BSN;Other    Virtual Visit  No    Medication changes reported      No    Fall or balance concerns reported     No    Tobacco Cessation  No Change   Never smoked   Warm-up and Cool-down  Performed as group-led instruction    Resistance Training Performed  Yes    VAD Patient?  No    PAD/SET Patient?  No      Pain Assessment   Currently in Pain?  No/denies    Pain Score  0-No pain    Multiple Pain Sites  No       Capillary Blood Glucose: No results found for this or any previous visit (from the past 24 hour(s)).    Social History   Tobacco Use  Smoking Status Never Smoker  Smokeless Tobacco Never Used    Goals Met:  Proper associated with RPD/PD & O2 Sat Independence with exercise equipment Improved SOB with ADL's Exercise tolerated well Personal goals reviewed Queuing for purse lip breathing No report of cardiac concerns or symptoms Strength training completed today  Goals Unmet:  Not Applicable  Comments: Check out: 1030   Dr. Kate Sable is Medical Director for Center and Pulmonary Rehab.

## 2019-09-23 NOTE — Progress Notes (Signed)
Cardiac Individual Treatment Plan  Patient Details  Name: Gina Conner MRN: [id number] Date of Birth: [birth year] Referring Provider:     PULMONARY REHAB OTHER RESP ORIENTATION from 09/23/2019 in [place]  Referring Provider  Dr. Shearon Stalls      Initial Encounter Date:    [facility] from 09/23/2019 in [place]  Date  09/23/19      Visit Diagnosis: SOB (shortness of breath)  Patient's Home Medications on Admission:  Current Outpatient Medications:  .  acetaminophen (TYLENOL) 500 MG tablet, Take 1,000 mg by mouth every 6 (six) hours as needed for mild pain or headache. , Disp: , Rfl:  .  albuterol (VENTOLIN HFA) 108 (90 Base) MCG/ACT inhaler, Inhale 2 puffs into the lungs every 6 (six) hours as needed for wheezing or shortness of breath., Disp: 6.7 g, Rfl: 0 .  ALPRAZolam (XANAX) 0.5 MG tablet, Take 0.5 mg by mouth at bedtime as needed for sleep., Disp: , Rfl:  .  Cholecalciferol (D 5000) 5000 units capsule, Take 5,000 Units by mouth daily., Disp: , Rfl:  .  ipratropium-albuterol (DUONEB) 0.5-2.5 (3) MG/3ML SOLN, Take 3 mLs by nebulization every 6 (six) hours as needed (shortness of breath)., Disp: 360 mL, Rfl: 3 .  levothyroxine (SYNTHROID) 88 MCG tablet, Take 88 mcg by mouth daily before breakfast. Pt unsure of strength. , Disp: , Rfl:  .  spironolactone (ALDACTONE) 25 MG tablet, Take 25 mg by mouth at bedtime., Disp: , Rfl:  .  NP THYROID 60 MG tablet, Take 60 mg by mouth daily before breakfast., Disp: , Rfl:   Past Medical History: Past Medical History:  Diagnosis Date  . DVT (deep venous thrombosis) ([place])    provoked during tendon surgery was treated 3 months of coumadin  . Hypothyroidism     Tobacco Use: Social History   Tobacco Use  Smoking Status Never Smoker  Smokeless Tobacco Never Used    Labs: Recent Review Flowsheet Data    Labs for ITP Cardiac and Pulmonary Rehab Latest Ref Rng &  Units 05/18/2019 05/22/2019 05/23/2019   Cholestrol 0 - 200 mg/dL - - 121   LDLCALC 0 - 99 mg/dL - - 68   HDL >40 mg/dL - - 31(L)   Trlycerides <150 mg/dL 126 - 108   Hemoglobin A1c 4.8 - 5.6 % - 6.2(H) -      Capillary Blood Glucose: No results found for: GLUCAP   Exercise Target Goals: Exercise Program Goal: Individual exercise prescription set using results from initial 6 min walk test and THRR while considering  patient's activity barriers and safety.   Exercise Prescription Goal: Starting with aerobic activity 30 plus minutes a day, 3 days per week for initial exercise prescription. Provide home exercise prescription and guidelines that participant acknowledges understanding prior to discharge.  Activity Barriers & Risk Stratification: Activity Barriers & Cardiac Risk Stratification - 09/23/19 1142      Activity Barriers & Cardiac Risk Stratification   Activity Barriers  Deconditioning;Shortness of Breath    Cardiac Risk Stratification  Low       6 Minute Walk: 6 Minute Walk    Row Name 09/23/19 1139         6 Minute Walk   Phase  Initial     Distance  1200 feet     Walk Time  6 minutes     # of Rest Breaks  0     MPH  2.27  METS  2.74     RPE  13     Perceived Dyspnea   13     VO2 Peak  9.44     Symptoms  No     Resting HR  76 bpm     Resting BP  110/80     Resting Oxygen Saturation   97 %     Exercise Oxygen Saturation  during 6 min walk  94 %     Max Ex. HR  110 bpm     Max Ex. BP  146/106     2 Minute Post BP  110/98        Oxygen Initial Assessment: Oxygen Initial Assessment - 09/23/19 1135      Home Oxygen   Home Oxygen Device  E-Tanks    Sleep Oxygen Prescription  None    Home Exercise Oxygen Prescription  Continuous    Liters per minute  1    Home at Rest Exercise Oxygen Prescription  None    Compliance with Home Oxygen Use  Yes      Initial 6 min Walk   Oxygen Used  None      Program Oxygen Prescription   Program Oxygen Prescription   Continuous   As needed   Liters per minute  2      Intervention   Short Term Goals  To learn and exhibit compliance with exercise, home and travel O2 prescription;To learn and understand importance of maintaining oxygen saturations>88%;To learn and demonstrate proper pursed lip breathing techniques or other breathing techniques.    Long  Term Goals  Exhibits compliance with exercise, home and travel O2 prescription;Maintenance of O2 saturations>88%       Oxygen Re-Evaluation:   Oxygen Discharge (Final Oxygen Re-Evaluation):   Initial Exercise Prescription: Initial Exercise Prescription - 09/23/19 1100      Date of Initial Exercise RX and Referring Provider   Date  09/23/19    Referring Provider  Dr. Shearon Stalls    Expected Discharge Date  12/24/19      Treadmill   MPH  1.2    Grade  0    Minutes  17    METs  1.91      T5 Nustep   Level  1    SPM  73    Minutes  22    METs  1.7      Prescription Details   Frequency (times per week)  2    Duration  Progress to 30 minutes of continuous aerobic without signs/symptoms of physical distress      Intensity   Ratings of Perceived Exertion  11-13    Perceived Dyspnea  0-4      Progression   Progression  Continue progressive overload as per policy without signs/symptoms or physical distress.      Resistance Training   Training Prescription  Yes    Weight  1    Reps  10-15       Perform Capillary Blood Glucose checks as needed.  Exercise Prescription Changes:   Exercise Comments:  Exercise Comments    Row Name 09/23/19 1153           Exercise Comments  Patient preformed well just got of little short of breath. Patient is looking foward to starting the program.          Exercise Goals and Review:  Exercise Goals    Row Name 09/23/19 1151  Exercise Goals   Increase Physical Activity  Yes       Intervention  Provide advice, education, support and counseling about physical activity/exercise  needs.;Develop an individualized exercise prescription for aerobic and resistive training based on initial evaluation findings, risk stratification, comorbidities and participant's personal goals.       Expected Outcomes  Short Term: Attend rehab on a regular basis to increase amount of physical activity.;Long Term: Add in home exercise to make exercise part of routine and to increase amount of physical activity.       Increase Strength and Stamina  Yes       Intervention  Provide advice, education, support and counseling about physical activity/exercise needs.;Develop an individualized exercise prescription for aerobic and resistive training based on initial evaluation findings, risk stratification, comorbidities and participant's personal goals.       Expected Outcomes  Long Term: Improve cardiorespiratory fitness, muscular endurance and strength as measured by increased METs and functional capacity (6MWT);Short Term: Perform resistance training exercises routinely during rehab and add in resistance training at home       Able to understand and use rate of perceived exertion (RPE) scale  Yes       Intervention  Provide education and explanation on how to use RPE scale       Expected Outcomes  Short Term: Able to use RPE daily in rehab to express subjective intensity level;Long Term:  Able to use RPE to guide intensity level when exercising independently       Able to understand and use Dyspnea scale  Yes       Intervention  Provide education and explanation on how to use Dyspnea scale       Expected Outcomes  Short Term: Able to use Dyspnea scale daily in rehab to express subjective sense of shortness of breath during exertion;Long Term: Able to use Dyspnea scale to guide intensity level when exercising independently       Knowledge and understanding of Target Heart Rate Range (THRR)  Yes       Intervention  Provide education and explanation of THRR including how the numbers were predicted and where  they are located for reference       Expected Outcomes  Long Term: Able to use THRR to govern intensity when exercising independently;Short Term: Able to state/look up THRR       Able to check pulse independently  Yes       Intervention  Provide education and demonstration on how to check pulse in carotid and radial arteries.;Review the importance of being able to check your own pulse for safety during independent exercise       Expected Outcomes  Short Term: Able to explain why pulse checking is important during independent exercise;Long Term: Able to check pulse independently and accurately       Understanding of Exercise Prescription  Yes       Intervention  Provide education, explanation, and written materials on patient's individual exercise prescription       Expected Outcomes  Short Term: Able to explain program exercise prescription;Long Term: Able to explain home exercise prescription to exercise independently          Exercise Goals Re-Evaluation :    Discharge Exercise Prescription (Final Exercise Prescription Changes):   Nutrition:  Target Goals: Understanding of nutrition guidelines, daily intake of sodium 1500mg , cholesterol 200mg , calories 30% from fat and 7% or less from saturated fats, daily to have 5 or more servings of  fruits and vegetables.  Biometrics: Pre Biometrics - 09/23/19 1156      Pre Biometrics   Height  5\' 7"  (1.702 m)    Weight  (!) 311 lb 4.8 oz (141.2 kg)    Waist Circumference  55 inches    Hip Circumference  60 inches    Waist to Hip Ratio  0.92 %    BMI (Calculated)  48.75    Triceps Skinfold  15 mm    % Body Fat  52.3 %    Grip Strength  25.53 kg    Flexibility  14.33 in    Single Leg Stand  9.29 seconds        Nutrition Therapy Plan and Nutrition Goals: Nutrition Therapy & Goals - 09/23/19 1140      Personal Nutrition Goals   Personal Goal #2  Patient is eating heart healthy at this time. Handout given on how to maintain heart  healthy diet.    Additional Goals?  No      Intervention Plan   Intervention  Nutrition handout(s) given to patient.       Nutrition Assessments: Nutrition Assessments - 09/23/19 1141      MEDFICTS Scores   Pre Score  35       Nutrition Goals Re-Evaluation:   Nutrition Goals Discharge (Final Nutrition Goals Re-Evaluation):   Psychosocial: Target Goals: Acknowledge presence or absence of significant depression and/or stress, maximize coping skills, provide positive support system. Participant is able to verbalize types and ability to use techniques and skills needed for reducing stress and depression.  Initial Review & Psychosocial Screening: Initial Psych Review & Screening - 09/23/19 1138      Initial Review   Current issues with  Current Depression   All due to having COVID-19 and not being able to do ADL's     Success?  Yes      Barriers   Psychosocial barriers to participate in program  The patient should benefit from training in stress management and relaxation.      Screening Interventions   Interventions  Encouraged to exercise    Expected Outcomes  Short Term goal: Identification and review with participant of any Quality of Life or Depression concerns found by scoring the questionnaire.;Long Term goal: The participant improves quality of Life and PHQ9 Scores as seen by post scores and/or verbalization of changes       Quality of Life Scores: Quality of Life - 09/23/19 1200      Quality of Life   Select  Quality of Life      Quality of Life Scores   Health/Function Pre  13.31 %    Socioeconomic Pre  25.5 %    Psych/Spiritual Pre  15.43 %    Family Pre  30 %    GLOBAL Pre  18.14 %      Scores of 19 and below usually indicate a poorer quality of life in these areas.  A difference of  2-3 points is a clinically meaningful difference.  A difference of 2-3 points in the total score of the Quality of Life Index has been  associated with significant improvement in overall quality of life, self-image, physical symptoms, and general health in studies assessing change in quality of life.  PHQ-9: Recent Review Flowsheet Data    Depression screen [facility] 2/9 09/23/2019   Decreased Interest 0   Down, Depressed, Hopeless 1   PHQ - 2 Score 1  Altered sleeping 2   Tired, decreased energy 3   Change in appetite 1   Feeling bad or failure about yourself  1   Trouble concentrating 1   Moving slowly or fidgety/restless 0   Suicidal thoughts 0   PHQ-9 Score 9   Difficult doing work/chores Very difficult     Interpretation of Total Score  Total Score Depression Severity:  1-4 = Minimal depression, 5-9 = Mild depression, 10-14 = Moderate depression, 15-19 = Moderately severe depression, 20-27 = Severe depression   Psychosocial Evaluation and Intervention: Psychosocial Evaluation - 09/23/19 1139      Psychosocial Evaluation & Interventions   Interventions  Stress management education;Encouraged to exercise with the program and follow exercise prescription;Relaxation education    Continue Psychosocial Services   Follow up required by staff       Psychosocial Re-Evaluation:   Psychosocial Discharge (Final Psychosocial Re-Evaluation):   Vocational Rehabilitation: Provide vocational rehab assistance to qualifying candidates.   Vocational Rehab Evaluation & Intervention: Vocational Rehab - 09/23/19 1142      Initial Vocational Rehab Evaluation & Intervention   Assessment shows need for Vocational Rehabilitation  No   Patient is retired.      Education: Education Goals: Education classes will be provided on a weekly basis, covering required topics. Participant will state understanding/return demonstration of topics presented.  Learning Barriers/Preferences: Learning Barriers/Preferences - 09/23/19 1141      Learning Barriers/Preferences   Learning Barriers  None    Learning Preferences   Audio;Computer/Internet;Group Instruction;Individual Instruction;Pictoral;Skilled Demonstration;Verbal Instruction;Video;Written Material       Education Topics: Hypertension, Hypertension Reduction -Define heart disease and high blood pressure. Discus how high blood pressure affects the body and ways to reduce high blood pressure.   Exercise and Your Heart -Discuss why it is important to exercise, the FITT principles of exercise, normal and abnormal responses to exercise, and how to exercise safely.   Angina -Discuss definition of angina, causes of angina, treatment of angina, and how to decrease risk of having angina.   Cardiac Medications -Review what the following cardiac medications are used for, how they affect the body, and side effects that may occur when taking the medications.  Medications include Aspirin, Beta blockers, calcium channel blockers, ACE Inhibitors, angiotensin receptor blockers, diuretics, digoxin, and antihyperlipidemics.   Congestive Heart Failure -Discuss the definition of CHF, how to live with CHF, the signs and symptoms of CHF, and how keep track of weight and sodium intake.   Heart Disease and Intimacy -Discus the effect sexual activity has on the heart, how changes occur during intimacy as we age, and safety during sexual activity.   Smoking Cessation / COPD -Discuss different methods to quit smoking, the health benefits of quitting smoking, and the definition of COPD.   Nutrition I: Fats -Discuss the types of cholesterol, what cholesterol does to the heart, and how cholesterol levels can be controlled.   Nutrition II: Labels -Discuss the different components of food labels and how to read food label   Heart Parts/Heart Disease and PAD -Discuss the anatomy of the heart, the pathway of blood circulation through the heart, and these are affected by heart disease.   Stress I: Signs and Symptoms -Discuss the causes of stress, how stress may  lead to anxiety and depression, and ways to limit stress.   Stress II: Relaxation -Discuss different types of relaxation techniques to limit stress.   Warning Signs of Stroke / TIA -Discuss definition of a stroke, what the signs  and symptoms are of a stroke, and how to identify when someone is having stroke.   Knowledge Questionnaire Score: Knowledge Questionnaire Score - 09/23/19 1141      Knowledge Questionnaire Score   Pre Score  14/18       Core Components/Risk Factors/Patient Goals at Admission: Personal Goals and Risk Factors at Admission - 09/23/19 1142      Core Components/Risk Factors/Patient Goals on Admission    Weight Management  Yes    Intervention  Weight Management/Obesity: Establish reasonable short term and long term weight goals.    Admit Weight  311 lb 4.8 oz (141.2 kg)    Goal Weight: Short Term  286 lb 4.8 oz (129.9 kg)    Goal Weight: Long Term  236 lb 4.8 oz (107.2 kg)    Expected Outcomes  Short Term: Continue to assess and modify interventions until short term weight is achieved;Long Term: Adherence to nutrition and physical activity/exercise program aimed toward attainment of established weight goal    Personal Goal Other  Yes    Personal Goal  Get lungs stronger, Lose weight, get off oxygen, be healthier overall.    Intervention  Attend class 2 x week and to supplement with at home exercise plan that was given 3 x week.    Expected Outcomes  Reach expected goals.       Core Components/Risk Factors/Patient Goals Review:    Core Components/Risk Factors/Patient Goals at Discharge (Final Review):    ITP Comments: ITP Comments    Row Name 09/23/19 1131           ITP Comments  Patient is being sent by her pulmonary doctor due to having COVID-19. she is eager to get started and get stronger. Her goals are to gain lung strength, come off oxygen, and just have better health.          Comments: Patient arrived for 1st visit/orientation/education  at 0800. Patient was referred to PR by Dr. Shearon Stalls due to SOB (R06.2). During orientation advised patient on arrival and appointment times what to wear, what to do before, during and after exercise. Reviewed attendance and class policy. Talked about inclement weather and class consultation policy. Pt is scheduled to return Pulmonary Rehab on 09/24/19 at 1445. Pt was advised to come to class 15 minutes before class starts. Patient was also given instructions on meeting with the dietician and attending the Family Structure classes. Discussed RPE/Dpysnea scales. Discussed initial THR and how to find their radial and/or carotid pulse. Discussed the initial exercise prescription and how this effects their progress. Pt is eager to get started. Patient participated in warm up stretches followed by light weights and resistance bands. Patient was able to complete 6 minute walk test. Patient did not c/o pain during or after walk test. Discussed equipment safety with patient. Took patient pre-anthropometric measurements. Patient finished visit at 1030.

## 2019-09-23 NOTE — Progress Notes (Signed)
Pulmonary Individual Treatment Plan  Patient Details  Name: Gina Conner MRN: [id number] Date of Birth: 04/03/1966 Referring Provider:     PULMONARY REHAB OTHER RESP ORIENTATION from 09/23/2019 in [place]  Referring Provider  Dr. Shearon Stalls      Initial Encounter Date:    [place] from 09/23/2019 in [place]  Date  09/23/19      Visit Diagnosis: SOB (shortness of breath)  Patient's Home Medications on Admission:   Current Outpatient Medications:  .  acetaminophen (TYLENOL) 500 MG tablet, Take 1,000 mg by mouth every 6 (six) hours as needed for mild pain or headache. , Disp: , Rfl:  .  albuterol (VENTOLIN HFA) 108 (90 Base) MCG/ACT inhaler, Inhale 2 puffs into the lungs every 6 (six) hours as needed for wheezing or shortness of breath., Disp: 6.7 g, Rfl: 0 .  ALPRAZolam (XANAX) 0.5 MG tablet, Take 0.5 mg by mouth at bedtime as needed for sleep., Disp: , Rfl:  .  Cholecalciferol (D 5000) 5000 units capsule, Take 5,000 Units by mouth daily., Disp: , Rfl:  .  ipratropium-albuterol (DUONEB) 0.5-2.5 (3) MG/3ML SOLN, Take 3 mLs by nebulization every 6 (six) hours as needed (shortness of breath)., Disp: 360 mL, Rfl: 3 .  levothyroxine (SYNTHROID) 88 MCG tablet, Take 88 mcg by mouth daily before breakfast. Pt unsure of strength. , Disp: , Rfl:  .  spironolactone (ALDACTONE) 25 MG tablet, Take 25 mg by mouth at bedtime., Disp: , Rfl:  .  NP THYROID 60 MG tablet, Take 60 mg by mouth daily before breakfast., Disp: , Rfl:   Past Medical History: Past Medical History:  Diagnosis Date  . DVT (deep venous thrombosis) ([place])    provoked during tendon surgery was treated 3 months of coumadin  . Hypothyroidism     Tobacco Use: Social History   Tobacco Use  Smoking Status Never Smoker  Smokeless Tobacco Never Used    Labs: Recent Review Flowsheet Data    Labs for ITP Cardiac and Pulmonary Rehab Latest Ref Rng &  Units 05/18/2019 05/22/2019 05/23/2019   Cholestrol 0 - 200 mg/dL - - 121   LDLCALC 0 - 99 mg/dL - - 68   HDL >40 mg/dL - - 31(L)   Trlycerides <150 mg/dL 126 - 108   Hemoglobin A1c 4.8 - 5.6 % - 6.2(H) -      Capillary Blood Glucose: No results found for: GLUCAP   Pulmonary Assessment Scores: Pulmonary Assessment Scores    Row Name 09/23/19 1137         ADL UCSD   ADL Phase  Entry     SOB Score total  53     Rest  0     Walk  10     Stairs  4     Bath  2     Dress  2     Shop  3       CAT Score   CAT Score  20       mMRC Score   mMRC Score  3       UCSD: Self-administered rating of dyspnea associated with activities of daily living (ADLs) 6-point scale (0 = "not at all" to 5 = "maximal or unable to do because of breathlessness")  Scoring Scores range from 0 to 120.  Minimally important difference is 5 units  CAT: CAT can identify the health impairment of COPD patients and is better correlated with  disease progression.  CAT has a scoring range of zero to 40. The CAT score is classified into four groups of low (less than 10), medium (10 - 20), high (21-30) and very high (31-40) based on the impact level of disease on health status. A CAT score over 10 suggests significant symptoms.  A worsening CAT score could be explained by an exacerbation, poor medication adherence, poor inhaler technique, or progression of COPD or comorbid conditions.  CAT MCID is 2 points  mMRC: mMRC (Modified Medical Research Council) Dyspnea Scale is used to assess the degree of baseline functional disability in patients of respiratory disease due to dyspnea. No minimal important difference is established. A decrease in score of 1 point or greater is considered a positive change.   Pulmonary Function Assessment:   Exercise Target Goals: Exercise Program Goal: Individual exercise prescription set using results from initial 6 min walk test and THRR while considering  patient's activity barriers  and safety.   Exercise Prescription Goal: Initial exercise prescription builds to 30-45 minutes a day of aerobic activity, 2-3 days per week.  Home exercise guidelines will be given to patient during program as part of exercise prescription that the participant will acknowledge.  Activity Barriers & Risk Stratification: Activity Barriers & Cardiac Risk Stratification - 09/23/19 1142      Activity Barriers & Cardiac Risk Stratification   Activity Barriers  Deconditioning;Shortness of Breath    Cardiac Risk Stratification  Low       6 Minute Walk: 6 Minute Walk    Row Name 09/23/19 1139         6 Minute Walk   Phase  Initial     Distance  1200 feet     Walk Time  6 minutes     # of Rest Breaks  0     MPH  2.27     METS  2.74     RPE  13     Perceived Dyspnea   13     VO2 Peak  9.44     Symptoms  No     Resting HR  76 bpm     Resting BP  110/80     Resting Oxygen Saturation   97 %     Exercise Oxygen Saturation  during 6 min walk  94 %     Max Ex. HR  110 bpm     Max Ex. BP  146/106     2 Minute Post BP  110/98        Oxygen Initial Assessment: Oxygen Initial Assessment - 09/23/19 1135      Home Oxygen   Home Oxygen Device  E-Tanks    Sleep Oxygen Prescription  None    Home Exercise Oxygen Prescription  Continuous    Liters per minute  1    Home at Rest Exercise Oxygen Prescription  None    Compliance with Home Oxygen Use  Yes      Initial 6 min Walk   Oxygen Used  None      Program Oxygen Prescription   Program Oxygen Prescription  Continuous   As needed   Liters per minute  2      Intervention   Short Term Goals  To learn and exhibit compliance with exercise, home and travel O2 prescription;To learn and understand importance of maintaining oxygen saturations>88%;To learn and demonstrate proper pursed lip breathing techniques or other breathing techniques.    Long  Term Goals  Exhibits compliance  with exercise, home and travel O2 prescription;Maintenance  of O2 saturations>88%       Oxygen Re-Evaluation:   Oxygen Discharge (Final Oxygen Re-Evaluation):   Initial Exercise Prescription: Initial Exercise Prescription - 09/23/19 1100      Date of Initial Exercise RX and Referring Provider   Date  09/23/19    Referring Provider  Dr. Shearon Stalls    Expected Discharge Date  12/24/19      Treadmill   MPH  1.2    Grade  0    Minutes  17    METs  1.91      T5 Nustep   Level  1    SPM  73    Minutes  22    METs  1.7      Prescription Details   Frequency (times per week)  2    Duration  Progress to 30 minutes of continuous aerobic without signs/symptoms of physical distress      Intensity   Ratings of Perceived Exertion  11-13    Perceived Dyspnea  0-4      Progression   Progression  Continue progressive overload as per policy without signs/symptoms or physical distress.      Resistance Training   Training Prescription  Yes    Weight  1    Reps  10-15       Perform Capillary Blood Glucose checks as needed.  Exercise Prescription Changes:   Exercise Comments:  Exercise Comments    Row Name 09/23/19 1153           Exercise Comments  Patient preformed well just got of little short of breath. Patient is looking foward to starting the program.          Exercise Goals and Review:  Exercise Goals    Row Name 09/23/19 1151             Exercise Goals   Increase Physical Activity  Yes       Intervention  Provide advice, education, support and counseling about physical activity/exercise needs.;Develop an individualized exercise prescription for aerobic and resistive training based on initial evaluation findings, risk stratification, comorbidities and participant's personal goals.       Expected Outcomes  Short Term: Attend rehab on a regular basis to increase amount of physical activity.;Long Term: Add in home exercise to make exercise part of routine and to increase amount of physical activity.       Increase Strength  and Stamina  Yes       Intervention  Provide advice, education, support and counseling about physical activity/exercise needs.;Develop an individualized exercise prescription for aerobic and resistive training based on initial evaluation findings, risk stratification, comorbidities and participant's personal goals.       Expected Outcomes  Long Term: Improve cardiorespiratory fitness, muscular endurance and strength as measured by increased METs and functional capacity (6MWT);Short Term: Perform resistance training exercises routinely during rehab and add in resistance training at home       Able to understand and use rate of perceived exertion (RPE) scale  Yes       Intervention  Provide education and explanation on how to use RPE scale       Expected Outcomes  Short Term: Able to use RPE daily in rehab to express subjective intensity level;Long Term:  Able to use RPE to guide intensity level when exercising independently       Able to understand and use Dyspnea scale  Yes  Intervention  Provide education and explanation on how to use Dyspnea scale       Expected Outcomes  Short Term: Able to use Dyspnea scale daily in rehab to express subjective sense of shortness of breath during exertion;Long Term: Able to use Dyspnea scale to guide intensity level when exercising independently       Knowledge and understanding of Target Heart Rate Range (THRR)  Yes       Intervention  Provide education and explanation of THRR including how the numbers were predicted and where they are located for reference       Expected Outcomes  Long Term: Able to use THRR to govern intensity when exercising independently;Short Term: Able to state/look up THRR       Able to check pulse independently  Yes       Intervention  Provide education and demonstration on how to check pulse in carotid and radial arteries.;Review the importance of being able to check your own pulse for safety during independent exercise       Expected  Outcomes  Short Term: Able to explain why pulse checking is important during independent exercise;Long Term: Able to check pulse independently and accurately       Understanding of Exercise Prescription  Yes       Intervention  Provide education, explanation, and written materials on patient's individual exercise prescription       Expected Outcomes  Short Term: Able to explain program exercise prescription;Long Term: Able to explain home exercise prescription to exercise independently          Exercise Goals Re-Evaluation :   Discharge Exercise Prescription (Final Exercise Prescription Changes):   Nutrition:  Target Goals: Understanding of nutrition guidelines, daily intake of sodium 1500mg , cholesterol 200mg , calories 30% from fat and 7% or less from saturated fats, daily to have 5 or more servings of fruits and vegetables.  Biometrics: Pre Biometrics - 09/23/19 1156      Pre Biometrics   Height  5\' 7"  (1.702 m)    Weight  (!) 311 lb 4.8 oz (141.2 kg)    Waist Circumference  55 inches    Hip Circumference  60 inches    Waist to Hip Ratio  0.92 %    BMI (Calculated)  48.75    Triceps Skinfold  15 mm    % Body Fat  52.3 %    Grip Strength  25.53 kg    Flexibility  14.33 in    Single Leg Stand  9.29 seconds        Nutrition Therapy Plan and Nutrition Goals: Nutrition Therapy & Goals - 09/23/19 1140      Personal Nutrition Goals   Personal Goal #2  Patient is eating heart healthy at this time. Handout given on how to maintain heart healthy diet.    Additional Goals?  No      Intervention Plan   Intervention  Nutrition handout(s) given to patient.       Nutrition Assessments: Nutrition Assessments - 09/23/19 1141      MEDFICTS Scores   Pre Score  35       Nutrition Goals Re-Evaluation:   Nutrition Goals Discharge (Final Nutrition Goals Re-Evaluation):   Psychosocial: Target Goals: Acknowledge presence or absence of significant depression and/or stress,  maximize coping skills, provide positive support system. Participant is able to verbalize types and ability to use techniques and skills needed for reducing stress and depression.  Initial Review & Psychosocial Screening: Initial Psych  Review & Screening - 09/23/19 1138      Initial Review   Current issues with  Current Depression   All due to having COVID-19 and not being able to do ADL's     [place]?  Yes      Barriers   Psychosocial barriers to participate in program  The patient should benefit from training in stress management and relaxation.      Screening Interventions   Interventions  Encouraged to exercise    Expected Outcomes  Short Term goal: Identification and review with participant of any Quality of Life or Depression concerns found by scoring the questionnaire.;Long Term goal: The participant improves quality of Life and PHQ9 Scores as seen by post scores and/or verbalization of changes       Quality of Life Scores: Quality of Life - 09/23/19 1200      Quality of Life   Select  Quality of Life      Quality of Life Scores   Health/Function Pre  13.31 %    Socioeconomic Pre  25.5 %    Psych/Spiritual Pre  15.43 %    Family Pre  30 %    GLOBAL Pre  18.14 %      Scores of 19 and below usually indicate a poorer quality of life in these areas.  A difference of  2-3 points is a clinically meaningful difference.  A difference of 2-3 points in the total score of the Quality of Life Index has been associated with significant improvement in overall quality of life, self-image, physical symptoms, and general health in studies assessing change in quality of life.   PHQ-9: Recent Review Flowsheet Data    Depression screen [facility] 2/9 09/23/2019   Decreased Interest 0   Down, Depressed, Hopeless 1   PHQ - 2 Score 1   Altered sleeping 2   Tired, decreased energy 3   Change in appetite 1   Feeling bad or failure about yourself  1   Trouble  concentrating 1   Moving slowly or fidgety/restless 0   Suicidal thoughts 0   PHQ-9 Score 9   Difficult doing work/chores Very difficult     Interpretation of Total Score  Total Score Depression Severity:  1-4 = Minimal depression, 5-9 = Mild depression, 10-14 = Moderate depression, 15-19 = Moderately severe depression, 20-27 = Severe depression   Psychosocial Evaluation and Intervention: Psychosocial Evaluation - 09/23/19 1139      Psychosocial Evaluation & Interventions   Interventions  Stress management education;Encouraged to exercise with the program and follow exercise prescription;Relaxation education    Continue Psychosocial Services   Follow up required by staff       Psychosocial Re-Evaluation:   Psychosocial Discharge (Final Psychosocial Re-Evaluation):    Education: Education Goals: Education classes will be provided on a weekly basis, covering required topics. Participant will state understanding/return demonstration of topics presented.  Learning Barriers/Preferences: Learning Barriers/Preferences - 09/23/19 1141      Learning Barriers/Preferences   Learning Barriers  None    Learning Preferences  Audio;Computer/Internet;Group Instruction;Individual Instruction;Pictoral;Skilled Demonstration;Verbal Instruction;Video;Written Material       Education Topics: How Lungs Work and Diseases: - Discuss the anatomy of the lungs and diseases that can affect the lungs, such as COPD.   Exercise: -Discuss the importance of exercise, FITT principles of exercise, normal and abnormal responses to exercise, and how to exercise safely.   Environmental Irritants: -Discuss types of environmental irritants and how  to limit exposure to environmental irritants.   Meds/Inhalers and oxygen: - Discuss respiratory medications, definition of an inhaler and oxygen, and the proper way to use an inhaler and oxygen.   Energy Saving Techniques: - Discuss methods to conserve  energy and decrease shortness of breath when performing activities of daily living.    Bronchial Hygiene / Breathing Techniques: - Discuss breathing mechanics, pursed-lip breathing technique,  proper posture, effective ways to clear airways, and other functional breathing techniques   Cleaning Equipment: - Provides group verbal and written instruction about the health risks of elevated stress, cause of high stress, and healthy ways to reduce stress.   Nutrition I: Fats: - Discuss the types of cholesterol, what cholesterol does to the body, and how cholesterol levels can be controlled.   Nutrition II: Labels: -Discuss the different components of food labels and how to read food labels.   Respiratory Infections: - Discuss the signs and symptoms of respiratory infections, ways to prevent respiratory infections, and the importance of seeking medical treatment when having a respiratory infection.   Stress I: Signs and Symptoms: - Discuss the causes of stress, how stress may lead to anxiety and depression, and ways to limit stress.   Stress II: Relaxation: -Discuss relaxation techniques to limit stress.   Oxygen for Home/Travel: - Discuss how to prepare for travel when on oxygen and proper ways to transport and store oxygen to ensure safety.   Knowledge Questionnaire Score: Knowledge Questionnaire Score - 09/23/19 1141      Knowledge Questionnaire Score   Pre Score  14/18       Core Components/Risk Factors/Patient Goals at Admission: Personal Goals and Risk Factors at Admission - 09/23/19 1142      Core Components/Risk Factors/Patient Goals on Admission    Weight Management  Yes    Intervention  Weight Management/Obesity: Establish reasonable short term and long term weight goals.    Admit Weight  311 lb 4.8 oz (141.2 kg)    Goal Weight: Short Term  286 lb 4.8 oz (129.9 kg)    Goal Weight: Long Term  236 lb 4.8 oz (107.2 kg)    Expected Outcomes  Short Term: Continue to  assess and modify interventions until short term weight is achieved;Long Term: Adherence to nutrition and physical activity/exercise program aimed toward attainment of established weight goal    Personal Goal Other  Yes    Personal Goal  Get lungs stronger, Lose weight, get off oxygen, be healthier overall.    Intervention  Attend class 2 x week and to supplement with at home exercise plan that was given 3 x week.    Expected Outcomes  Reach expected goals.       Core Components/Risk Factors/Patient Goals Review:    Core Components/Risk Factors/Patient Goals at Discharge (Final Review):    ITP Comments: ITP Comments    Row Name 09/23/19 1131           ITP Comments  Patient is being sent by her pulmonary doctor due to having COVID-19. she is eager to get started and get stronger. Her goals are to gain lung strength, come off oxygen, and just have better health.          Comments: Patient arrived for 1st visit/orientation/education at 0800. Patient was referred to PR by Dr. Shearon Stalls due to SOB (R06.2). During orientation advised patient on arrival and appointment times what to wear, what to do before, during and after exercise. Reviewed attendance and class policy. Talked  about inclement weather and class consultation policy. Pt is scheduled to return Pulmonary Rehab on 09/24/19 at 1445. Pt was advised to come to class 15 minutes before class starts. Patient was also given instructions on meeting with the dietician and attending the Family Structure classes. Discussed RPE/Dpysnea scales. Discussed initial THR and how to find their radial and/or carotid pulse. Discussed the initial exercise prescription and how this effects their progress. Pt is eager to get started. Patient participated in warm up stretches followed by light weights and resistance bands. Patient was able to complete 6 minute walk test. Patient did not c/o pain during or after walk test. Discussed equipment safety with patient.  Took patient pre-anthropometric measurements. Patient finished visit at 1030.

## 2019-09-24 ENCOUNTER — Encounter (HOSPITAL_COMMUNITY)
Admission: RE | Admit: 2019-09-24 | Discharge: 2019-09-24 | Disposition: A | Discharge status: Home / Self Care | Payer: Commercial Managed Care - PPO | Source: Ambulatory Visit | Attending: Internal Medicine | Admitting: Internal Medicine

## 2019-09-24 DIAGNOSIS — R0602 Shortness of breath: Secondary | ICD-10-CM | POA: Diagnosis not present

## 2019-09-24 NOTE — Progress Notes (Signed)
Daily Session Note  Patient Details  Name: Gina Conner MRN: [id number] Date of Birth: Oct 01, 1965 Referring Provider:     PULMONARY REHAB OTHER RESP ORIENTATION from 09/23/2019 in [place]  Referring Provider  Dr. Shearon Stalls      Encounter Date: 09/24/2019  Check In: Session Check In - 09/24/19 1445      Check-In   Supervising physician immediately available to respond to emergencies  See telemetry face sheet for immediately available MD    Location  [facility]    Staff Present  Aundra Dubin, RN, BSN;Other    Virtual Visit  No    Medication changes reported      No    Fall or balance concerns reported     No    Tobacco Cessation  No Change    Warm-up and Cool-down  Performed as group-led instruction    Resistance Training Performed  Yes    VAD Patient?  No    PAD/SET Patient?  No      Pain Assessment   Currently in Pain?  No/denies    Pain Score  0-No pain    Multiple Pain Sites  No       Capillary Blood Glucose: No results found for this or any previous visit (from the past 24 hour(s)).    Social History   Tobacco Use  Smoking Status Never Smoker  Smokeless Tobacco Never Used    Goals Met:  Proper associated with RPD/PD & O2 Sat Improved SOB with ADL's Using PLB without cueing & demonstrates good technique Exercise tolerated well No report of cardiac concerns or symptoms Strength training completed today  Goals Unmet:  Not Applicable  Comments: Check out 1545.   Dr. Kate Sable is Medical Director for [facility] Cardiac and Pulmonary Rehab.

## 2019-09-29 ENCOUNTER — Other Ambulatory Visit: Payer: Self-pay

## 2019-09-29 ENCOUNTER — Encounter (HOSPITAL_COMMUNITY)
Admission: RE | Admit: 2019-09-29 | Discharge: 2019-09-29 | Disposition: A | Discharge status: Home / Self Care | Payer: Commercial Managed Care - PPO | Source: Ambulatory Visit | Attending: Internal Medicine | Admitting: Internal Medicine

## 2019-09-29 DIAGNOSIS — R0602 Shortness of breath: Secondary | ICD-10-CM

## 2019-09-29 NOTE — Progress Notes (Signed)
Daily Session Note  Patient Details  Name: Gina Conner MRN: [id number] Date of Birth: May 29, 1965 Referring Provider:     PULMONARY REHAB OTHER RESP ORIENTATION from 09/23/2019 in [place]  Referring Provider  Dr. Shearon Stalls      Encounter Date: 09/29/2019  Check In: Session Check In - 09/29/19 1514      Check-In   Supervising physician immediately available to respond to emergencies  See telemetry face sheet for immediately available MD    Location  [facility]    Staff Present  Russella Dar, MS, EP, [facility], Exercise Physiologist;Other    Virtual Visit  No    Medication changes reported      No    Fall or balance concerns reported     No    Tobacco Cessation  No Change    Warm-up and Cool-down  Performed as group-led instruction    Resistance Training Performed  Yes    VAD Patient?  No    PAD/SET Patient?  No      Pain Assessment   Currently in Pain?  No/denies    Pain Score  0-No pain    Multiple Pain Sites  No       Capillary Blood Glucose: No results found for this or any previous visit (from the past 24 hour(s)).    Social History   Tobacco Use  Smoking Status Never Smoker  Smokeless Tobacco Never Used    Goals Met:  Proper associated with RPD/PD & O2 Sat Independence with exercise equipment Improved SOB with ADL's Using PLB without cueing & demonstrates good technique Exercise tolerated well Personal goals reviewed No report of cardiac concerns or symptoms Strength training completed today  Goals Unmet:  Not Applicable  Comments: Check out: 350   Dr. Kate Sable is Medical Director for [place] and Pulmonary Rehab.

## 2019-10-01 ENCOUNTER — Encounter (HOSPITAL_COMMUNITY)
Admission: RE | Admit: 2019-10-01 | Discharge: 2019-10-01 | Disposition: A | Discharge status: Home / Self Care | Payer: Commercial Managed Care - PPO | Source: Ambulatory Visit | Attending: Internal Medicine | Admitting: Internal Medicine

## 2019-10-01 ENCOUNTER — Other Ambulatory Visit: Payer: Self-pay

## 2019-10-01 DIAGNOSIS — R0602 Shortness of breath: Secondary | ICD-10-CM

## 2019-10-01 NOTE — Progress Notes (Signed)
Daily Session Note  Patient Details  Name: Gina Conner MRN: [id number] Date of Birth: [birth year] Referring Provider:     PULMONARY REHAB OTHER RESP ORIENTATION from 09/23/2019 in [place]  Referring Provider  Dr. Shearon Stalls      Encounter Date: 10/01/2019  Check In: Session Check In - 10/01/19 1442      Check-In   Supervising physician immediately available to respond to emergencies  See telemetry face sheet for immediately available MD    Location  [facility]    Staff Present  Russella Dar, MS, EP, [facility], Exercise Physiologist;Other;Beverlee Wilmarth Wynetta Emery, RN, BSN    Virtual Visit  No    Medication changes reported      No    Fall or balance concerns reported     No    Tobacco Cessation  No Change    Current number of cigarettes/nicotine per day      0    Warm-up and Cool-down  Performed as group-led instruction    Resistance Training Performed  Yes    VAD Patient?  No    PAD/SET Patient?  No      Pain Assessment   Currently in Pain?  No/denies    Pain Score  0-No pain    Multiple Pain Sites  No       Capillary Blood Glucose: No results found for this or any previous visit (from the past 24 hour(s)).    Social History   Tobacco Use  Smoking Status Never Smoker  Smokeless Tobacco Never Used    Goals Met:  Proper associated with RPD/PD & O2 Sat Independence with exercise equipment Improved SOB with ADL's Using PLB without cueing & demonstrates good technique Exercise tolerated well No report of cardiac concerns or symptoms Strength training completed today  Goals Unmet:  Not Applicable  Comments: Check out 1545.   Dr. Kate Sable is Medical Director for [facility] Cardiac and Pulmonary Rehab.

## 2019-10-06 ENCOUNTER — Encounter (HOSPITAL_COMMUNITY)
Admission: RE | Admit: 2019-10-06 | Discharge: 2019-10-06 | Disposition: A | Discharge status: Home / Self Care | Payer: Commercial Managed Care - PPO | Source: Ambulatory Visit | Attending: Internal Medicine | Admitting: Internal Medicine

## 2019-10-06 ENCOUNTER — Other Ambulatory Visit: Payer: Self-pay

## 2019-10-06 DIAGNOSIS — R0602 Shortness of breath: Secondary | ICD-10-CM

## 2019-10-06 NOTE — Progress Notes (Signed)
Daily Session Note  Patient Details  Name: Gina Conner MRN: [id number] Date of Birth: 04/07/1966 Referring Provider:     PULMONARY REHAB OTHER RESP ORIENTATION from 09/23/2019 in [place]  Referring Provider  Dr. Shearon Stalls      Encounter Date: 10/06/2019  Check In: Session Check In - 10/06/19 0245      Check-In   Supervising physician immediately available to respond to emergencies  See telemetry face sheet for immediately available MD    Location  [facility]    Staff Present  Russella Dar, MS, EP, [facility], Exercise Physiologist;Alexias Margerum, Exercise Physiologist;Other    Virtual Visit  No    Medication changes reported      No    Fall or balance concerns reported     No    Tobacco Cessation  No Change    Warm-up and Cool-down  Performed as group-led instruction    Resistance Training Performed  Yes    VAD Patient?  No    PAD/SET Patient?  No      Pain Assessment   Currently in Pain?  No/denies    Pain Score  0-No pain    Multiple Pain Sites  No       Capillary Blood Glucose: No results found for this or any previous visit (from the past 24 hour(s)).    Social History   Tobacco Use  Smoking Status Never Smoker  Smokeless Tobacco Never Used    Goals Met:  Proper associated with RPD/PD & O2 Sat Independence with exercise equipment Improved SOB with ADL's Using PLB without cueing & demonstrates good technique Exercise tolerated well No report of cardiac concerns or symptoms Strength training completed today  Goals Unmet:  Not Applicable  Comments: Check out: 71   Dr. Kathie Dike is Medical Director for [facility] Pulmonary Rehab.

## 2019-10-08 ENCOUNTER — Other Ambulatory Visit: Payer: Self-pay

## 2019-10-08 ENCOUNTER — Encounter (HOSPITAL_COMMUNITY)
Admission: RE | Admit: 2019-10-08 | Discharge: 2019-10-08 | Disposition: A | Discharge status: Home / Self Care | Payer: Commercial Managed Care - PPO | Source: Ambulatory Visit | Attending: Internal Medicine | Admitting: Internal Medicine

## 2019-10-08 DIAGNOSIS — R0602 Shortness of breath: Secondary | ICD-10-CM | POA: Diagnosis not present

## 2019-10-08 NOTE — Progress Notes (Signed)
Daily Session Note  Patient Details  Name: Gina Conner MRN: [id number] Date of Birth: 03-29-1966 Referring Provider:     PULMONARY REHAB OTHER RESP ORIENTATION from 09/23/2019 in [place]  Referring Provider  Dr. Shearon Stalls      Encounter Date: 10/08/2019  Check In: Session Check In - 10/08/19 1439      Check-In   Supervising physician immediately available to respond to emergencies  See telemetry face sheet for immediately available MD    Location  [facility]    Staff Present  Russella Dar, MS, EP, [facility], Exercise Physiologist;Vaniece Hatchett, Exercise Physiologist;Other;Deaira Leckey Wynetta Emery, RN, BSN    Virtual Visit  No    Medication changes reported      No    Fall or balance concerns reported     No    Tobacco Cessation  No Change    Current number of cigarettes/nicotine per day      0    Warm-up and Cool-down  Performed as group-led instruction    Resistance Training Performed  Yes    VAD Patient?  No    PAD/SET Patient?  No      Pain Assessment   Currently in Pain?  No/denies    Pain Score  0-No pain    Multiple Pain Sites  No       Capillary Blood Glucose: No results found for this or any previous visit (from the past 24 hour(s)).    Social History   Tobacco Use  Smoking Status Never Smoker  Smokeless Tobacco Never Used    Goals Met:  Proper associated with RPD/PD & O2 Sat Independence with exercise equipment Improved SOB with ADL's Using PLB without cueing & demonstrates good technique Exercise tolerated well No report of cardiac concerns or symptoms Strength training completed today  Goals Unmet:  Not Applicable  Comments: Check out 1530.   Dr. Kathie Dike is Medical Director for [facility] Pulmonary Rehab.

## 2019-10-13 ENCOUNTER — Other Ambulatory Visit: Payer: Self-pay

## 2019-10-13 ENCOUNTER — Encounter (HOSPITAL_COMMUNITY)
Admission: RE | Admit: 2019-10-13 | Discharge: 2019-10-13 | Disposition: A | Discharge status: Home / Self Care | Payer: Commercial Managed Care - PPO | Source: Ambulatory Visit | Attending: Internal Medicine | Admitting: Internal Medicine

## 2019-10-13 DIAGNOSIS — R0602 Shortness of breath: Secondary | ICD-10-CM | POA: Diagnosis not present

## 2019-10-13 NOTE — Progress Notes (Signed)
Daily Session Note  Patient Details  Name: JOSELY MOFFAT MRN: [id number] Date of Birth: 05-31-1965 Referring Provider:     PULMONARY REHAB OTHER RESP ORIENTATION from 09/23/2019 in [place]  Referring Provider  Dr. Shearon Stalls      Encounter Date: 10/13/2019  Check In: Session Check In - 10/13/19 1534      Check-In   Supervising physician immediately available to respond to emergencies  See telemetry face sheet for immediately available MD    Location  [facility]    Staff Present  Russella Dar, MS, EP, [facility], Exercise Physiologist;Vaniece Hatchett, Exercise Physiologist;Other    Virtual Visit  No    Medication changes reported      No    Fall or balance concerns reported     No    Tobacco Cessation  No Change    Warm-up and Cool-down  Performed as group-led instruction    Resistance Training Performed  Yes    VAD Patient?  No      Pain Assessment   Currently in Pain?  No/denies    Pain Score  0-No pain    Multiple Pain Sites  No       Capillary Blood Glucose: No results found for this or any previous visit (from the past 24 hour(s)).    Social History   Tobacco Use  Smoking Status Never Smoker  Smokeless Tobacco Never Used    Goals Met:  Proper associated with RPD/PD & O2 Sat Independence with exercise equipment Improved SOB with ADL's Using PLB without cueing & demonstrates good technique Exercise tolerated well Personal goals reviewed No report of cardiac concerns or symptoms Strength training completed today  Goals Unmet:  Not Applicable  Comments: Check out: 1550   Dr. Kathie Dike is Medical Director for [facility] Pulmonary Rehab.

## 2019-10-13 NOTE — Progress Notes (Signed)
Daily Session Note  Patient Details  Name: Gina Conner MRN: [id number] Date of Birth: [birth year] Referring Provider:     PULMONARY REHAB OTHER RESP ORIENTATION from 09/23/2019 in [place]  Referring Provider  Dr. Shearon Stalls      Encounter Date: 10/13/2019  Check In: Session Check In - 10/13/19 1534      Check-In   Supervising physician immediately available to respond to emergencies  See telemetry face sheet for immediately available MD    Location  [facility]    Staff Present  Russella Dar, MS, EP, [facility], Exercise Physiologist;Vaniece Hatchett, Exercise Physiologist;Other    Virtual Visit  No    Medication changes reported      No    Fall or balance concerns reported     No    Tobacco Cessation  No Change    Warm-up and Cool-down  Performed as group-led instruction    Resistance Training Performed  Yes    VAD Patient?  No      Pain Assessment   Currently in Pain?  No/denies    Pain Score  0-No pain    Multiple Pain Sites  No       Capillary Blood Glucose: No results found for this or any previous visit (from the past 24 hour(s)).    Social History   Tobacco Use  Smoking Status Never Smoker  Smokeless Tobacco Never Used    Goals Met:  Proper associated with RPD/PD & O2 Sat Independence with exercise equipment Improved SOB with ADL's Using PLB without cueing & demonstrates good technique Exercise tolerated well Personal goals reviewed No report of cardiac concerns or symptoms Strength training completed today  Goals Unmet:  Not Applicable  Comments: Check out: 1450   Dr. Kathie Dike is Medical Director for [facility] Pulmonary Rehab.

## 2019-10-15 ENCOUNTER — Other Ambulatory Visit: Payer: Self-pay

## 2019-10-15 ENCOUNTER — Encounter (HOSPITAL_COMMUNITY)
Admission: RE | Admit: 2019-10-15 | Discharge: 2019-10-15 | Disposition: A | Discharge status: Home / Self Care | Payer: Commercial Managed Care - PPO | Source: Ambulatory Visit | Attending: Internal Medicine | Admitting: Internal Medicine

## 2019-10-15 DIAGNOSIS — R0602 Shortness of breath: Secondary | ICD-10-CM

## 2019-10-15 NOTE — Progress Notes (Signed)
Daily Session Note  Patient Details  Name: Gina Conner MRN: [id number] Date of Birth: [birth year] Referring Provider:     PULMONARY REHAB OTHER RESP ORIENTATION from 09/23/2019 in [place]  Referring Provider  Dr. Shearon Stalls      Encounter Date: 10/15/2019  Check In: Session Check In - 10/15/19 1445      Check-In   Supervising physician immediately available to respond to emergencies  See telemetry face sheet for immediately available MD    Location  [facility]    Staff Present  Russella Dar, MS, EP, [facility], Exercise Physiologist;Other;Vaniece Hatchett, Exercise Physiologist    Virtual Visit  No    Medication changes reported      No    Fall or balance concerns reported     No    Tobacco Cessation  No Change    Resistance Training Performed  Yes    VAD Patient?  No    PAD/SET Patient?  No      Pain Assessment   Currently in Pain?  No/denies    Pain Score  0-No pain    Multiple Pain Sites  No       Capillary Blood Glucose: No results found for this or any previous visit (from the past 24 hour(s)).    Social History   Tobacco Use  Smoking Status Never Smoker  Smokeless Tobacco Never Used    Goals Met:  Proper associated with RPD/PD & O2 Sat Independence with exercise equipment Improved SOB with ADL's Using PLB without cueing & demonstrates good technique Exercise tolerated well Personal goals reviewed No report of cardiac concerns or symptoms Strength training completed today  Goals Unmet:  Not Applicable  Comments: check out 15:50   Dr. Kathie Dike is Medical Director for [facility] Pulmonary Rehab.

## 2019-10-16 ENCOUNTER — Other Ambulatory Visit: Payer: Self-pay

## 2019-10-16 ENCOUNTER — Other Ambulatory Visit (HOSPITAL_COMMUNITY)
Admission: RE | Admit: 2019-10-16 | Discharge: 2019-10-16 | Disposition: A | Discharge status: Home / Self Care | Payer: Commercial Managed Care - PPO | Source: Ambulatory Visit | Attending: Internal Medicine | Admitting: Internal Medicine

## 2019-10-16 DIAGNOSIS — Z20822 Contact with and (suspected) exposure to covid-19: Secondary | ICD-10-CM | POA: Insufficient documentation

## 2019-10-16 DIAGNOSIS — Z01812 Encounter for preprocedural laboratory examination: Secondary | ICD-10-CM | POA: Insufficient documentation

## 2019-10-16 LAB — SARS CORONAVIRUS 2 (TAT 6-24 HRS): SARS Coronavirus 2: NEGATIVE

## 2019-10-20 ENCOUNTER — Encounter (HOSPITAL_COMMUNITY): Payer: Commercial Managed Care - PPO

## 2019-10-20 NOTE — Progress Notes (Signed)
Pulmonary Individual Treatment Plan  Patient Details  Name: Gina Conner MRN: [id number] Date of Birth: 10/21/1965 Referring Provider:     PULMONARY REHAB OTHER RESP ORIENTATION from 09/23/2019 in [place]  Referring Provider  Dr. Shearon Stalls      Initial Encounter Date:    [place] from 09/23/2019 in [place]  Date  09/23/19      Visit Diagnosis: SOB (shortness of breath)  Patient's Home Medications on Admission:   Current Outpatient Medications:  .  acetaminophen (TYLENOL) 500 MG tablet, Take 1,000 mg by mouth every 6 (six) hours as needed for mild pain or headache. , Disp: , Rfl:  .  albuterol (VENTOLIN HFA) 108 (90 Base) MCG/ACT inhaler, Inhale 2 puffs into the lungs every 6 (six) hours as needed for wheezing or shortness of breath., Disp: 6.7 g, Rfl: 0 .  ALPRAZolam (XANAX) 0.5 MG tablet, Take 0.5 mg by mouth at bedtime as needed for sleep., Disp: , Rfl:  .  Cholecalciferol (D 5000) 5000 units capsule, Take 5,000 Units by mouth daily., Disp: , Rfl:  .  ipratropium-albuterol (DUONEB) 0.5-2.5 (3) MG/3ML SOLN, Take 3 mLs by nebulization every 6 (six) hours as needed (shortness of breath)., Disp: 360 mL, Rfl: 3 .  levothyroxine (SYNTHROID) 88 MCG tablet, Take 88 mcg by mouth daily before breakfast. Pt unsure of strength. , Disp: , Rfl:  .  NP THYROID 60 MG tablet, Take 60 mg by mouth daily before breakfast., Disp: , Rfl:  .  spironolactone (ALDACTONE) 25 MG tablet, Take 25 mg by mouth at bedtime., Disp: , Rfl:   Past Medical History: Past Medical History:  Diagnosis Date  . DVT (deep venous thrombosis) ([place])    provoked during tendon surgery was treated 3 months of coumadin  . Hypothyroidism     Tobacco Use: Social History   Tobacco Use  Smoking Status Never Smoker  Smokeless Tobacco Never Used    Labs: Recent Review Flowsheet Data    Labs for ITP Cardiac and Pulmonary Rehab Latest Ref Rng &  Units 05/18/2019 05/22/2019 05/23/2019   Cholestrol 0 - 200 mg/dL - - 121   LDLCALC 0 - 99 mg/dL - - 68   HDL >40 mg/dL - - 31(L)   Trlycerides <150 mg/dL 126 - 108   Hemoglobin A1c 4.8 - 5.6 % - 6.2(H) -      Capillary Blood Glucose: No results found for: GLUCAP   Pulmonary Assessment Scores: Pulmonary Assessment Scores    Row Name 09/23/19 1137         ADL UCSD   ADL Phase  Entry     SOB Score total  53     Rest  0     Walk  10     Stairs  4     Bath  2     Dress  2     Shop  3       CAT Score   CAT Score  20       mMRC Score   mMRC Score  3       UCSD: Self-administered rating of dyspnea associated with activities of daily living (ADLs) 6-point scale (0 = "not at all" to 5 = "maximal or unable to do because of breathlessness")  Scoring Scores range from 0 to 120.  Minimally important difference is 5 units  CAT: CAT can identify the health impairment of COPD patients and is better correlated with  disease progression.  CAT has a scoring range of zero to 40. The CAT score is classified into four groups of low (less than 10), medium (10 - 20), high (21-30) and very high (31-40) based on the impact level of disease on health status. A CAT score over 10 suggests significant symptoms.  A worsening CAT score could be explained by an exacerbation, poor medication adherence, poor inhaler technique, or progression of COPD or comorbid conditions.  CAT MCID is 2 points  mMRC: mMRC (Modified Medical Research Council) Dyspnea Scale is used to assess the degree of baseline functional disability in patients of respiratory disease due to dyspnea. No minimal important difference is established. A decrease in score of 1 point or greater is considered a positive change.   Pulmonary Function Assessment:   Exercise Target Goals: Exercise Program Goal: Individual exercise prescription set using results from initial 6 min walk test and THRR while considering  patient's activity barriers  and safety.   Exercise Prescription Goal: Initial exercise prescription builds to 30-45 minutes a day of aerobic activity, 2-3 days per week.  Home exercise guidelines will be given to patient during program as part of exercise prescription that the participant will acknowledge.  Activity Barriers & Risk Stratification: Activity Barriers & Cardiac Risk Stratification - 09/23/19 1142      Activity Barriers & Cardiac Risk Stratification   Activity Barriers  Deconditioning;Shortness of Breath    Cardiac Risk Stratification  Low       6 Minute Walk: 6 Minute Walk    Row Name 09/23/19 1139         6 Minute Walk   Phase  Initial     Distance  1200 feet     Walk Time  6 minutes     # of Rest Breaks  0     MPH  2.27     METS  2.74     RPE  13     Perceived Dyspnea   13     VO2 Peak  9.44     Symptoms  No     Resting HR  76 bpm     Resting BP  110/80     Resting Oxygen Saturation   97 %     Exercise Oxygen Saturation  during 6 min walk  94 %     Max Ex. HR  110 bpm     Max Ex. BP  146/106     2 Minute Post BP  110/98        Oxygen Initial Assessment: Oxygen Initial Assessment - 09/23/19 1135      Home Oxygen   Home Oxygen Device  E-Tanks    Sleep Oxygen Prescription  None    Home Exercise Oxygen Prescription  Continuous    Liters per minute  1    Home at Rest Exercise Oxygen Prescription  None    Compliance with Home Oxygen Use  Yes      Initial 6 min Walk   Oxygen Used  None      Program Oxygen Prescription   Program Oxygen Prescription  Continuous   As needed   Liters per minute  2      Intervention   Short Term Goals  To learn and exhibit compliance with exercise, home and travel O2 prescription;To learn and understand importance of maintaining oxygen saturations>88%;To learn and demonstrate proper pursed lip breathing techniques or other breathing techniques.    Long  Term Goals  Exhibits compliance  with exercise, home and travel O2 prescription;Maintenance  of O2 saturations>88%       Oxygen Re-Evaluation: Oxygen Re-Evaluation    Row Name 10/20/19 1241             Program Oxygen Prescription   Program Oxygen Prescription  None         Home Oxygen   Home Oxygen Device  None       Sleep Oxygen Prescription  None       Home Exercise Oxygen Prescription  None       Home at Rest Exercise Oxygen Prescription  None       Compliance with Home Oxygen Use  Yes         Goals/Expected Outcomes   Short Term Goals  To learn and exhibit compliance with exercise, home and travel O2 prescription;To learn and understand importance of maintaining oxygen saturations>88%;To learn and demonstrate proper pursed lip breathing techniques or other breathing techniques.       Long  Term Goals  Exhibits compliance with exercise, home and travel O2 prescription;Maintenance of O2 saturations>88%       Comments  Patient is meeting her expected goals and outcomes both long and short term. Will continue to monitor for progress.       Goals/Expected Outcomes  Patient will continue to meet her expected goals and outcomes.          Oxygen Discharge (Final Oxygen Re-Evaluation): Oxygen Re-Evaluation - 10/20/19 1241      Program Oxygen Prescription   Program Oxygen Prescription  None      Home Oxygen   Home Oxygen Device  None    Sleep Oxygen Prescription  None    Home Exercise Oxygen Prescription  None    Home at Rest Exercise Oxygen Prescription  None    Compliance with Home Oxygen Use  Yes      Goals/Expected Outcomes   Short Term Goals  To learn and exhibit compliance with exercise, home and travel O2 prescription;To learn and understand importance of maintaining oxygen saturations>88%;To learn and demonstrate proper pursed lip breathing techniques or other breathing techniques.    Long  Term Goals  Exhibits compliance with exercise, home and travel O2 prescription;Maintenance of O2 saturations>88%    Comments  Patient is meeting her expected goals and  outcomes both long and short term. Will continue to monitor for progress.    Goals/Expected Outcomes  Patient will continue to meet her expected goals and outcomes.       Initial Exercise Prescription: Initial Exercise Prescription - 09/23/19 1100      Date of Initial Exercise RX and Referring Provider   Date  09/23/19    Referring Provider  Dr. Shearon Stalls    Expected Discharge Date  12/24/19      Treadmill   MPH  1.2    Grade  0    Minutes  17    METs  1.91      T5 Nustep   Level  1    SPM  73    Minutes  22    METs  1.7      Prescription Details   Frequency (times per week)  2    Duration  Progress to 30 minutes of continuous aerobic without signs/symptoms of physical distress      Intensity   Ratings of Perceived Exertion  11-13    Perceived Dyspnea  0-4      Progression   Progression  Continue progressive overload  as per policy without signs/symptoms or physical distress.      Resistance Training   Training Prescription  Yes    Weight  1    Reps  10-15       Perform Capillary Blood Glucose checks as needed.  Exercise Prescription Changes:  Exercise Prescription Changes    Row Name 10/09/19 1500             Response to Exercise   Blood Pressure (Admit)  122/88       Blood Pressure (Exercise)  142/86       Blood Pressure (Exit)  122/84       Heart Rate (Admit)  76 bpm       Heart Rate (Exercise)  97 bpm       Heart Rate (Exit)  81 bpm       Oxygen Saturation (Admit)  97 %       Oxygen Saturation (Exercise)  93 %       Oxygen Saturation (Exit)  96 %       Rating of Perceived Exertion (Exercise)  11       Perceived Dyspnea (Exercise)  11       Symptoms  none       Duration  Continue with 30 min of aerobic exercise without signs/symptoms of physical distress.       Intensity  THRR unchanged         Progression   Progression  Continue to progress workloads to maintain intensity without signs/symptoms of physical distress.       Average METs  2.15          Resistance Training   Training Prescription  Yes       Weight  2       Reps  10-15       Time  10 Minutes         Oxygen   Liters  0         Treadmill   MPH  1.3       Grade  0       Minutes  17       METs  1.99         T5 Nustep   Level  1       SPM  105       Minutes  22       METs  2.4         Home Exercise Plan   Plans to continue exercise at  Home (comment)       Frequency  Add 3 additional days to program exercise sessions.       Initial Home Exercises Provided  09/23/19          Exercise Comments:  Exercise Comments    Row Name 09/23/19 1153 10/09/19 1525         Exercise Comments  Patient preformed well just got of little short of breath. Patient is looking foward to starting the program.  Patient has been here 6 sessions so far. Patient is preforming well and very motivated to reach her goals. We will continue to progress as tolerated.         Exercise Goals and Review:  Exercise Goals    Row Name 09/23/19 1151             Exercise Goals   Increase Physical Activity  Yes       Intervention  Provide advice, education, support  and counseling about physical activity/exercise needs.;Develop an individualized exercise prescription for aerobic and resistive training based on initial evaluation findings, risk stratification, comorbidities and participant's personal goals.       Expected Outcomes  Short Term: Attend rehab on a regular basis to increase amount of physical activity.;Long Term: Add in home exercise to make exercise part of routine and to increase amount of physical activity.       Increase Strength and Stamina  Yes       Intervention  Provide advice, education, support and counseling about physical activity/exercise needs.;Develop an individualized exercise prescription for aerobic and resistive training based on initial evaluation findings, risk stratification, comorbidities and participant's personal goals.       Expected Outcomes  Long  Term: Improve cardiorespiratory fitness, muscular endurance and strength as measured by increased METs and functional capacity (6MWT);Short Term: Perform resistance training exercises routinely during rehab and add in resistance training at home       Able to understand and use rate of perceived exertion (RPE) scale  Yes       Intervention  Provide education and explanation on how to use RPE scale       Expected Outcomes  Short Term: Able to use RPE daily in rehab to express subjective intensity level;Long Term:  Able to use RPE to guide intensity level when exercising independently       Able to understand and use Dyspnea scale  Yes       Intervention  Provide education and explanation on how to use Dyspnea scale       Expected Outcomes  Short Term: Able to use Dyspnea scale daily in rehab to express subjective sense of shortness of breath during exertion;Long Term: Able to use Dyspnea scale to guide intensity level when exercising independently       Knowledge and understanding of Target Heart Rate Range (THRR)  Yes       Intervention  Provide education and explanation of THRR including how the numbers were predicted and where they are located for reference       Expected Outcomes  Long Term: Able to use THRR to govern intensity when exercising independently;Short Term: Able to state/look up THRR       Able to check pulse independently  Yes       Intervention  Provide education and demonstration on how to check pulse in carotid and radial arteries.;Review the importance of being able to check your own pulse for safety during independent exercise       Expected Outcomes  Short Term: Able to explain why pulse checking is important during independent exercise;Long Term: Able to check pulse independently and accurately       Understanding of Exercise Prescription  Yes       Intervention  Provide education, explanation, and written materials on patient's individual exercise prescription       Expected  Outcomes  Short Term: Able to explain program exercise prescription;Long Term: Able to explain home exercise prescription to exercise independently          Exercise Goals Re-Evaluation : Exercise Goals Re-Evaluation    [place] Name 10/12/19 1252             Exercise Goal Re-Evaluation   Exercise Goals Review  Increase Physical Activity;Increase Strength and Stamina       Comments  Patients goals are to get lungs stronger, be healthier and to lose 50lbs. Patient feels that she is getting stronger with each  visit. She says she is already able to do more at home and while shopping. Patient is not losing any weight. She has gained 2lbs. We will talk to her about her weight lose goals and to discuss her diet. We will continue to monitor her progress.       Expected Outcomes  To reach her expected goals.          Discharge Exercise Prescription (Final Exercise Prescription Changes): Exercise Prescription Changes - 10/09/19 1500      Response to Exercise   Blood Pressure (Admit)  122/88    Blood Pressure (Exercise)  142/86    Blood Pressure (Exit)  122/84    Heart Rate (Admit)  76 bpm    Heart Rate (Exercise)  97 bpm    Heart Rate (Exit)  81 bpm    Oxygen Saturation (Admit)  97 %    Oxygen Saturation (Exercise)  93 %    Oxygen Saturation (Exit)  96 %    Rating of Perceived Exertion (Exercise)  11    Perceived Dyspnea (Exercise)  11    Symptoms  none    Duration  Continue with 30 min of aerobic exercise without signs/symptoms of physical distress.    Intensity  THRR unchanged      Progression   Progression  Continue to progress workloads to maintain intensity without signs/symptoms of physical distress.    Average METs  2.15      Resistance Training   Training Prescription  Yes    Weight  2    Reps  10-15    Time  10 Minutes      Oxygen   Liters  0      Treadmill   MPH  1.3    Grade  0    Minutes  17    METs  1.99      T5 Nustep   Level  1    SPM  105    Minutes  22     METs  2.4      Home Exercise Plan   Plans to continue exercise at  Home (comment)    Frequency  Add 3 additional days to program exercise sessions.    Initial Home Exercises Provided  09/23/19       Nutrition:  Target Goals: Understanding of nutrition guidelines, daily intake of sodium 1500mg , cholesterol 200mg , calories 30% from fat and 7% or less from saturated fats, daily to have 5 or more servings of fruits and vegetables.  Biometrics: Pre Biometrics - 09/23/19 1156      Pre Biometrics   Height  5\' 7"  (1.702 m)    Weight  (!) 141.2 kg    Waist Circumference  55 inches    Hip Circumference  60 inches    Waist to Hip Ratio  0.92 %    BMI (Calculated)  48.75    Triceps Skinfold  15 mm    % Body Fat  52.3 %    Grip Strength  25.53 kg    Flexibility  14.33 in    Single Leg Stand  9.29 seconds        Nutrition Therapy Plan and Nutrition Goals: Nutrition Therapy & Goals - 10/20/19 1243      Personal Nutrition Goals   Comments  We continue to work with out RD to schedule classes with new patients. In the interim, we are providing handouts for education. Patient's medficts diet assessment score was 35. She says she trys  to eat healthy overall. Will continue to monitor.      Intervention Plan   Intervention  Nutrition handout(s) given to patient.       Nutrition Assessments: Nutrition Assessments - 09/23/19 1141      MEDFICTS Scores   Pre Score  35       Nutrition Goals Re-Evaluation:   Nutrition Goals Discharge (Final Nutrition Goals Re-Evaluation):   Psychosocial: Target Goals: Acknowledge presence or absence of significant depression and/or stress, maximize coping skills, provide positive support system. Participant is able to verbalize types and ability to use techniques and skills needed for reducing stress and depression.  Initial Review & Psychosocial Screening: Initial Psych Review & Screening - 09/23/19 1138      Initial Review   Current issues  with  Current Depression   All due to having COVID-19 and not being able to do ADL's     [place]?  Yes      Barriers   Psychosocial barriers to participate in program  The patient should benefit from training in stress management and relaxation.      Screening Interventions   Interventions  Encouraged to exercise    Expected Outcomes  Short Term goal: Identification and review with participant of any Quality of Life or Depression concerns found by scoring the questionnaire.;Long Term goal: The participant improves quality of Life and PHQ9 Scores as seen by post scores and/or verbalization of changes       Quality of Life Scores: Quality of Life - 09/23/19 1200      Quality of Life   Select  Quality of Life      Quality of Life Scores   Health/Function Pre  13.31 %    Socioeconomic Pre  25.5 %    Psych/Spiritual Pre  15.43 %    Family Pre  30 %    GLOBAL Pre  18.14 %      Scores of 19 and below usually indicate a poorer quality of life in these areas.  A difference of  2-3 points is a clinically meaningful difference.  A difference of 2-3 points in the total score of the Quality of Life Index has been associated with significant improvement in overall quality of life, self-image, physical symptoms, and general health in studies assessing change in quality of life.   PHQ-9: Recent Review Flowsheet Data    Depression screen [facility] 2/9 09/23/2019   Decreased Interest 0   Down, Depressed, Hopeless 1   PHQ - 2 Score 1   Altered sleeping 2   Tired, decreased energy 3   Change in appetite 1   Feeling bad or failure about yourself  1   Trouble concentrating 1   Moving slowly or fidgety/restless 0   Suicidal thoughts 0   PHQ-9 Score 9   Difficult doing work/chores Very difficult     Interpretation of Total Score  Total Score Depression Severity:  1-4 = Minimal depression, 5-9 = Mild depression, 10-14 = Moderate depression, 15-19 = Moderately severe  depression, 20-27 = Severe depression   Psychosocial Evaluation and Intervention: Psychosocial Evaluation - 09/23/19 1139      Psychosocial Evaluation & Interventions   Interventions  Stress management education;Encouraged to exercise with the program and follow exercise prescription;Relaxation education    Continue Psychosocial Services   Follow up required by staff       Psychosocial Re-Evaluation: Psychosocial Re-Evaluation    [place] Name 10/20/19 1248  Psychosocial Re-Evaluation   Comments  Patient's initial QOL score was 18.14 and her PHQ-9 score was 9. She has no diagnosis of depression. She says she feels depressed at times due to her inability to do her ADL's without SOB all due to [place]. She feels she is able to manage these feelings through her family support. She says as she begins to feel better physically, her emotional state improves. She does take Alprazolam 0.5 mg prn for sleep. Will continue to monitor.       Expected Outcomes  Patient will have improved QOL and PHQ-9 scores at discharge with no psychosocial issues identified.       Interventions  Encouraged to attend Pulmonary Rehabilitation for the exercise;Relaxation education;Stress management education       Continue Psychosocial Services   Follow up required by staff          Psychosocial Discharge (Final Psychosocial Re-Evaluation): Psychosocial Re-Evaluation - 10/20/19 1248      Psychosocial Re-Evaluation   Comments  Patient's initial QOL score was 18.14 and her PHQ-9 score was 9. She has no diagnosis of depression. She says she feels depressed at times due to her inability to do her ADL's without SOB all due to [place]. She feels she is able to manage these feelings through her family support. She says as she begins to feel better physically, her emotional state improves. She does take Alprazolam 0.5 mg prn for sleep. Will continue to monitor.    Expected Outcomes  Patient will have  improved QOL and PHQ-9 scores at discharge with no psychosocial issues identified.    Interventions  Encouraged to attend Pulmonary Rehabilitation for the exercise;Relaxation education;Stress management education    Continue Psychosocial Services   Follow up required by staff        Education: Education Goals: Education classes will be provided on a weekly basis, covering required topics. Participant will state understanding/return demonstration of topics presented.  Learning Barriers/Preferences: Learning Barriers/Preferences - 09/23/19 1141      Learning Barriers/Preferences   Learning Barriers  None    Learning Preferences  Audio;Computer/Internet;Group Instruction;Individual Instruction;Pictoral;Skilled Demonstration;Verbal Instruction;Video;Written Material       Education Topics: How Lungs Work and Diseases: - Discuss the anatomy of the lungs and diseases that can affect the lungs, such as COPD.   Exercise: -Discuss the importance of exercise, FITT principles of exercise, normal and abnormal responses to exercise, and how to exercise safely.   Environmental Irritants: -Discuss types of environmental irritants and how to limit exposure to environmental irritants.   Meds/Inhalers and oxygen: - Discuss respiratory medications, definition of an inhaler and oxygen, and the proper way to use an inhaler and oxygen.   Energy Saving Techniques: - Discuss methods to conserve energy and decrease shortness of breath when performing activities of daily living.    Bronchial Hygiene / Breathing Techniques: - Discuss breathing mechanics, pursed-lip breathing technique,  proper posture, effective ways to clear airways, and other functional breathing techniques   Cleaning Equipment: - Provides group verbal and written instruction about the health risks of elevated stress, cause of high stress, and healthy ways to reduce stress.   PULMONARY REHAB OTHER RESPIRATORY from 10/08/2019 in  [place]  Date  09/24/19  Educator  Etheleen Mayhew  Instruction Review Code  1- Verbalizes Understanding      Nutrition I: Fats: - Discuss the types of cholesterol, what cholesterol does to the body, and how cholesterol levels can be controlled.  PULMONARY REHAB OTHER RESPIRATORY from 10/08/2019 in [place]  Date  10/01/19  Educator  Etheleen Mayhew  Instruction Review Code  1- Verbalizes Understanding      Nutrition II: Labels: -Discuss the different components of food labels and how to read food labels.   PULMONARY REHAB OTHER RESPIRATORY from 10/08/2019 in [place]  Date  10/08/19  Educator  D. Coad  Instruction Review Code  1- Verbalizes Understanding      Respiratory Infections: - Discuss the signs and symptoms of respiratory infections, ways to prevent respiratory infections, and the importance of seeking medical treatment when having a respiratory infection.   Stress I: Signs and Symptoms: - Discuss the causes of stress, how stress may lead to anxiety and depression, and ways to limit stress.   Stress II: Relaxation: -Discuss relaxation techniques to limit stress.   Oxygen for Home/Travel: - Discuss how to prepare for travel when on oxygen and proper ways to transport and store oxygen to ensure safety.   Knowledge Questionnaire Score: Knowledge Questionnaire Score - 09/23/19 1141      Knowledge Questionnaire Score   Pre Score  14/18       Core Components/Risk Factors/Patient Goals at Admission: Personal Goals and Risk Factors at Admission - 09/23/19 1142      Core Components/Risk Factors/Patient Goals on Admission    Weight Management  Yes    Intervention  Weight Management/Obesity: Establish reasonable short term and long term weight goals.    Admit Weight  311 lb 4.8 oz (141.2 kg)    Goal Weight: Short Term  286 lb 4.8 oz (129.9 kg)    Goal Weight: Long Term  236 lb 4.8 oz (107.2 kg)     Expected Outcomes  Short Term: Continue to assess and modify interventions until short term weight is achieved;Long Term: Adherence to nutrition and physical activity/exercise program aimed toward attainment of established weight goal    Personal Goal Other  Yes    Personal Goal  Get lungs stronger, Lose weight, get off oxygen, be healthier overall.    Intervention  Attend class 2 x week and to supplement with at home exercise plan that was given 3 x week.    Expected Outcomes  Reach expected goals.       Core Components/Risk Factors/Patient Goals Review:  Goals and Risk Factor Review    Row Name 10/20/19 1244             Core Components/Risk Factors/Patient Goals Review   Personal Goals Review  Weight Management/Obesity Get stronger; be healthier.       Review  Patient has completed 8 sessions gaining 2 bls since her initial visit. She is doing well in the program with consistent attendance. She says she is starting to feel stronger now and has a little more energy. She feels her lungs are getting stronger. She enjoys getting out and have a routine exercise time. She feels like she is making progress toward meeting her personal goals. Will continue to monitor for progress.       Expected Outcomes  Patient will continue to attend sessions and complete the program meeting both personal and program goals.          Core Components/Risk Factors/Patient Goals at Discharge (Final Review):  Goals and Risk Factor Review - 10/20/19 1244      Core Components/Risk Factors/Patient Goals Review   Personal Goals Review  Weight Management/Obesity   Get stronger; be healthier.  Review  Patient has completed 8 sessions gaining 2 bls since her initial visit. She is doing well in the program with consistent attendance. She says she is starting to feel stronger now and has a little more energy. She feels her lungs are getting stronger. She enjoys getting out and have a routine exercise time. She feels like  she is making progress toward meeting her personal goals. Will continue to monitor for progress.    Expected Outcomes  Patient will continue to attend sessions and complete the program meeting both personal and program goals.       ITP Comments: ITP Comments    Row Name 09/23/19 1131           ITP Comments  Patient is being sent by her pulmonary doctor due to having COVID-19. she is eager to get started and get stronger. Her goals are to gain lung strength, come off oxygen, and just have better health.          Comments: ITP REVIEW Pt is making expected progress toward pulmonary rehab goals after completing 8 sessions. Recommend continued exercise, life style modification, education, and utilization of breathing techniques to increase stamina and strength and decrease shortness of breath with exertion.

## 2019-10-21 ENCOUNTER — Ambulatory Visit: Payer: Commercial Managed Care - PPO | Admitting: Internal Medicine

## 2019-10-21 ENCOUNTER — Ambulatory Visit (INDEPENDENT_AMBULATORY_CARE_PROVIDER_SITE_OTHER): Payer: Commercial Managed Care - PPO | Admitting: Internal Medicine

## 2019-10-21 ENCOUNTER — Other Ambulatory Visit: Payer: Self-pay

## 2019-10-21 DIAGNOSIS — U071 COVID-19: Secondary | ICD-10-CM

## 2019-10-21 DIAGNOSIS — R0602 Shortness of breath: Secondary | ICD-10-CM | POA: Diagnosis not present

## 2019-10-21 LAB — PULMONARY FUNCTION TEST
DL/VA % pred: 95 %
DL/VA: 4 ml/min/mmHg/L
DLCO cor % pred: 78 %
DLCO cor: 18.11 ml/min/mmHg
DLCO unc % pred: 78 %
DLCO unc: 18.11 ml/min/mmHg
FEF 25-75 Post: 4.64 L/sec
FEF 25-75 Pre: 4.1 L/sec
FEF2575-%Change-Post: 13 %
FEF2575-%Pred-Post: 162 %
FEF2575-%Pred-Pre: 143 %
FEV1-%Change-Post: 1 %
FEV1-%Pred-Post: 99 %
FEV1-%Pred-Pre: 98 %
FEV1-Post: 3.04 L
FEV1-Pre: 3.01 L
FEV1FVC-%Change-Post: 1 %
FEV1FVC-%Pred-Pre: 110 %
FEV6-%Change-Post: 0 %
FEV6-%Pred-Post: 90 %
FEV6-%Pred-Pre: 90 %
FEV6-Post: 3.42 L
FEV6-Pre: 3.41 L
FEV6FVC-%Pred-Post: 102 %
FEV6FVC-%Pred-Pre: 102 %
FVC-%Change-Post: 0 %
FVC-%Pred-Post: 88 %
FVC-%Pred-Pre: 88 %
FVC-Post: 3.42 L
FVC-Pre: 3.42 L
Post FEV1/FVC ratio: 89 %
Post FEV6/FVC ratio: 100 %
Pre FEV1/FVC ratio: 88 %
Pre FEV6/FVC Ratio: 100 %
RV % pred: 55 %
RV: 1.1 L
TLC % pred: 81 %
TLC: 4.51 L

## 2019-10-21 NOTE — Progress Notes (Signed)
PFT done today. 

## 2019-10-22 ENCOUNTER — Encounter (HOSPITAL_COMMUNITY)
Admission: RE | Admit: 2019-10-22 | Discharge: 2019-10-22 | Disposition: A | Discharge status: Home / Self Care | Payer: Commercial Managed Care - PPO | Source: Ambulatory Visit | Attending: Internal Medicine | Admitting: Internal Medicine

## 2019-10-22 VITALS — Wt 313.0 lb

## 2019-10-22 DIAGNOSIS — R0602 Shortness of breath: Secondary | ICD-10-CM | POA: Diagnosis not present

## 2019-10-22 NOTE — Progress Notes (Signed)
Daily Session Note  Patient Details  Name: Gina Conner MRN: [id number] Date of Birth: [birth year] Referring Provider:     PULMONARY REHAB OTHER RESP ORIENTATION from 09/23/2019 in [place]  Referring Provider Dr. Shearon Stalls      Encounter Date: 10/22/2019  Check In:  Session Check In - 10/22/19 1445      Check-In   Supervising physician immediately available to respond to emergencies See telemetry face sheet for immediately available MD    Location [facility]    Staff Present Algis Downs, Exercise Physiologist;Debra Wynetta Emery, RN, BSN;Other    Virtual Visit No    Medication changes reported     No    Fall or balance concerns reported    No    Tobacco Cessation No Change    Warm-up and Cool-down Performed as group-led instruction    Resistance Training Performed Yes    VAD Patient? No    PAD/SET Patient? No      Pain Assessment   Currently in Pain? No/denies    Pain Score 0-No pain    Multiple Pain Sites No           Capillary Blood Glucose: No results found for this or any previous visit (from the past 24 hour(s)).    Social History   Tobacco Use  Smoking Status Never Smoker  Smokeless Tobacco Never Used    Goals Met:  Proper associated with RPD/PD & O2 Sat Independence with exercise equipment Improved SOB with ADL's Using PLB without cueing & demonstrates good technique Exercise tolerated well Personal goals reviewed No report of cardiac concerns or symptoms Strength training completed today  Goals Unmet:  Not Applicable  Comments: check out 15:45   Dr. Kathie Dike is Medical Director for [facility] Pulmonary Re[facility].

## 2019-10-26 ENCOUNTER — Ambulatory Visit (INDEPENDENT_AMBULATORY_CARE_PROVIDER_SITE_OTHER): Payer: Commercial Managed Care - PPO

## 2019-10-26 ENCOUNTER — Encounter: Payer: Self-pay | Admitting: Adult Health

## 2019-10-26 ENCOUNTER — Other Ambulatory Visit: Payer: Self-pay

## 2019-10-26 ENCOUNTER — Ambulatory Visit (INDEPENDENT_AMBULATORY_CARE_PROVIDER_SITE_OTHER): Payer: Commercial Managed Care - PPO | Admitting: Adult Health

## 2019-10-26 VITALS — BP 122/82 | HR 71 | Temp 97.6°F | Ht 68.0 in | Wt 314.8 lb

## 2019-10-26 DIAGNOSIS — R0609 Other forms of dyspnea: Secondary | ICD-10-CM

## 2019-10-26 DIAGNOSIS — J9611 Chronic respiratory failure with hypoxia: Secondary | ICD-10-CM | POA: Diagnosis not present

## 2019-10-26 DIAGNOSIS — J1282 Pneumonia due to coronavirus disease 2019: Secondary | ICD-10-CM

## 2019-10-26 DIAGNOSIS — U071 COVID-19: Secondary | ICD-10-CM

## 2019-10-26 DIAGNOSIS — J9600 Acute respiratory failure, unspecified whether with hypoxia or hypercapnia: Secondary | ICD-10-CM

## 2019-10-26 DIAGNOSIS — R0600 Dyspnea, unspecified: Secondary | ICD-10-CM | POA: Diagnosis not present

## 2019-10-26 MED ORDER — ALBUTEROL SULFATE HFA 108 (90 BASE) MCG/ACT IN AERS: 2.0000 | INHALATION_SPRAY | Freq: Four times a day (QID) | RESPIRATORY_TRACT | 0 refills | Status: DC | PRN

## 2019-10-26 NOTE — Patient Instructions (Addendum)
Set up overnight oximetry test on room air.  Continue on with pulmonary rehab, advance activity as able.  Albuterol Inhaler As needed  .  Chest xray today.  Follow up with Dr. Shearon Stalls in 4 months and As needed

## 2019-10-26 NOTE — Assessment & Plan Note (Addendum)
progressively improved with her shortness of breath.  Would continue pulmonary rehab as she may have a component of physical deconditioning.  Pulmonary function testing today shows normal lung function.  No airflow obstruction or restriction.  She has a very mild diffusing with diffusing capacity at 78%.  She has no oxygen desaturations with activity or exercise.  Will check overnight oximetry test as above . she may have a component of reactive airways.  May use her albuterol as needed.  And advance activity as tolerated.

## 2019-10-26 NOTE — Assessment & Plan Note (Signed)
Post Covid respiratory failure requiring oxygen.  She has progressively improved.  Now not requiring oxygen during the daytime with activity or with exercise or at rest. We will check an overnight oximetry test to determine if she still requires oxygen at bedtime

## 2019-10-26 NOTE — Progress Notes (Signed)
@Patient  ID: Gina Conner, female    DOB: 24-Jan-1966, 54 y.o.   MRN: [id number]  Chief Complaint  Patient presents with  . 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  10/26/2019 Follow up : Dyspnea , Covid PNA hx  Patient presents for a 76-month follow-up.  Patient was seen last visit for a pulmonary consult.  Patient was diagnosed with COVID-19 05/12/2019.  She had progressive symptoms and was hospitalized early January 2021.  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  She was treated with remdesivir and steroids.  She did require oxygen on discharge. Over the last few months.  She is starting to feel better she is getting more strength and improved activity tolerance.  She still gets short of breath with heavy exercise.  She has started pulmonary rehab.  She has been weaned off of oxygen during the daytime.  But uses 2 L at bedtime.  She has not returned back to her full activity prior to Covid.  Patient says she does not desaturate with exercise at pulmonary rehab.  She uses albuterol or nebulizer on average about once a week or every 2 weeks.  This has decreased as well.  She says since Covid she does have some residual joint pain issues that she did not have prior to Covid.  Chest x-ray done January 2021 did show bilateral patchy airspace opacities. Patient was set up for pulmonary function testing that was done today that showed normal lung function with no airflow obstruction or restriction.  FEV1 99%, ratio  89, FVC 88%, no significant bronchodilator response.  No mid flow obstruction.  DLCO 78%.  O2 saturations today are 100% on room air.  Past Medical History:  Diagnosis Date  . DVT (deep venous thrombosis) ([place])    provoked during tendon surgery was treated 3 months of coumadin  . 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  Smoking history: never smoker, no significant passive smoke exposure   Outpatient Medications Prior to Visit  Medication Sig Dispense Refill  . acetaminophen (TYLENOL) 500 MG tablet Take 1,000 mg by mouth every 6 (six) hours as needed for mild pain or headache.     . ALPRAZolam (XANAX) 0.5 MG tablet Take 0.5 mg by mouth at bedtime as needed for sleep.    . Cholecalciferol (D 5000) 5000 units capsule Take 5,000 Units by mouth daily.    Marland Kitchen ipratropium-albuterol (DUONEB) 0.5-2.5 (3) MG/3ML SOLN Take 3 mLs by nebulization every 6 (six) hours as needed (shortness of breath). 360 mL 3  . levothyroxine (SYNTHROID) 88 MCG tablet Take 88 mcg by mouth daily before breakfast. Pt unsure of strength.     . spironolactone (ALDACTONE) 25 MG tablet Take 25 mg by mouth at bedtime.    Marland Kitchen albuterol (VENTOLIN HFA) 108 (90  Base) MCG/ACT inhaler Inhale 2 puffs into the lungs every 6 (six) hours as needed for wheezing or shortness of breath. 6.7 g 0  . NP THYROID 60 MG tablet Take 60 mg by mouth daily before breakfast.     No facility-administered medications prior to visit.     Review of Systems:   Constitutional:   No  weight loss, night sweats,  Fevers, chills,  +fatigue, or  lassitude.  HEENT:   No headaches,  Difficulty swallowing,  Tooth/dental problems, or  Sore throat,                No sneezing, itching, ear ache, nasal congestion, post nasal  drip,   CV:  No chest pain,  Orthopnea, PND, swelling in lower extremities, anasarca, dizziness, palpitations, syncope.   GI  No heartburn, indigestion, abdominal pain, nausea, vomiting, diarrhea, change in bowel habits, loss of appetite, bloody stools.   Resp: No shortness of breath with exertion or at rest.  No excess mucus, no productive cough,  No non-productive cough,  No coughing up of blood.  No change in color of mucus.  No wheezing.  No chest wall deformity  Skin: no rash or lesions.  GU: no dysuria, change in color of urine, no urgency or frequency.  No flank pain, no hematuria   MS: Joint pains no swelling    Physical Exam  BP 122/82 (BP Location: Left Arm, Cuff Size: Normal)   Pulse 71   Temp 97.6 F (36.4 C) (Oral)   Ht 5\' 8"  (1.727 m)   Wt (!) 314 lb 12.8 oz (142.8 kg)   SpO2 100%   BMI 47.87 kg/m   GEN: A/Ox3; pleasant , NAD, BMI 47   HEENT:  [place]/AT, , NOSE-clear, THROAT-clear, no lesions, no postnasal drip or exudate noted.   NECK:  Supple w/ fair ROM; no JVD; normal carotid impulses w/o bruits; no thyromegaly or nodules palpated; no lymphadenopathy.    RESP  Clear  P & A; w/o, wheezes/ rales/ or rhonchi. no accessory muscle use, no dullness to percussion  CARD:  RRR, no m/r/g, no peripheral edema, pulses intact, no cyanosis or clubbing.  GI:   Soft & nt; nml bowel sounds; no organomegaly or masses detected.   Musco: Warm bil, no deformities or joint swelling noted.   Neuro: alert, no focal deficits noted.    Skin: Warm, no lesions or rashes    Lab Results:  CBC  ProBNP No results found for: PROBNP  Imaging: No results found.    PFT Results Latest Ref Rng & Units 10/21/2019  FVC-Pre L 3.42  FVC-Predicted Pre % 88  FVC-Post L 3.42  FVC-Predicted Post % 88  Pre FEV1/FVC % % 88  Post FEV1/FCV % % 89  FEV1-Pre L 3.01  FEV1-Predicted Pre % 98  FEV1-Post L 3.04  DLCO UNC% % 78  DLCO COR %Predicted % 95  TLC L 4.51  TLC % Predicted % 81   RV % Predicted % 55    No results found for: NITRICOXIDE      Assessment & Plan:   Pneumonia due to COVID-19 virus COVID-19 infection with Covid pneumonia in January 2021.  Patient needs follow-up chest x-ray for clearance. Clinically  improved.  Chronic respiratory failure with hypoxia (HCC) Post Covid respiratory failure requiring oxygen.  She has progressively improved.  Now not requiring oxygen during the daytime with activity or with exercise or at rest. We will check an overnight oximetry test to determine if  she still requires oxygen at bedtime  Dyspnea progressively improved with her shortness of breath.  Would continue pulmonary rehab as she may have a component of physical deconditioning.  Pulmonary function testing today shows normal lung function.  No airflow obstruction or restriction.  She has a very mild diffusing with diffusing capacity at 78%.  She has no oxygen desaturations with activity or exercise.  Will check overnight oximetry test as above . she may have a component of reactive airways.  May use her albuterol as needed.  And advance activity as tolerated.     Rexene Edison, NP 10/26/2019

## 2019-10-26 NOTE — Assessment & Plan Note (Signed)
COVID-19 infection with Covid pneumonia in January 2021.  Patient needs follow-up chest x-ray for clearance. Clinically  improved.

## 2019-10-26 NOTE — Addendum Note (Signed)
Addended byCoralie Keens on: 10/26/2019 02:42 PM   Modules accepted: Orders

## 2019-10-27 ENCOUNTER — Encounter (HOSPITAL_COMMUNITY)
Admission: RE | Admit: 2019-10-27 | Discharge: 2019-10-27 | Disposition: A | Discharge status: Home / Self Care | Payer: Commercial Managed Care - PPO | Source: Ambulatory Visit | Attending: Internal Medicine | Admitting: Internal Medicine

## 2019-10-27 ENCOUNTER — Other Ambulatory Visit: Payer: Self-pay

## 2019-10-27 DIAGNOSIS — R0602 Shortness of breath: Secondary | ICD-10-CM

## 2019-10-27 NOTE — Progress Notes (Signed)
Daily Session Note  Patient Details  Name: Gina Conner MRN: [id number] Date of Birth: [birth year] Referring Provider:     PULMONARY REHAB OTHER RESP ORIENTATION from 09/23/2019 in [place]  Referring Provider Dr. Shearon Stalls      Encounter Date: 10/27/2019  Check In:  Session Check In - 10/27/19 1445      Check-In   Supervising physician immediately available to respond to emergencies See telemetry face sheet for immediately available MD    Location [facility]    Staff Present Other   Ramon Dredge RN   Virtual Visit No    Medication changes reported     No    Fall or balance concerns reported    No    Tobacco Cessation No Change    Warm-up and Cool-down Performed as group-led instruction    Resistance Training Performed Yes    VAD Patient? No    PAD/SET Patient? No      Pain Assessment   Currently in Pain? No/denies    Pain Score 0-No pain    Multiple Pain Sites No           Capillary Blood Glucose: No results found for this or any previous visit (from the past 24 hour(s)).    Social History   Tobacco Use  Smoking Status Never Smoker  Smokeless Tobacco Never Used    Goals Met:  Proper associated with RPD/PD & O2 Sat Independence with exercise equipment Improved SOB with ADL's Using PLB without cueing & demonstrates good technique Exercise tolerated well No report of cardiac concerns or symptoms Strength training completed today  Goals Unmet:  Not Applicable  Comments: session time 2:45pm-3:45pm   Dr. Kathie Dike is Medical Director for [facility] Pul[facility] Re[facility].

## 2019-10-29 ENCOUNTER — Encounter (HOSPITAL_COMMUNITY)
Admission: RE | Admit: 2019-10-29 | Discharge: 2019-10-29 | Disposition: A | Discharge status: Home / Self Care | Payer: Commercial Managed Care - PPO | Source: Ambulatory Visit | Attending: Internal Medicine | Admitting: Internal Medicine

## 2019-10-29 ENCOUNTER — Other Ambulatory Visit: Payer: Self-pay

## 2019-10-29 DIAGNOSIS — R0602 Shortness of breath: Secondary | ICD-10-CM | POA: Diagnosis not present

## 2019-10-29 NOTE — Progress Notes (Signed)
Daily Session Note  Patient Details  Name: Gina Conner MRN: [id number] Date of Birth: 1965-05-28 Referring Provider:     PULMONARY REHAB OTHER RESP ORIENTATION from 09/23/2019 in [place]  Referring Provider Dr. Shearon Stalls      Encounter Date: 10/29/2019  Check In:  Session Check In - 10/29/19 1443      Check-In   Supervising physician immediately available to respond to emergencies See telemetry face sheet for immediately available MD    Location [facility]    Staff Present Algis Downs, Exercise Physiologist;Asuncion Shibata Kris Mouton, MS, ACSM-CEP, Exercise Physiologist;Debra Wynetta Emery, RN, BSN    Virtual Visit No    Medication changes reported     No    Fall or balance concerns reported    No    Tobacco Cessation No Change    Warm-up and Cool-down Performed as group-led instruction    Resistance Training Performed Yes    VAD Patient? No    PAD/SET Patient? No      Pain Assessment   Currently in Pain? No/denies    Pain Score 0-No pain    Multiple Pain Sites No           Capillary Blood Glucose: No results found for this or any previous visit (from the past 24 hour(s)).    Social History   Tobacco Use  Smoking Status Never Smoker  Smokeless Tobacco Never Used    Goals Met:  Independence with exercise equipment Exercise tolerated well Strength training completed today  Goals Unmet:  Not Applicable  Comments: checkout time is 1545   Dr. Kate Sable is Medical Director for [place] and Pulmonary Rehab.

## 2019-11-03 ENCOUNTER — Other Ambulatory Visit: Payer: Self-pay

## 2019-11-03 ENCOUNTER — Encounter (HOSPITAL_COMMUNITY)
Admission: RE | Admit: 2019-11-03 | Discharge: 2019-11-03 | Disposition: A | Discharge status: Home / Self Care | Payer: Commercial Managed Care - PPO | Source: Ambulatory Visit | Attending: Internal Medicine | Admitting: Internal Medicine

## 2019-11-03 DIAGNOSIS — R0602 Shortness of breath: Secondary | ICD-10-CM

## 2019-11-03 NOTE — Progress Notes (Signed)
Daily Session Note  Patient Details  Name: Gina Conner MRN: [id number] Date of Birth: [birth year] Referring Provider:     PULMONARY REHAB OTHER RESP ORIENTATION from 09/23/2019 in [place]  Referring Provider Dr. Shearon Stalls      Encounter Date: 11/03/2019  Check In:  Session Check In - 11/03/19 1453      Check-In   Location [facility]    Staff Present Ramon Dredge, RN, MHA;Vaniece Hatchett, Exercise Physiologist    Virtual Visit No    Medication changes reported     No    Fall or balance concerns reported    No    Tobacco Cessation No Change    Warm-up and Cool-down Performed as group-led instruction    Resistance Training Performed Yes    VAD Patient? No    PAD/SET Patient? No      Pain Assessment   Currently in Pain? No/denies    Pain Score 0-No pain    Multiple Pain Sites No           Capillary Blood Glucose: No results found for this or any previous visit (from the past 24 hour(s)).    Social History   Tobacco Use  Smoking Status Never Smoker  Smokeless Tobacco Never Used    Goals Met:  Proper associated with RPD/PD & O2 Sat Independence with exercise equipment Exercise tolerated well No report of cardiac concerns or symptoms Strength training completed today  Goals Unmet:  Not Applicable  Comments: Session 2:45pm - 3:45pm   Dr. Kathie Dike is Medical Director for [facility] Pulmonary Rehab.

## 2019-11-05 ENCOUNTER — Encounter (HOSPITAL_COMMUNITY)
Admission: RE | Admit: 2019-11-05 | Discharge: 2019-11-05 | Disposition: A | Discharge status: Home / Self Care | Payer: Commercial Managed Care - PPO | Source: Ambulatory Visit | Attending: Internal Medicine | Admitting: Internal Medicine

## 2019-11-05 VITALS — Wt 314.8 lb

## 2019-11-05 DIAGNOSIS — R0602 Shortness of breath: Secondary | ICD-10-CM | POA: Diagnosis not present

## 2019-11-05 NOTE — Progress Notes (Signed)
Daily Session Note  Patient Details  Name: Gina Conner MRN: [id number] Date of Birth: September 04, 1965 Referring Provider:     PULMONARY REHAB OTHER RESP ORIENTATION from 09/23/2019 in [place]  Referring Provider Dr. Shearon Stalls      Encounter Date: 11/05/2019  Check In:  Session Check In - 11/05/19 1456      Check-In   Supervising physician immediately available to respond to emergencies See telemetry face sheet for immediately available MD    Location [facility]    Staff Present Algis Downs, Exercise Physiologist;Debra Wynetta Emery, RN, BSN    Virtual Visit No    Medication changes reported     No    Fall or balance concerns reported    No    Tobacco Cessation No Change    Warm-up and Cool-down Performed as group-led instruction    Resistance Training Performed Yes    VAD Patient? No    PAD/SET Patient? No      Pain Assessment   Currently in Pain? No/denies    Pain Score 0-No pain    Multiple Pain Sites No           Capillary Blood Glucose: No results found for this or any previous visit (from the past 24 hour(s)).    Social History   Tobacco Use  Smoking Status Never Smoker  Smokeless Tobacco Never Used    Goals Met:  Proper associated with RPD/PD & O2 Sat Independence with exercise equipment Improved SOB with ADL's Using PLB without cueing & demonstrates good technique Exercise tolerated well No report of cardiac concerns or symptoms Strength training completed today  Goals Unmet:  Not Applicable  Comments: Check out: [place]   Dr. Kathie Dike is Medical Director for [facility] Pulmonary Rehab.

## 2019-11-10 ENCOUNTER — Other Ambulatory Visit: Payer: Self-pay

## 2019-11-10 ENCOUNTER — Encounter (HOSPITAL_COMMUNITY)
Admission: RE | Admit: 2019-11-10 | Discharge: 2019-11-10 | Disposition: A | Discharge status: Home / Self Care | Payer: Commercial Managed Care - PPO | Source: Ambulatory Visit | Attending: Internal Medicine | Admitting: Internal Medicine

## 2019-11-10 DIAGNOSIS — R0602 Shortness of breath: Secondary | ICD-10-CM | POA: Diagnosis not present

## 2019-11-10 NOTE — Progress Notes (Signed)
Daily Session Note  Patient Details  Name: Gina Conner MRN: [id number] Date of Birth: 12/25/1965 Referring Provider:     PULMONARY REHAB OTHER RESP ORIENTATION from 09/23/2019 in [place]  Referring Provider Dr. Shearon Stalls      Encounter Date: 11/10/2019  Check In:  Session Check In - 11/10/19 1446      Check-In   Supervising physician immediately available to respond to emergencies See telemetry face sheet for immediately available MD    Location [facility]    Staff Present Geanie Cooley, RN;Vaniece Hatchett, Exercise Physiologist;Milam Allbaugh Rosezella [place], RN, [place]    Virtual Visit No    Medication changes reported     No    Fall or balance concerns reported    No    Tobacco Cessation No Change    Warm-up and Cool-down Performed as group-led instruction    Resistance Training Performed Yes    VAD Patient? No    PAD/SET Patient? No      Pain Assessment   Currently in Pain? No/denies    Pain Score 0-No pain    Multiple Pain Sites No           Capillary Blood Glucose: No results found for this or any previous visit (from the past 24 hour(s)).    Social History   Tobacco Use  Smoking Status Never Smoker  Smokeless Tobacco Never Used    Goals Met:  Proper associated with RPD/PD & O2 Sat Independence with exercise equipment Using PLB without cueing & demonstrates good technique Exercise tolerated well Strength training completed today  Goals Unmet:  Not Applicable  Comments: session 2:45p-3:45p   Dr. Kathie Dike is Medical Director for [facility] Pulmonary Rehab.

## 2019-11-12 ENCOUNTER — Encounter (HOSPITAL_COMMUNITY)
Admission: RE | Admit: 2019-11-12 | Discharge: 2019-11-12 | Disposition: A | Discharge status: Home / Self Care | Payer: Commercial Managed Care - PPO | Source: Ambulatory Visit | Attending: Internal Medicine | Admitting: Internal Medicine

## 2019-11-12 ENCOUNTER — Other Ambulatory Visit: Payer: Self-pay

## 2019-11-12 DIAGNOSIS — R0602 Shortness of breath: Secondary | ICD-10-CM | POA: Diagnosis not present

## 2019-11-12 NOTE — Progress Notes (Signed)
Daily Session Note  Patient Details  Name: Gina Conner MRN: [id number] Date of Birth: [birth year] Referring Provider:     PULMONARY REHAB OTHER RESP ORIENTATION from 09/23/2019 in [place]  Referring Provider Dr. Shearon Stalls      Encounter Date: 11/12/2019  Check In:  Session Check In - 11/12/19 1445      Check-In   Supervising physician immediately available to respond to emergencies See telemetry face sheet for immediately available MD    Staff Present Algis Downs, Exercise Physiologist;Debra Wynetta Emery, RN, BSN    Virtual Visit No    Medication changes reported     No    Fall or balance concerns reported    No    Tobacco Cessation No Change    Warm-up and Cool-down Performed as group-led instruction    Resistance Training Performed Yes    VAD Patient? No    PAD/SET Patient? No      Pain Assessment   Currently in Pain? No/denies    Pain Score 0-No pain    Multiple Pain Sites No           Capillary Blood Glucose: No results found for this or any previous visit (from the past 24 hour(s)).    Social History   Tobacco Use  Smoking Status Never Smoker  Smokeless Tobacco Never Used    Goals Met:  Proper associated with RPD/PD & O2 Sat Independence with exercise equipment Improved SOB with ADL's Using PLB without cueing & demonstrates good technique Exercise tolerated well Personal goals reviewed No report of cardiac concerns or symptoms Strength training completed today  Goals Unmet:  Not Applicable  Comments: Check out: [place]   Dr. Kathie Dike is Medical Director for [facility] Pulmonary Rehab.

## 2019-11-17 ENCOUNTER — Other Ambulatory Visit: Payer: Self-pay

## 2019-11-17 ENCOUNTER — Encounter (HOSPITAL_COMMUNITY)
Admission: RE | Admit: 2019-11-17 | Discharge: 2019-11-17 | Disposition: A | Discharge status: Home / Self Care | Payer: Commercial Managed Care - PPO | Source: Ambulatory Visit | Attending: Internal Medicine | Admitting: Internal Medicine

## 2019-11-17 DIAGNOSIS — R0602 Shortness of breath: Secondary | ICD-10-CM

## 2019-11-17 NOTE — Progress Notes (Signed)
Daily Session Note  Patient Details  Name: Gina Conner MRN: [id number] Date of Birth: [birth year] Referring Provider:     PULMONARY REHAB OTHER RESP ORIENTATION from 09/23/2019 in [place]  Referring Provider Dr. Shearon Stalls      Encounter Date: 11/17/2019  Check In:  Session Check In - 11/17/19 1456      Check-In   Supervising physician immediately available to respond to emergencies See telemetry face sheet for immediately available MD    Staff Present Geanie Cooley, RN;Vaniece Hatchett, Exercise Physiologist;Jacque Byron Rosezella [place], RN, [employer]    Virtual Visit No    Medication changes reported     No    Fall or balance concerns reported    No    Tobacco Cessation No Change    Warm-up and Cool-down Performed as group-led instruction    Resistance Training Performed Yes    VAD Patient? No    PAD/SET Patient? No      Pain Assessment   Currently in Pain? No/denies    Pain Score 0-No pain    Multiple Pain Sites No           Capillary Blood Glucose: No results found for this or any previous visit (from the past 24 hour(s)).    Social History   Tobacco Use  Smoking Status Never Smoker  Smokeless Tobacco Never Used    Goals Met:  Proper associated with RPD/PD & O2 Sat Independence with exercise equipment Using PLB without cueing & demonstrates good technique Exercise tolerated well Strength training completed today  Goals Unmet:  Not Applicable  Comments: 7530-1040   Dr. Kathie Dike is Medical Director for [facility].

## 2019-11-18 NOTE — Progress Notes (Signed)
Pulmonary Individual Treatment Plan  Patient Details  Name: Gina Conner MRN: [id number] Date of Birth: 03-18-1966 Referring Provider:     PULMONARY REHAB OTHER RESP ORIENTATION from 09/23/2019 in [place]  Referring Provider Dr. Shearon Stalls      Initial Encounter Date:    [place] from 09/23/2019 in [place]  Date 09/23/19      Visit Diagnosis: SOB (shortness of breath)  Patient's Home Medications on Admission:   Current Outpatient Medications:  .  acetaminophen (TYLENOL) 500 MG tablet, Take 1,000 mg by mouth every 6 (six) hours as needed for mild pain or headache. , Disp: , Rfl:  .  albuterol (VENTOLIN HFA) 108 (90 Base) MCG/ACT inhaler, Inhale 2 puffs into the lungs every 6 (six) hours as needed for wheezing or shortness of breath., Disp: 6.7 g, Rfl: 0 .  ALPRAZolam (XANAX) 0.5 MG tablet, Take 0.5 mg by mouth at bedtime as needed for sleep., Disp: , Rfl:  .  Cholecalciferol (D 5000) 5000 units capsule, Take 5,000 Units by mouth daily., Disp: , Rfl:  .  ipratropium-albuterol (DUONEB) 0.5-2.5 (3) MG/3ML SOLN, Take 3 mLs by nebulization every 6 (six) hours as needed (shortness of breath)., Disp: 360 mL, Rfl: 3 .  levothyroxine (SYNTHROID) 88 MCG tablet, Take 88 mcg by mouth daily before breakfast. Pt unsure of strength. , Disp: , Rfl:  .  spironolactone (ALDACTONE) 25 MG tablet, Take 25 mg by mouth at bedtime., Disp: , Rfl:   Past Medical History: Past Medical History:  Diagnosis Date  . DVT (deep venous thrombosis) ([place])    provoked during tendon surgery was treated 3 months of coumadin  . Hypothyroidism     Tobacco Use: Social History   Tobacco Use  Smoking Status Never Smoker  Smokeless Tobacco Never Used    Labs: Recent Review Flowsheet Data    Labs for ITP Cardiac and Pulmonary Rehab Latest Ref Rng & Units 05/18/2019 05/22/2019 05/23/2019   Cholestrol 0 - 200 mg/dL - - 121   LDLCALC 0 - 99  mg/dL - - 68   HDL >40 mg/dL - - 31(L)   Trlycerides <150 mg/dL 126 - 108   Hemoglobin A1c 4.8 - 5.6 % - 6.2(H) -      Capillary Blood Glucose: No results found for: GLUCAP   Pulmonary Assessment Scores:  Pulmonary Assessment Scores    Row Name 09/23/19 1137         ADL UCSD   ADL Phase Entry     SOB Score total 53     Rest 0     Walk 10     Stairs 4     Bath 2     Dress 2     Shop 3       CAT Score   CAT Score 20       mMRC Score   mMRC Score 3           UCSD: Self-administered rating of dyspnea associated with activities of daily living (ADLs) 6-point scale (0 = "not at all" to 5 = "maximal or unable to do because of breathlessness")  Scoring Scores range from 0 to 120.  Minimally important difference is 5 units  CAT: CAT can identify the health impairment of COPD patients and is better correlated with disease progression.  CAT has a scoring range of zero to 40. The CAT score is classified into four groups of low (less than 10), medium (  10 - 20), high (21-30) and very high (31-40) based on the impact level of disease on health status. A CAT score over 10 suggests significant symptoms.  A worsening CAT score could be explained by an exacerbation, poor medication adherence, poor inhaler technique, or progression of COPD or comorbid conditions.  CAT MCID is 2 points  mMRC: mMRC (Modified Medical Research Council) Dyspnea Scale is used to assess the degree of baseline functional disability in patients of respiratory disease due to dyspnea. No minimal important difference is established. A decrease in score of 1 point or greater is considered a positive change.   Pulmonary Function Assessment:   Exercise Target Goals: Exercise Program Goal: Individual exercise prescription set using results from initial 6 min walk test and THRR while considering  patient's activity barriers and safety.   Exercise Prescription Goal: Initial exercise prescription builds to 30-45  minutes a day of aerobic activity, 2-3 days per week.  Home exercise guidelines will be given to patient during program as part of exercise prescription that the participant will acknowledge.  Activity Barriers & Risk Stratification:  Activity Barriers & Cardiac Risk Stratification - 09/23/19 1142      Activity Barriers & Cardiac Risk Stratification   Activity Barriers Deconditioning;Shortness of Breath    Cardiac Risk Stratification Low           6 Minute Walk:  6 Minute Walk    Row Name 09/23/19 1139         6 Minute Walk   Phase Initial     Distance 1200 feet     Walk Time 6 minutes     # of Rest Breaks 0     MPH 2.27     METS 2.74     RPE 13     Perceived Dyspnea  13     VO2 Peak 9.44     Symptoms No     Resting HR 76 bpm     Resting BP 110/80     Resting Oxygen Saturation  97 %     Exercise Oxygen Saturation  during 6 min walk 94 %     Max Ex. HR 110 bpm     Max Ex. BP 146/106     2 Minute Post BP 110/98            Oxygen Initial Assessment:  Oxygen Initial Assessment - 09/23/19 1135      Home Oxygen   Home Oxygen Device E-Tanks    Sleep Oxygen Prescription None    Home Exercise Oxygen Prescription Continuous    Liters per minute 1    Home at Rest Exercise Oxygen Prescription None    Compliance with Home Oxygen Use Yes      Initial 6 min Walk   Oxygen Used None      Program Oxygen Prescription   Program Oxygen Prescription Continuous   As needed   Liters per minute 2      Intervention   Short Term Goals To learn and exhibit compliance with exercise, home and travel O2 prescription;To learn and understand importance of maintaining oxygen saturations>88%;To learn and demonstrate proper pursed lip breathing techniques or other breathing techniques.    Long  Term Goals Exhibits compliance with exercise, home and travel O2 prescription;Maintenance of O2 saturations>88%           Oxygen Re-Evaluation:  Oxygen Re-Evaluation    Row Name 10/20/19  1241 11/18/19 0944  Program Oxygen Prescription   Program Oxygen Prescription None None        Home Oxygen   Home Oxygen Device None None      Sleep Oxygen Prescription None None      Home Exercise Oxygen Prescription None None      Home at Rest Exercise Oxygen Prescription None None      Compliance with Home Oxygen Use Yes Yes        Goals/Expected Outcomes   Short Term Goals To learn and exhibit compliance with exercise, home and travel O2 prescription;To learn and understand importance of maintaining oxygen saturations>88%;To learn and demonstrate proper pursed lip breathing techniques or other breathing techniques. To learn and exhibit compliance with exercise, home and travel O2 prescription;To learn and understand importance of maintaining oxygen saturations>88%;To learn and demonstrate proper pursed lip breathing techniques or other breathing techniques.      Long  Term Goals Exhibits compliance with exercise, home and travel O2 prescription;Maintenance of O2 saturations>88% Exhibits compliance with exercise, home and travel O2 prescription;Maintenance of O2 saturations>88%      Comments Patient is meeting her expected goals and outcomes both long and short term. Will continue to monitor for progress. Patient is meeting her expected goals and outcomes both long and short term. Will continue to monitor for progress.      Goals/Expected Outcomes Patient will continue to meet her expected goals and outcomes. Patient will continue to meet her expected goals and outcomes.             Oxygen Discharge (Final Oxygen Re-Evaluation):  Oxygen Re-Evaluation - 11/18/19 0944      Program Oxygen Prescription   Program Oxygen Prescription None      Home Oxygen   Home Oxygen Device None    Sleep Oxygen Prescription None    Home Exercise Oxygen Prescription None    Home at Rest Exercise Oxygen Prescription None    Compliance with Home Oxygen Use Yes      Goals/Expected  Outcomes   Short Term Goals To learn and exhibit compliance with exercise, home and travel O2 prescription;To learn and understand importance of maintaining oxygen saturations>88%;To learn and demonstrate proper pursed lip breathing techniques or other breathing techniques.    Long  Term Goals Exhibits compliance with exercise, home and travel O2 prescription;Maintenance of O2 saturations>88%    Comments Patient is meeting her expected goals and outcomes both long and short term. Will continue to monitor for progress.    Goals/Expected Outcomes Patient will continue to meet her expected goals and outcomes.           Initial Exercise Prescription:  Initial Exercise Prescription - 09/23/19 1100      Date of Initial Exercise RX and Referring Provider   Date 09/23/19    Referring Provider Dr. Shearon Stalls    Expected Discharge Date 12/24/19      Treadmill   MPH 1.2    Grade 0    Minutes 17    METs 1.91      T5 Nustep   Level 1    SPM 73    Minutes 22    METs 1.7      Prescription Details   Frequency (times per week) 2    Duration Progress to 30 minutes of continuous aerobic without signs/symptoms of physical distress      Intensity   Ratings of Perceived Exertion 11-13    Perceived Dyspnea 0-4      Progression   Progression  Continue progressive overload as per policy without signs/symptoms or physical distress.      Resistance Training   Training Prescription Yes    Weight 1    Reps 10-15           Perform Capillary Blood Glucose checks as needed.  Exercise Prescription Changes:   Exercise Prescription Changes    Row Name 10/09/19 1500 10/26/19 1400 11/09/19 1300         Response to Exercise   Blood Pressure (Admit) 122/88 138/70 120/82     Blood Pressure (Exercise) 142/86 152/88 118/80     Blood Pressure (Exit) 122/84 112/82 120/70     Heart Rate (Admit) 76 bpm 71 bpm 81 bpm     Heart Rate (Exercise) 97 bpm 99 bpm 102 bpm     Heart Rate (Exit) 81 bpm 84 bpm 90  bpm     Oxygen Saturation (Admit) 97 % 98 % 95 %     Oxygen Saturation (Exercise) 93 % 95 % 95 %     Oxygen Saturation (Exit) 96 % 92 % 97 %     Rating of Perceived Exertion (Exercise) 11 11 11      Perceived Dyspnea (Exercise) 11 11 11      Symptoms none -- --     Duration Continue with 30 min of aerobic exercise without signs/symptoms of physical distress. Continue with 30 min of aerobic exercise without signs/symptoms of physical distress. Continue with 30 min of aerobic exercise without signs/symptoms of physical distress.     Intensity THRR unchanged THRR unchanged THRR unchanged       Progression   Progression Continue to progress workloads to maintain intensity without signs/symptoms of physical distress. Continue to progress workloads to maintain intensity without signs/symptoms of physical distress. Continue to progress workloads to maintain intensity without signs/symptoms of physical distress.     Average METs 2.15 -- --       Resistance Training   Training Prescription Yes Yes Yes     Weight 2 2 2      Reps 10-15 10-15 10-15     Time 10 Minutes -- --       Oxygen   Liters 0 -- --       Treadmill   MPH 1.3 1.4 1.6     Grade 0 0 0     Minutes 17 17 17      METs 1.99 1.99 2.22       T5 Nustep   Level 1 4 2      SPM 105 118 126     Minutes 22 22 22      METs 2.4 2.5 2.3       Home Exercise Plan   Plans to continue exercise at Home (comment) -- --     Frequency Add 3 additional days to program exercise sessions. -- --     Initial Home Exercises Provided 09/23/19 -- --            Exercise Comments:   Exercise Comments    Row Name 09/23/19 1153 10/09/19 1525         Exercise Comments Patient preformed well just got of little short of breath. Patient is looking foward to starting the program. Patient has been here 6 sessions so far. Patient is preforming well and very motivated to reach her goals. We will continue to progress as tolerated.             Exercise  Goals and Review:   Exercise Goals  [place] Name 09/23/19 1151             Exercise Goals   Increase Physical Activity Yes       Intervention Provide advice, education, support and counseling about physical activity/exercise needs.;Develop an individualized exercise prescription for aerobic and resistive training based on initial evaluation findings, risk stratification, comorbidities and participant's personal goals.       Expected Outcomes Short Term: Attend rehab on a regular basis to increase amount of physical activity.;Long Term: Add in home exercise to make exercise part of routine and to increase amount of physical activity.       Increase Strength and Stamina Yes       Intervention Provide advice, education, support and counseling about physical activity/exercise needs.;Develop an individualized exercise prescription for aerobic and resistive training based on initial evaluation findings, risk stratification, comorbidities and participant's personal goals.       Expected Outcomes Long Term: Improve cardiorespiratory fitness, muscular endurance and strength as measured by increased METs and functional capacity (6MWT);Short Term: Perform resistance training exercises routinely during rehab and add in resistance training at home       Able to understand and use rate of perceived exertion (RPE) scale Yes       Intervention Provide education and explanation on how to use RPE scale       Expected Outcomes Short Term: Able to use RPE daily in rehab to express subjective intensity level;Long Term:  Able to use RPE to guide intensity level when exercising independently       Able to understand and use Dyspnea scale Yes       Intervention Provide education and explanation on how to use Dyspnea scale       Expected Outcomes Short Term: Able to use Dyspnea scale daily in rehab to express subjective sense of shortness of breath during exertion;Long Term: Able to use Dyspnea scale to guide intensity  level when exercising independently       Knowledge and understanding of Target Heart Rate Range (THRR) Yes       Intervention Provide education and explanation of THRR including how the numbers were predicted and where they are located for reference       Expected Outcomes Long Term: Able to use THRR to govern intensity when exercising independently;Short Term: Able to state/look up THRR       Able to check pulse independently Yes       Intervention Provide education and demonstration on how to check pulse in carotid and radial arteries.;Review the importance of being able to check your own pulse for safety during independent exercise       Expected Outcomes Short Term: Able to explain why pulse checking is important during independent exercise;Long Term: Able to check pulse independently and accurately       Understanding of Exercise Prescription Yes       Intervention Provide education, explanation, and written materials on patient's individual exercise prescription       Expected Outcomes Short Term: Able to explain program exercise prescription;Long Term: Able to explain home exercise prescription to exercise independently              Exercise Goals Re-Evaluation :  Exercise Goals Re-Evaluation    [place] Name 10/12/19 1252 11/13/19 1451           Exercise Goal Re-Evaluation   Exercise Goals Review Increase Physical Activity;Increase Strength and Stamina Increase Physical Activity;Increase Strength and Stamina  Comments Patients goals are to get lungs stronger, be healthier and to lose 50lbs. Patient feels that she is getting stronger with each visit. She says she is already able to do more at home and while shopping. Patient is not losing any weight. She has gained 2lbs. We will talk to her about her weight lose goals and to discuss her diet. We will continue to monitor her progress. She is breathing and feeling better so far.The program has motivated her to lose weight and live a  better lifestyle. She also feels better about exercising on her own since her oxygen levls have improved. She tolerates increased workloads well. She works hard towards her goals each session.      Expected Outcomes To reach her expected goals. To reach expected goals             Discharge Exercise Prescription (Final Exercise Prescription Changes):  Exercise Prescription Changes - 11/09/19 1300      Response to Exercise   Blood Pressure (Admit) 120/82    Blood Pressure (Exercise) 118/80    Blood Pressure (Exit) 120/70    Heart Rate (Admit) 81 bpm    Heart Rate (Exercise) 102 bpm    Heart Rate (Exit) 90 bpm    Oxygen Saturation (Admit) 95 %    Oxygen Saturation (Exercise) 95 %    Oxygen Saturation (Exit) 97 %    Rating of Perceived Exertion (Exercise) 11    Perceived Dyspnea (Exercise) 11    Duration Continue with 30 min of aerobic exercise without signs/symptoms of physical distress.    Intensity THRR unchanged      Progression   Progression Continue to progress workloads to maintain intensity without signs/symptoms of physical distress.      Resistance Training   Training Prescription Yes    Weight 2    Reps 10-15      Treadmill   MPH 1.6    Grade 0    Minutes 17    METs 2.22      T5 Nustep   Level 2    SPM 126    Minutes 22    METs 2.3           Nutrition:  Target Goals: Understanding of nutrition guidelines, daily intake of sodium 1500mg , cholesterol 200mg , calories 30% from fat and 7% or less from saturated fats, daily to have 5 or more servings of fruits and vegetables.  Biometrics:  Pre Biometrics - 09/23/19 1156      Pre Biometrics   Height 5\' 7"  (1.702 m)    Weight 141.2 kg    Waist Circumference 55 inches    Hip Circumference 60 inches    Waist to Hip Ratio 0.92 %    BMI (Calculated) 48.75    Triceps Skinfold 15 mm    % Body Fat 52.3 %    Grip Strength 25.53 kg    Flexibility 14.33 in    Single Leg Stand 9.29 seconds             Nutrition Therapy Plan and Nutrition Goals:  Nutrition Therapy & Goals - 10/20/19 1243      Personal Nutrition Goals   Comments We continue to work with out RD to schedule classes with new patients. In the interim, we are providing handouts for education. Patient's medficts diet assessment score was 35. She says she trys to eat healthy overall. Will continue to monitor.      Intervention Plan   Intervention Nutrition handout(s) given  to patient.           Nutrition Assessments:  Nutrition Assessments - 09/23/19 1141      MEDFICTS Scores   Pre Score 35           Nutrition Goals Re-Evaluation:   Nutrition Goals Discharge (Final Nutrition Goals Re-Evaluation):   Psychosocial: Target Goals: Acknowledge presence or absence of significant depression and/or stress, maximize coping skills, provide positive support system. Participant is able to verbalize types and ability to use techniques and skills needed for reducing stress and depression.  Initial Review & Psychosocial Screening:  Initial Psych Review & Screening - 09/23/19 1138      Initial Review   Current issues with Current Depression   All due to having COVID-19 and not being able to do ADL's     [place]? Yes      Barriers   Psychosocial barriers to participate in program The patient should benefit from training in stress management and relaxation.      Screening Interventions   Interventions Encouraged to exercise    Expected Outcomes Short Term goal: Identification and review with participant of any Quality of Life or Depression concerns found by scoring the questionnaire.;Long Term goal: The participant improves quality of Life and PHQ9 Scores as seen by post scores and/or verbalization of changes           Quality of Life Scores:  Quality of Life - 09/23/19 1200      Quality of Life   Select Quality of Life      Quality of Life Scores   Health/Function Pre 13.31 %     Socioeconomic Pre 25.5 %    Psych/Spiritual Pre 15.43 %    Family Pre 30 %    GLOBAL Pre 18.14 %          Scores of 19 and below usually indicate a poorer quality of life in these areas.  A difference of  2-3 points is a clinically meaningful difference.  A difference of 2-3 points in the total score of the Quality of Life Index has been associated with significant improvement in overall quality of life, self-image, physical symptoms, and general health in studies assessing change in quality of life.   PHQ-9: Recent Review Flowsheet Data    Depression screen [facility] 2/9 09/23/2019   Decreased Interest 0   Down, Depressed, Hopeless 1   PHQ - 2 Score 1   Altered sleeping 2   Tired, decreased energy 3   Change in appetite 1   Feeling bad or failure about yourself  1   Trouble concentrating 1   Moving slowly or fidgety/restless 0   Suicidal thoughts 0   PHQ-9 Score 9   Difficult doing work/chores Very difficult     Interpretation of Total Score  Total Score Depression Severity:  1-4 = Minimal depression, 5-9 = Mild depression, 10-14 = Moderate depression, 15-19 = Moderately severe depression, 20-27 = Severe depression   Psychosocial Evaluation and Intervention:  Psychosocial Evaluation - 09/23/19 1139      Psychosocial Evaluation & Interventions   Interventions Stress management education;Encouraged to exercise with the program and follow exercise prescription;Relaxation education    Continue Psychosocial Services  Follow up required by staff           Psychosocial Re-Evaluation:  Psychosocial Re-Evaluation    [place] Name 10/20/19 1248 11/18/19 0946           Psychosocial Re-Evaluation  Current issues with -- None Identified      Comments Patient's initial QOL score was 18.14 and her PHQ-9 score was 9. She has no diagnosis of depression. She says she feels depressed at times due to her inability to do her ADL's without SOB all due to [place]. She feels she is able to  manage these feelings through her family support. She says as she begins to feel better physically, her emotional state improves. She does take Alprazolam 0.5 mg prn for sleep. Will continue to monitor. Patient says she is feeling better emotionally because she feels better physically. Her episodes of feeling down and depressed are decreasing. She says he is feeling life doing more now and enjoys exercising. She continues to take Alprazolam 0.5 mg prn for sleep. Will continue to monitor.      Expected Outcomes Patient will have improved QOL and PHQ-9 scores at discharge with no psychosocial issues identified. Patient will have improved QOL and PHQ-9 scores at discharge with no psychosocial issues identified.      Interventions Encouraged to attend Pulmonary Rehabilitation for the exercise;Relaxation education;Stress management education Encouraged to attend Pulmonary Rehabilitation for the exercise;Relaxation education;Stress management education      Continue Psychosocial Services  Follow up required by staff No Follow up required             Psychosocial Discharge (Final Psychosocial Re-Evaluation):  Psychosocial Re-Evaluation - 11/18/19 0946      Psychosocial Re-Evaluation   Current issues with None Identified    Comments Patient says she is feeling better emotionally because she feels better physically. Her episodes of feeling down and depressed are decreasing. She says he is feeling life doing more now and enjoys exercising. She continues to take Alprazolam 0.5 mg prn for sleep. Will continue to monitor.    Expected Outcomes Patient will have improved QOL and PHQ-9 scores at discharge with no psychosocial issues identified.    Interventions Encouraged to attend Pulmonary Rehabilitation for the exercise;Relaxation education;Stress management education    Continue Psychosocial Services  No Follow up required            Education: Education Goals: Education classes will be provided on a  weekly basis, covering required topics. Participant will state understanding/return demonstration of topics presented.  Learning Barriers/Preferences:  Learning Barriers/Preferences - 09/23/19 1141      Learning Barriers/Preferences   Learning Barriers None    Learning Preferences Audio;Computer/Internet;Group Instruction;Individual Instruction;Pictoral;Skilled Demonstration;Verbal Instruction;Video;Written Material           Education Topics: How Lungs Work and Diseases: - Discuss the anatomy of the lungs and diseases that can affect the lungs, such as COPD.   Exercise: -Discuss the importance of exercise, FITT principles of exercise, normal and abnormal responses to exercise, and how to exercise safely.   Environmental Irritants: -Discuss types of environmental irritants and how to limit exposure to environmental irritants.   Meds/Inhalers and oxygen: - Discuss respiratory medications, definition of an inhaler and oxygen, and the proper way to use an inhaler and oxygen.   Energy Saving Techniques: - Discuss methods to conserve energy and decrease shortness of breath when performing activities of daily living.    Bronchial Hygiene / Breathing Techniques: - Discuss breathing mechanics, pursed-lip breathing technique,  proper posture, effective ways to clear airways, and other functional breathing techniques   Cleaning Equipment: - Provides group verbal and written instruction about the health risks of elevated stress, cause of high stress, and healthy ways to reduce stress.  PULMONARY REHAB OTHER RESPIRATORY from 11/05/2019 in [place]  Date 09/24/19  Educator Etheleen Mayhew  Instruction Review Code 1- Verbalizes Understanding      Nutrition I: Fats: - Discuss the types of cholesterol, what cholesterol does to the body, and how cholesterol levels can be controlled.   PULMONARY REHAB OTHER RESPIRATORY from 11/05/2019 in [place]  Date 10/01/19  Educator Etheleen Mayhew  Instruction Review Code 1- Verbalizes Understanding      Nutrition II: Labels: -Discuss the different components of food labels and how to read food labels.   PULMONARY REHAB OTHER RESPIRATORY from 11/05/2019 in [place]  Date 10/08/19  Educator D. Coad  Instruction Review Code 1- Verbalizes Understanding      Respiratory Infections: - Discuss the signs and symptoms of respiratory infections, ways to prevent respiratory infections, and the importance of seeking medical treatment when having a respiratory infection.   Stress I: Signs and Symptoms: - Discuss the causes of stress, how stress may lead to anxiety and depression, and ways to limit stress.   Stress II: Relaxation: -Discuss relaxation techniques to limit stress.   PULMONARY REHAB OTHER RESPIRATORY from 11/05/2019 in [place]  Date 10/29/19  Educator DF  Instruction Review Code 2- Demonstrated Understanding      Oxygen for Home/Travel: - Discuss how to prepare for travel when on oxygen and proper ways to transport and store oxygen to ensure safety.   PULMONARY REHAB OTHER RESPIRATORY from 11/05/2019 in [place]  Date 11/05/19  Educator [place]  Instruction Review Code 1- Verbalizes Understanding      Knowledge Questionnaire Score:  Knowledge Questionnaire Score - 09/23/19 1141      Knowledge Questionnaire Score   Pre Score 14/18           Core Components/Risk Factors/Patient Goals at Admission:  Personal Goals and Risk Factors at Admission - 09/23/19 1142      Core Components/Risk Factors/Patient Goals on Admission    Weight Management Yes    Intervention Weight Management/Obesity: Establish reasonable short term and long term weight goals.    Admit Weight 311 lb 4.8 oz (141.2 kg)    Goal Weight: Short Term 286 lb 4.8 oz (129.9 kg)    Goal Weight: Long Term 236 lb 4.8 oz (107.2 kg)     Expected Outcomes Short Term: Continue to assess and modify interventions until short term weight is achieved;Long Term: Adherence to nutrition and physical activity/exercise program aimed toward attainment of established weight goal    Personal Goal Other Yes    Personal Goal Get lungs stronger, Lose weight, get off oxygen, be healthier overall.    Intervention Attend class 2 x week and to supplement with at home exercise plan that was given 3 x week.    Expected Outcomes Reach expected goals.           Core Components/Risk Factors/Patient Goals Review:   Goals and Risk Factor Review    Row Name 10/20/19 1244 11/18/19 0947           Core Components/Risk Factors/Patient Goals Review   Personal Goals Review Weight Management/Obesity  Get stronger; be healthier. Weight Management/Obesity;Improve shortness of breath with ADL's  Get lungs stronger; be healthier.      Review Patient has completed 8 sessions gaining 2 bls since her initial visit. She is doing well in the program with consistent attendance. She says she is starting to feel stronger now  and has a little more energy. She feels her lungs are getting stronger. She enjoys getting out and have a routine exercise time. She feels like she is making progress toward meeting her personal goals. Will continue to monitor for progress. Patient has completed 16 sessions gaining 1 lb since last 30 day review. She continues to do well in the program with progression and consistent attendance. She continues to say she is feeling stronger and has more energy. She says her lungs are getting stronger and she continues to enjoy exercising. Will continue to monitor for progress.      Expected Outcomes Patient will continue to attend sessions and complete the program meeting both personal and program goals. Patient will continue to attend sessions and complete the program meeting both personal and program goals.             Core Components/Risk  Factors/Patient Goals at Discharge (Final Review):   Goals and Risk Factor Review - 11/18/19 0947      Core Components/Risk Factors/Patient Goals Review   Personal Goals Review Weight Management/Obesity;Improve shortness of breath with ADL's   Get lungs stronger; be healthier.   Review Patient has completed 16 sessions gaining 1 lb since last 30 day review. She continues to do well in the program with progression and consistent attendance. She continues to say she is feeling stronger and has more energy. She says her lungs are getting stronger and she continues to enjoy exercising. Will continue to monitor for progress.    Expected Outcomes Patient will continue to attend sessions and complete the program meeting both personal and program goals.           ITP Comments:  ITP Comments    Row Name 09/23/19 1131           ITP Comments Patient is being sent by her pulmonary doctor due to having COVID-19. she is eager to get started and get stronger. Her goals are to gain lung strength, come off oxygen, and just have better health.              Comments: ITP REVIEW Pt is making expected progress toward pulmonary rehab goals after completing 16 sessions. Recommend continued exercise, life style modification, education, and utilization of breathing techniques to increase stamina and strength and decrease shortness of breath with exertion.

## 2019-11-19 ENCOUNTER — Other Ambulatory Visit: Payer: Self-pay

## 2019-11-19 ENCOUNTER — Encounter (HOSPITAL_COMMUNITY)
Admission: RE | Admit: 2019-11-19 | Discharge: 2019-11-19 | Disposition: A | Discharge status: Home / Self Care | Payer: Commercial Managed Care - PPO | Source: Ambulatory Visit | Attending: Internal Medicine | Admitting: Internal Medicine

## 2019-11-19 VITALS — Wt 313.9 lb

## 2019-11-19 DIAGNOSIS — R0602 Shortness of breath: Secondary | ICD-10-CM

## 2019-11-19 NOTE — Progress Notes (Signed)
Daily Session Note  Patient Details  Name: NNENNA MEADOR MRN: [id number] Date of Birth: 02-19-1966 Referring Provider:     PULMONARY REHAB OTHER RESP ORIENTATION from 09/23/2019 in [place]  Referring Provider Dr. Shearon Stalls      Encounter Date: 11/19/2019  Check In:  Session Check In - 11/19/19 1445      Check-In   Supervising physician immediately available to respond to emergencies See telemetry face sheet for immediately available MD    Location [facility]    Staff Present Algis Downs, Exercise Physiologist;Hardin Hardenbrook Wynetta Emery, RN, BSN    Virtual Visit No    Medication changes reported     No    Fall or balance concerns reported    No    Tobacco Cessation No Change    Warm-up and Cool-down Performed as group-led instruction    Resistance Training Performed Yes    VAD Patient? No    PAD/SET Patient? No      Pain Assessment   Currently in Pain? No/denies    Pain Score 0-No pain    Multiple Pain Sites No           Capillary Blood Glucose: No results found for this or any previous visit (from the past 24 hour(s)).    Social History   Tobacco Use  Smoking Status Never Smoker  Smokeless Tobacco Never Used    Goals Met:  Proper associated with RPD/PD & O2 Sat Independence with exercise equipment Improved SOB with ADL's Using PLB without cueing & demonstrates good technique Exercise tolerated well No report of cardiac concerns or symptoms Strength training completed today  Goals Unmet:  Not Applicable  Comments: Check out 1545.   Dr. Kathie Dike is Medical Director for [facility] Pulmonary Rehab.

## 2019-11-24 ENCOUNTER — Other Ambulatory Visit: Payer: Self-pay

## 2019-11-24 ENCOUNTER — Encounter (HOSPITAL_COMMUNITY)
Admission: RE | Admit: 2019-11-24 | Discharge: 2019-11-24 | Disposition: A | Discharge status: Home / Self Care | Payer: Commercial Managed Care - PPO | Source: Ambulatory Visit | Attending: Internal Medicine | Admitting: Internal Medicine

## 2019-11-24 DIAGNOSIS — R0602 Shortness of breath: Secondary | ICD-10-CM

## 2019-11-24 NOTE — Progress Notes (Signed)
Daily Session Note  Patient Details  Name: Gina Conner MRN: [id number] Date of Birth: Mar 27, 1966 Referring Provider:     PULMONARY REHAB OTHER RESP ORIENTATION from 09/23/2019 in [place]  Referring Provider Dr. Shearon Stalls      Encounter Date: 11/24/2019  Check In:  Session Check In - 11/24/19 1445      Check-In   Supervising physician immediately available to respond to emergencies See telemetry face sheet for immediately available MD    [facility]    Staff Present Geanie Cooley, RN;Vaniece Hatchett, Exercise Physiologist;Annedrea Rosezella [place], RN, [place]    Virtual Visit No    Medication changes reported     No    Fall or balance concerns reported    No    Tobacco Cessation No Change    Warm-up and Cool-down Performed as group-led instruction    Resistance Training Performed Yes    VAD Patient? No    PAD/SET Patient? No      Pain Assessment   Currently in Pain? No/denies    Pain Score 0-No pain    Multiple Pain Sites No           Capillary Blood Glucose: No results found for this or any previous visit (from the past 24 hour(s)).    Social History   Tobacco Use  Smoking Status Never Smoker  Smokeless Tobacco Never Used    Goals Met:  Proper associated with RPD/PD & O2 Sat Independence with exercise equipment Improved SOB with ADL's Using PLB without cueing & demonstrates good technique Exercise tolerated well Personal goals reviewed No report of cardiac concerns or symptoms Strength training completed today  Goals Unmet:  Not Applicable  Comments: check out    Dr. Kathie Dike is Medical Director for [facility] Pulmonary Rehab.

## 2019-11-24 NOTE — Progress Notes (Signed)
Daily Session Note  Patient Details  Name: Gina Conner MRN: [id number] Date of Birth: 1965/09/16 Referring Provider:     PULMONARY REHAB OTHER RESP ORIENTATION from 09/23/2019 in [place]  Referring Provider Dr. Shearon Stalls      Encounter Date: 11/24/2019  Check In:  Session Check In - 11/24/19 1445      Check-In   Supervising physician immediately available to respond to emergencies See telemetry face sheet for immediately available MD    [facility]    Staff Present Geanie Cooley, RN;Vaniece Hatchett, Exercise Physiologist;Annedrea Rosezella [place], RN, [place]    Virtual Visit No    Medication changes reported     No    Fall or balance concerns reported    No    Tobacco Cessation No Change    Warm-up and Cool-down Performed as group-led instruction    Resistance Training Performed Yes    VAD Patient? No    PAD/SET Patient? No      Pain Assessment   Currently in Pain? No/denies    Pain Score 0-No pain    Multiple Pain Sites No           Capillary Blood Glucose: No results found for this or any previous visit (from the past 24 hour(s)).    Social History   Tobacco Use  Smoking Status Never Smoker  Smokeless Tobacco Never Used    Goals Met:  Proper associated with RPD/PD & O2 Sat Independence with exercise equipment Improved SOB with ADL's Using PLB without cueing & demonstrates good technique Exercise tolerated well Personal goals reviewed No report of cardiac concerns or symptoms Strength training completed today  Goals Unmet:  Not Applicable  Comments: check out 3:45   Dr. Kathie Dike is Medical Director for [facility] Pulmonary Rehab.

## 2019-11-26 ENCOUNTER — Encounter (HOSPITAL_COMMUNITY)
Admission: RE | Admit: 2019-11-26 | Discharge: 2019-11-26 | Disposition: A | Discharge status: Home / Self Care | Payer: Commercial Managed Care - PPO | Source: Ambulatory Visit | Attending: Internal Medicine | Admitting: Internal Medicine

## 2019-11-26 ENCOUNTER — Other Ambulatory Visit: Payer: Self-pay

## 2019-11-26 DIAGNOSIS — R0602 Shortness of breath: Secondary | ICD-10-CM | POA: Diagnosis not present

## 2019-11-26 NOTE — Progress Notes (Signed)
Daily Session Note  Patient Details  Name: KALANA YUST MRN: [id number] Date of Birth: [birth year] Referring Provider:     PULMONARY REHAB OTHER RESP ORIENTATION from 09/23/2019 in [place]  Referring Provider Dr. Shearon Stalls      Encounter Date: 11/26/2019  Check In:  Session Check In - 11/26/19 1445      Check-In   Supervising physician immediately available to respond to emergencies See telemetry face sheet for immediately available MD    Location [facility]    Staff Present Geanie Cooley, RN;Vaniece Hatchett, Exercise Physiologist;Jshon Ibe Wynetta Emery, RN, BSN    Virtual Visit No    Medication changes reported     No    Fall or balance concerns reported    No    Tobacco Cessation No Change    Warm-up and Cool-down Performed as group-led instruction    Resistance Training Performed Yes    VAD Patient? No    PAD/SET Patient? No      Pain Assessment   Currently in Pain? No/denies    Pain Score 0-No pain    Multiple Pain Sites No           Capillary Blood Glucose: No results found for this or any previous visit (from the past 24 hour(s)).    Social History   Tobacco Use  Smoking Status Never Smoker  Smokeless Tobacco Never Used    Goals Met:  Proper associated with RPD/PD & O2 Sat Independence with exercise equipment Improved SOB with ADL's Using PLB without cueing & demonstrates good technique Exercise tolerated well No report of cardiac concerns or symptoms Strength training completed today  Goals Unmet:  Not Applicable  Comments: Check out 1545.   Dr. Kathie Dike is Medical Director for [facility] Pulmonary Rehab.

## 2019-12-01 ENCOUNTER — Encounter (HOSPITAL_COMMUNITY)
Admission: RE | Admit: 2019-12-01 | Discharge: 2019-12-01 | Disposition: A | Discharge status: Home / Self Care | Payer: Commercial Managed Care - PPO | Source: Ambulatory Visit | Attending: Internal Medicine | Admitting: Internal Medicine

## 2019-12-01 ENCOUNTER — Other Ambulatory Visit: Payer: Self-pay

## 2019-12-01 DIAGNOSIS — R0602 Shortness of breath: Secondary | ICD-10-CM | POA: Diagnosis not present

## 2019-12-01 NOTE — Progress Notes (Signed)
Daily Session Note  Patient Details  Name: Gina Conner MRN: [id number] Date of Birth: 02/14/1966 Referring Provider:     PULMONARY REHAB OTHER RESP ORIENTATION from 09/23/2019 in [place] [facility]  Referring Provider Dr. Desai      Encounter Date: 12/01/2019  Check In:  Session Check In - 12/01/19 1045      Check-In   Supervising physician immediately available to respond to emergencies See telemetry face sheet for immediately available MD    Location [facility]    Staff Present Vaniece Hatchett, Exercise Physiologist;Annedrea Stackhouse, RN, MHA    Virtual Visit No    Medication changes reported     No    Fall or balance concerns reported    No    Tobacco Cessation No Change    Warm-up and Cool-down Performed as group-led instruction    Resistance Training Performed Yes    VAD Patient? No    PAD/SET Patient? No      Pain Assessment   Currently in Pain? No/denies    Pain Score 0-No pain    Multiple Pain Sites No           Capillary Blood Glucose: No results found for this or any previous visit (from the past 24 hour(s)).    Social History   Tobacco Use  Smoking Status Never Smoker  Smokeless Tobacco Never Used    Goals Met:  Proper associated with RPD/PD & O2 Sat Independence with exercise equipment Improved SOB with ADL's Using PLB without cueing & demonstrates good technique No report of cardiac concerns or symptoms Strength training completed today  Goals Unmet:  Not Applicable  Comments: session 10:45 - 11:45   Dr. Jehanzeb Memon is Medical Director for [place] [facility]. 

## 2019-12-03 ENCOUNTER — Encounter (HOSPITAL_COMMUNITY)
Admission: RE | Admit: 2019-12-03 | Discharge: 2019-12-03 | Disposition: A | Discharge status: Home / Self Care | Payer: Commercial Managed Care - PPO | Source: Ambulatory Visit | Attending: Internal Medicine | Admitting: Internal Medicine

## 2019-12-03 ENCOUNTER — Other Ambulatory Visit: Payer: Self-pay

## 2019-12-03 VITALS — Wt 315.3 lb

## 2019-12-03 DIAGNOSIS — R0602 Shortness of breath: Secondary | ICD-10-CM | POA: Diagnosis not present

## 2019-12-03 NOTE — Progress Notes (Signed)
Daily Session Note  Patient Details  Name: CASAUNDRA TAKACS MRN: [id number] Date of Birth: Jul 19, 1965 Referring Provider:     PULMONARY REHAB OTHER RESP ORIENTATION from 09/23/2019 in [place]  Referring Provider Dr. Shearon Stalls      Encounter Date: 12/03/2019  Check In:  Session Check In - 12/03/19 1050      Check-In   Supervising physician immediately available to respond to emergencies See telemetry face sheet for immediately available MD    Location [facility]    Staff Present Ramon Dredge, RN, MHA;Vaniece Hatchett, Exercise Physiologist    Virtual Visit No    Medication changes reported     No    Fall or balance concerns reported    No    Tobacco Cessation No Change    Warm-up and Cool-down Performed as group-led instruction    Resistance Training Performed Yes    VAD Patient? No    PAD/SET Patient? No      Pain Assessment   Currently in Pain? No/denies    Pain Score 0-No pain    Multiple Pain Sites No           Capillary Blood Glucose: No results found for this or any previous visit (from the past 24 hour(s)).    Social History   Tobacco Use  Smoking Status Never Smoker  Smokeless Tobacco Never Used    Goals Met:  Proper associated with RPD/PD & O2 Sat Independence with exercise equipment Using PLB without cueing & demonstrates good technique No report of cardiac concerns or symptoms Strength training completed today  Goals Unmet:  Not Applicable  Comments: session 10:45-11:45   Dr. Kathie Dike is Medical Director for [facility] Pulmonary Rehab.

## 2019-12-08 ENCOUNTER — Encounter (HOSPITAL_COMMUNITY): Payer: Commercial Managed Care - PPO

## 2019-12-10 ENCOUNTER — Other Ambulatory Visit: Payer: Self-pay

## 2019-12-10 ENCOUNTER — Encounter (HOSPITAL_COMMUNITY)
Admission: RE | Admit: 2019-12-10 | Discharge: 2019-12-10 | Disposition: A | Discharge status: Home / Self Care | Payer: Commercial Managed Care - PPO | Source: Ambulatory Visit | Attending: Internal Medicine | Admitting: Internal Medicine

## 2019-12-10 DIAGNOSIS — R0602 Shortness of breath: Secondary | ICD-10-CM

## 2019-12-10 NOTE — Progress Notes (Signed)
Daily Session Note  Patient Details  Name: Gina Conner MRN: [id number] Date of Birth: 1965/11/27 Referring Provider:     PULMONARY REHAB OTHER RESP ORIENTATION from 09/23/2019 in [place]  Referring Provider Dr. Shearon Stalls      Encounter Date: 12/10/2019  Check In:  Session Check In - 12/10/19 1048      Check-In   Supervising physician immediately available to respond to emergencies See telemetry face sheet for immediately available MD    Location [facility]    Staff Present Algis Downs, Exercise Physiologist;Afton Lavalle Rosezella [place], RN,     Virtual Visit No    Medication changes reported     No    Fall or balance concerns reported    No    Tobacco Cessation No Change    Warm-up and Cool-down Performed as group-led instruction    Resistance Training Performed Yes    VAD Patient? No    PAD/SET Patient? No      Pain Assessment   Currently in Pain? No/denies    Multiple Pain Sites No           Capillary Blood Glucose: No results found for this or any previous visit (from the past 24 hour(s)).    Social History   Tobacco Use  Smoking Status Never Smoker  Smokeless Tobacco Never Used    Goals Met:  Proper associated with RPD/PD & O2 Sat Independence with exercise equipment Improved SOB with ADL's Using PLB without cueing & demonstrates good technique Exercise tolerated well No report of cardiac concerns or symptoms Strength training completed today  Goals Unmet:  Not Applicable  Comments: [phone]   Dr. Kathie Dike is Medical Director for [facility] Pulmonary Rehab.

## 2019-12-15 ENCOUNTER — Encounter (HOSPITAL_COMMUNITY)
Admission: RE | Admit: 2019-12-15 | Discharge: 2019-12-15 | Disposition: A | Discharge status: Home / Self Care | Payer: Commercial Managed Care - PPO | Source: Ambulatory Visit | Attending: Internal Medicine | Admitting: Internal Medicine

## 2019-12-15 DIAGNOSIS — R0602 Shortness of breath: Secondary | ICD-10-CM | POA: Insufficient documentation

## 2019-12-15 NOTE — Progress Notes (Signed)
Daily Session Note  Patient Details  Name: Gina Conner MRN: [id number] Date of Birth: 09/28/1965 Referring Provider:     PULMONARY REHAB OTHER RESP ORIENTATION from 09/23/2019 in [place]  Referring Provider Dr. Shearon Stalls      Encounter Date: 12/15/2019  Check In:  Session Check In - 12/15/19 1048      Check-In   Supervising physician immediately available to respond to emergencies See telemetry face sheet for immediately available MD    Location [facility]    Staff Present Ramon Dredge, RN, MHA;Vaniece Hatchett, Exercise Physiologist    Tobacco Cessation No Change    Warm-up and Cool-down Performed as group-led instruction    Resistance Training Performed Yes    VAD Patient? No    PAD/SET Patient? No      Pain Assessment   Currently in Pain? No/denies    Pain Score 0-No pain    Multiple Pain Sites No           Capillary Blood Glucose: No results found for this or any previous visit (from the past 24 hour(s)).    Social History   Tobacco Use  Smoking Status Never Smoker  Smokeless Tobacco Never Used    Goals Met:  Proper associated with RPD/PD & O2 Sat Independence with exercise equipment Improved SOB with ADL's Using PLB without cueing & demonstrates good technique Exercise tolerated well No report of cardiac concerns or symptoms Strength training completed today  Goals Unmet:  Not Applicable  Comments: [phone]   Dr. Kathie Dike is Medical Director for [facility] Pulmonary Rehab.

## 2019-12-16 NOTE — Progress Notes (Signed)
Pulmonary Individual Treatment Plan  Patient Details  Name: Gina Conner MRN: [id number] Date of Birth: 03-18-1966 Referring Provider:     PULMONARY REHAB OTHER RESP ORIENTATION from 09/23/2019 in [place]  Referring Provider Dr. Shearon Stalls      Initial Encounter Date:    [facility] from 09/23/2019 in [place]  Date 09/23/19      Visit Diagnosis: SOB (shortness of breath)  Patient's Home Medications on Admission:   Current Outpatient Medications:  .  acetaminophen (TYLENOL) 500 MG tablet, Take 1,000 mg by mouth every 6 (six) hours as needed for mild pain or headache. , Disp: , Rfl:  .  albuterol (VENTOLIN HFA) 108 (90 Base) MCG/ACT inhaler, Inhale 2 puffs into the lungs every 6 (six) hours as needed for wheezing or shortness of breath., Disp: 6.7 g, Rfl: 0 .  ALPRAZolam (XANAX) 0.5 MG tablet, Take 0.5 mg by mouth at bedtime as needed for sleep., Disp: , Rfl:  .  Cholecalciferol (D 5000) 5000 units capsule, Take 5,000 Units by mouth daily., Disp: , Rfl:  .  ipratropium-albuterol (DUONEB) 0.5-2.5 (3) MG/3ML SOLN, Take 3 mLs by nebulization every 6 (six) hours as needed (shortness of breath)., Disp: 360 mL, Rfl: 3 .  levothyroxine (SYNTHROID) 88 MCG tablet, Take 88 mcg by mouth daily before breakfast. Pt unsure of strength. , Disp: , Rfl:  .  spironolactone (ALDACTONE) 25 MG tablet, Take 25 mg by mouth at bedtime., Disp: , Rfl:   Past Medical History: Past Medical History:  Diagnosis Date  . DVT (deep venous thrombosis) ([place])    provoked during tendon surgery was treated 3 months of coumadin  . Hypothyroidism     Tobacco Use: Social History   Tobacco Use  Smoking Status Never Smoker  Smokeless Tobacco Never Used    Labs: Recent Review Flowsheet Data    Labs for ITP Cardiac and Pulmonary Rehab Latest Ref Rng & Units 05/18/2019 05/22/2019 05/23/2019   Cholestrol 0 - 200 mg/dL - - 121   LDLCALC 0 - 99  mg/dL - - 68   HDL >40 mg/dL - - 31(L)   Trlycerides <150 mg/dL 126 - 108   Hemoglobin A1c 4.8 - 5.6 % - 6.2(H) -      Capillary Blood Glucose: No results found for: GLUCAP   Pulmonary Assessment Scores:  Pulmonary Assessment Scores    Row Name 09/23/19 1137         ADL UCSD   ADL Phase Entry     SOB Score total 53     Rest 0     Walk 10     Stairs 4     Bath 2     Dress 2     Shop 3       CAT Score   CAT Score 20       mMRC Score   mMRC Score 3           UCSD: Self-administered rating of dyspnea associated with activities of daily living (ADLs) 6-point scale (0 = "not at all" to 5 = "maximal or unable to do because of breathlessness")  Scoring Scores range from 0 to 120.  Minimally important difference is 5 units  CAT: CAT can identify the health impairment of COPD patients and is better correlated with disease progression.  CAT has a scoring range of zero to 40. The CAT score is classified into four groups of low (less than 10), medium (  10 - 20), high (21-30) and very high (31-40) based on the impact level of disease on health status. A CAT score over 10 suggests significant symptoms.  A worsening CAT score could be explained by an exacerbation, poor medication adherence, poor inhaler technique, or progression of COPD or comorbid conditions.  CAT MCID is 2 points  mMRC: mMRC (Modified Medical Research Council) Dyspnea Scale is used to assess the degree of baseline functional disability in patients of respiratory disease due to dyspnea. No minimal important difference is established. A decrease in score of 1 point or greater is considered a positive change.   Pulmonary Function Assessment:   Exercise Target Goals: Exercise Program Goal: Individual exercise prescription set using results from initial 6 min walk test and THRR while considering  patient's activity barriers and safety.   Exercise Prescription Goal: Initial exercise prescription builds to 30-45  minutes a day of aerobic activity, 2-3 days per week.  Home exercise guidelines will be given to patient during program as part of exercise prescription that the participant will acknowledge.  Activity Barriers & Risk Stratification:  Activity Barriers & Cardiac Risk Stratification - 09/23/19 1142      Activity Barriers & Cardiac Risk Stratification   Activity Barriers Deconditioning;Shortness of Breath    Cardiac Risk Stratification Low           6 Minute Walk:  6 Minute Walk    Row Name 09/23/19 1139         6 Minute Walk   Phase Initial     Distance 1200 feet     Walk Time 6 minutes     # of Rest Breaks 0     MPH 2.27     METS 2.74     RPE 13     Perceived Dyspnea  13     VO2 Peak 9.44     Symptoms No     Resting HR 76 bpm     Resting BP 110/80     Resting Oxygen Saturation  97 %     Exercise Oxygen Saturation  during 6 min walk 94 %     Max Ex. HR 110 bpm     Max Ex. BP 146/106     2 Minute Post BP 110/98            Oxygen Initial Assessment:  Oxygen Initial Assessment - 09/23/19 1135      Home Oxygen   Home Oxygen Device E-Tanks    Sleep Oxygen Prescription None    Home Exercise Oxygen Prescription Continuous    Liters per minute 1    Home at Rest Exercise Oxygen Prescription None    Compliance with Home Oxygen Use Yes      Initial 6 min Walk   Oxygen Used None      Program Oxygen Prescription   Program Oxygen Prescription Continuous   As needed   Liters per minute 2      Intervention   Short Term Goals To learn and exhibit compliance with exercise, home and travel O2 prescription;To learn and understand importance of maintaining oxygen saturations>88%;To learn and demonstrate proper pursed lip breathing techniques or other breathing techniques.    Long  Term Goals Exhibits compliance with exercise, home and travel O2 prescription;Maintenance of O2 saturations>88%           Oxygen Re-Evaluation:  Oxygen Re-Evaluation    Row Name 10/20/19  1241 11/18/19 0944 12/16/19 1424  Program Oxygen Prescription   Program Oxygen Prescription None None None       Home Oxygen   Home Oxygen Device None None None     Sleep Oxygen Prescription None None None     Home Exercise Oxygen Prescription None None None     Home at Rest Exercise Oxygen Prescription None None None     Compliance with Home Oxygen Use Yes Yes Yes       Goals/Expected Outcomes   Short Term Goals To learn and exhibit compliance with exercise, home and travel O2 prescription;To learn and understand importance of maintaining oxygen saturations>88%;To learn and demonstrate proper pursed lip breathing techniques or other breathing techniques. To learn and exhibit compliance with exercise, home and travel O2 prescription;To learn and understand importance of maintaining oxygen saturations>88%;To learn and demonstrate proper pursed lip breathing techniques or other breathing techniques. To learn and exhibit compliance with exercise, home and travel O2 prescription;To learn and understand importance of maintaining oxygen saturations>88%;To learn and demonstrate proper pursed lip breathing techniques or other breathing techniques.     Long  Term Goals Exhibits compliance with exercise, home and travel O2 prescription;Maintenance of O2 saturations>88% Exhibits compliance with exercise, home and travel O2 prescription;Maintenance of O2 saturations>88% Exhibits compliance with exercise, home and travel O2 prescription;Maintenance of O2 saturations>88%     Comments Patient is meeting her expected goals and outcomes both long and short term. Will continue to monitor for progress. Patient is meeting her expected goals and outcomes both long and short term. Will continue to monitor for progress. Patient is meeting her expected goals and outcomes both long and short term. Will continue to monitor for progress.     Goals/Expected Outcomes Patient will continue to meet her expected  goals and outcomes. Patient will continue to meet her expected goals and outcomes. Patient will continue to meet her expected goals and outcomes.            Oxygen Discharge (Final Oxygen Re-Evaluation):  Oxygen Re-Evaluation - 12/16/19 1424      Program Oxygen Prescription   Program Oxygen Prescription None      Home Oxygen   Home Oxygen Device None    Sleep Oxygen Prescription None    Home Exercise Oxygen Prescription None    Home at Rest Exercise Oxygen Prescription None    Compliance with Home Oxygen Use Yes      Goals/Expected Outcomes   Short Term Goals To learn and exhibit compliance with exercise, home and travel O2 prescription;To learn and understand importance of maintaining oxygen saturations>88%;To learn and demonstrate proper pursed lip breathing techniques or other breathing techniques.    Long  Term Goals Exhibits compliance with exercise, home and travel O2 prescription;Maintenance of O2 saturations>88%    Comments Patient is meeting her expected goals and outcomes both long and short term. Will continue to monitor for progress.    Goals/Expected Outcomes Patient will continue to meet her expected goals and outcomes.           Initial Exercise Prescription:  Initial Exercise Prescription - 09/23/19 1100      Date of Initial Exercise RX and Referring Provider   Date 09/23/19    Referring Provider Dr. Shearon Stalls    Expected Discharge Date 12/24/19      Treadmill   MPH 1.2    Grade 0    Minutes 17    METs 1.91      T5 Nustep   Level 1    SPM  73    Minutes 22    METs 1.7      Prescription Details   Frequency (times per week) 2    Duration Progress to 30 minutes of continuous aerobic without signs/symptoms of physical distress      Intensity   Ratings of Perceived Exertion 11-13    Perceived Dyspnea 0-4      Progression   Progression Continue progressive overload as per policy without signs/symptoms or physical distress.      Resistance Training    Training Prescription Yes    Weight 1    Reps 10-15           Perform Capillary Blood Glucose checks as needed.  Exercise Prescription Changes:   Exercise Prescription Changes    Row Name 10/09/19 1500 10/26/19 1400 11/09/19 1300 11/24/19 1000 12/03/19 1152     Response to Exercise   Blood Pressure (Admit) 122/88 138/70 120/82 120/72 128/80   Blood Pressure (Exercise) 142/86 152/88 118/80 148/78 136/84   Blood Pressure (Exit) 122/84 112/82 120/70 124/80 124/84   Heart Rate (Admit) 76 bpm 71 bpm 81 bpm 77 bpm 87 bpm   Heart Rate (Exercise) 97 bpm 99 bpm 102 bpm 107 bpm 99 bpm   Heart Rate (Exit) 81 bpm 84 bpm 90 bpm 85 bpm 84 bpm   Oxygen Saturation (Admit) 97 % 98 % 95 % 95 % 95 %   Oxygen Saturation (Exercise) 93 % 95 % 95 % 94 % 92 %   Oxygen Saturation (Exit) 96 % 92 % 97 % 98 % 94 %   Rating of Perceived Exertion (Exercise) 11 11 11 11 11    Perceived Dyspnea (Exercise) 11 11 11 11 11    Symptoms none -- -- -- --   Duration Continue with 30 min of aerobic exercise without signs/symptoms of physical distress. Continue with 30 min of aerobic exercise without signs/symptoms of physical distress. Continue with 30 min of aerobic exercise without signs/symptoms of physical distress. Continue with 30 min of aerobic exercise without signs/symptoms of physical distress. Continue with 30 min of aerobic exercise without signs/symptoms of physical distress.   Intensity THRR unchanged THRR unchanged THRR unchanged THRR unchanged THRR unchanged     Progression   Progression Continue to progress workloads to maintain intensity without signs/symptoms of physical distress. Continue to progress workloads to maintain intensity without signs/symptoms of physical distress. Continue to progress workloads to maintain intensity without signs/symptoms of physical distress. Continue to progress workloads to maintain intensity without signs/symptoms of physical distress. Continue to progress workloads to  maintain intensity without signs/symptoms of physical distress.   Average METs 2.15 -- -- -- --     Resistance Training   Training Prescription Yes Yes Yes Yes Yes   Weight 2 2 2 3 3    Reps 10-15 10-15 10-15 10-15 10-15   Time 10 Minutes -- -- -- --     Oxygen   Liters 0 -- -- -- --     Treadmill   MPH 1.3 1.4 1.6 1.8 2   Grade 0 0 0 0 0   Minutes 17 17 17 17 17    METs 1.99 1.99 2.22 2.37 2.53     T5 Nustep   Level 1 4 2 2 2    SPM 105 118 126 126 139   Minutes 22 22 22 22 22    METs 2.4 2.5 2.3 3.5 3.4     Home Exercise Plan   Plans to continue exercise at Home (comment) -- -- -- --  Frequency Add 3 additional days to program exercise sessions. -- -- -- --   Initial Home Exercises Provided 09/23/19 -- -- -- --          Exercise Comments:   Exercise Comments    Row Name 09/23/19 1153 10/09/19 1525         Exercise Comments Patient preformed well just got of little short of breath. Patient is looking foward to starting the program. Patient has been here 6 sessions so far. Patient is preforming well and very motivated to reach her goals. We will continue to progress as tolerated.             Exercise Goals and Review:   Exercise Goals    Row Name 09/23/19 1151 12/15/19 1247           Exercise Goals   Increase Physical Activity Yes Yes      Intervention Provide advice, education, support and counseling about physical activity/exercise needs.;Develop an individualized exercise prescription for aerobic and resistive training based on initial evaluation findings, risk stratification, comorbidities and participant's personal goals. Provide advice, education, support and counseling about physical activity/exercise needs.;Develop an individualized exercise prescription for aerobic and resistive training based on initial evaluation findings, risk stratification, comorbidities and participant's personal goals.      Expected Outcomes Short Term: Attend rehab on a regular  basis to increase amount of physical activity.;Long Term: Add in home exercise to make exercise part of routine and to increase amount of physical activity. Short Term: Attend rehab on a regular basis to increase amount of physical activity.;Long Term: Add in home exercise to make exercise part of routine and to increase amount of physical activity.;Long Term: Exercising regularly at least 3-5 days a week.      Increase Strength and Stamina Yes Yes      Intervention Provide advice, education, support and counseling about physical activity/exercise needs.;Develop an individualized exercise prescription for aerobic and resistive training based on initial evaluation findings, risk stratification, comorbidities and participant's personal goals. Provide advice, education, support and counseling about physical activity/exercise needs.;Develop an individualized exercise prescription for aerobic and resistive training based on initial evaluation findings, risk stratification, comorbidities and participant's personal goals.      Expected Outcomes Long Term: Improve cardiorespiratory fitness, muscular endurance and strength as measured by increased METs and functional capacity (6MWT);Short Term: Perform resistance training exercises routinely during rehab and add in resistance training at home Short Term: Increase workloads from initial exercise prescription for resistance, speed, and METs.;Short Term: Perform resistance training exercises routinely during rehab and add in resistance training at home      Able to understand and use rate of perceived exertion (RPE) scale Yes Yes      Intervention Provide education and explanation on how to use RPE scale Provide education and explanation on how to use RPE scale      Expected Outcomes Short Term: Able to use RPE daily in rehab to express subjective intensity level;Long Term:  Able to use RPE to guide intensity level when exercising independently Short Term: Able to use RPE  daily in rehab to express subjective intensity level;Long Term:  Able to use RPE to guide intensity level when exercising independently      Able to understand and use Dyspnea scale Yes Yes      Intervention Provide education and explanation on how to use Dyspnea scale Provide education and explanation on how to use Dyspnea scale      Expected Outcomes Short  Term: Able to use Dyspnea scale daily in rehab to express subjective sense of shortness of breath during exertion;Long Term: Able to use Dyspnea scale to guide intensity level when exercising independently Short Term: Able to use Dyspnea scale daily in rehab to express subjective sense of shortness of breath during exertion;Long Term: Able to use Dyspnea scale to guide intensity level when exercising independently      Knowledge and understanding of Target Heart Rate Range (THRR) Yes Yes      Intervention Provide education and explanation of THRR including how the numbers were predicted and where they are located for reference Provide education and explanation of THRR including how the numbers were predicted and where they are located for reference      Expected Outcomes Long Term: Able to use THRR to govern intensity when exercising independently;Short Term: Able to state/look up THRR Short Term: Able to state/look up THRR;Long Term: Able to use THRR to govern intensity when exercising independently;Short Term: Able to use daily as guideline for intensity in rehab      Able to check pulse independently Yes --      Intervention Provide education and demonstration on how to check pulse in carotid and radial arteries.;Review the importance of being able to check your own pulse for safety during independent exercise --      Expected Outcomes Short Term: Able to explain why pulse checking is important during independent exercise;Long Term: Able to check pulse independently and accurately --      Understanding of Exercise Prescription Yes Yes       Intervention Provide education, explanation, and written materials on patient's individual exercise prescription Provide education, explanation, and written materials on patient's individual exercise prescription      Expected Outcomes Short Term: Able to explain program exercise prescription;Long Term: Able to explain home exercise prescription to exercise independently Short Term: Able to explain program exercise prescription;Long Term: Able to explain home exercise prescription to exercise independently             Exercise Goals Re-Evaluation :  Exercise Goals Re-Evaluation    Row Name 10/12/19 1252 11/13/19 1451 12/15/19 1249         Exercise Goal Re-Evaluation   Exercise Goals Review Increase Physical Activity;Increase Strength and Stamina Increase Physical Activity;Increase Strength and Stamina Increase Physical Activity;Increase Strength and Stamina;Able to understand and use rate of perceived exertion (RPE) scale;Able to understand and use Dyspnea scale;Knowledge and understanding of Target Heart Rate Range (THRR);Understanding of Exercise Prescription;Improve claudication pain tolerance and improve walking ability     Comments Patients goals are to get lungs stronger, be healthier and to lose 50lbs. Patient feels that she is getting stronger with each visit. She says she is already able to do more at home and while shopping. Patient is not losing any weight. She has gained 2lbs. We will talk to her about her weight lose goals and to discuss her diet. We will continue to monitor her progress. She is breathing and feeling better so far.The program has motivated her to lose weight and live a better lifestyle. She also feels better about exercising on her own since her oxygen levls have improved. She tolerates increased workloads well. She works hard towards her goals each session. Patient said that she feels good and heathier. She is still working on the weight loss. She is exercising at home  and has changed her diet. She pushes herself and others in class to reach their goals. She has  a steady progression so far. She mention still feeling some tightness in her throat and will adress that with her doctor. We will continue to progress as tolerated.     Expected Outcomes To reach her expected goals. To reach expected goals To reach expected goals            Discharge Exercise Prescription (Final Exercise Prescription Changes):  Exercise Prescription Changes - 12/03/19 1152      Response to Exercise   Blood Pressure (Admit) 128/80    Blood Pressure (Exercise) 136/84    Blood Pressure (Exit) 124/84    Heart Rate (Admit) 87 bpm    Heart Rate (Exercise) 99 bpm    Heart Rate (Exit) 84 bpm    Oxygen Saturation (Admit) 95 %    Oxygen Saturation (Exercise) 92 %    Oxygen Saturation (Exit) 94 %    Rating of Perceived Exertion (Exercise) 11    Perceived Dyspnea (Exercise) 11    Duration Continue with 30 min of aerobic exercise without signs/symptoms of physical distress.    Intensity THRR unchanged      Progression   Progression Continue to progress workloads to maintain intensity without signs/symptoms of physical distress.      Resistance Training   Training Prescription Yes    Weight 3    Reps 10-15      Treadmill   MPH 2    Grade 0    Minutes 17    METs 2.53      T5 Nustep   Level 2    SPM 139    Minutes 22    METs 3.4           Nutrition:  Target Goals: Understanding of nutrition guidelines, daily intake of sodium 1500mg , cholesterol 200mg , calories 30% from fat and 7% or less from saturated fats, daily to have 5 or more servings of fruits and vegetables.  Biometrics:  Pre Biometrics - 09/23/19 1156      Pre Biometrics   Height 5\' 7"  (1.702 m)    Weight 141.2 kg    Waist Circumference 55 inches    Hip Circumference 60 inches    Waist to Hip Ratio 0.92 %    BMI (Calculated) 48.75    Triceps Skinfold 15 mm    % Body Fat 52.3 %    Grip Strength  25.53 kg    Flexibility 14.33 in    Single Leg Stand 9.29 seconds            Nutrition Therapy Plan and Nutrition Goals:  Nutrition Therapy & Goals - 12/16/19 1427      Personal Nutrition Goals   Comments Patient continues to say she trys to heart healthy. Will continue to monitor.      Intervention Plan   Intervention Nutrition handout(s) given to patient.           Nutrition Assessments:  Nutrition Assessments - 09/23/19 1141      MEDFICTS Scores   Pre Score 35           Nutrition Goals Re-Evaluation:   Nutrition Goals Discharge (Final Nutrition Goals Re-Evaluation):   Psychosocial: Target Goals: Acknowledge presence or absence of significant depression and/or stress, maximize coping skills, provide positive support system. Participant is able to verbalize types and ability to use techniques and skills needed for reducing stress and depression.  Initial Review & Psychosocial Screening:  Initial Psych Review & Screening - 09/23/19 1138      Initial Review  Current issues with Current Depression   All due to having COVID-19 and not being able to do ADL's     [place]? Yes      Barriers   Psychosocial barriers to participate in program The patient should benefit from training in stress management and relaxation.      Screening Interventions   Interventions Encouraged to exercise    Expected Outcomes Short Term goal: Identification and review with participant of any Quality of Life or Depression concerns found by scoring the questionnaire.;Long Term goal: The participant improves quality of Life and PHQ9 Scores as seen by post scores and/or verbalization of changes           Quality of Life Scores:  Quality of Life - 09/23/19 1200      Quality of Life   Select Quality of Life      Quality of Life Scores   Health/Function Pre 13.31 %    Socioeconomic Pre 25.5 %    Psych/Spiritual Pre 15.43 %    Family Pre 30 %     GLOBAL Pre 18.14 %          Scores of 19 and below usually indicate a poorer quality of life in these areas.  A difference of  2-3 points is a clinically meaningful difference.  A difference of 2-3 points in the total score of the Quality of Life Index has been associated with significant improvement in overall quality of life, self-image, physical symptoms, and general health in studies assessing change in quality of life.   PHQ-9: Recent Review Flowsheet Data    Depression screen [facility] 2/9 09/23/2019   Decreased Interest 0   Down, Depressed, Hopeless 1   PHQ - 2 Score 1   Altered sleeping 2   Tired, decreased energy 3   Change in appetite 1   Feeling bad or failure about yourself  1   Trouble concentrating 1   Moving slowly or fidgety/restless 0   Suicidal thoughts 0   PHQ-9 Score 9   Difficult doing work/chores Very difficult     Interpretation of Total Score  Total Score Depression Severity:  1-4 = Minimal depression, 5-9 = Mild depression, 10-14 = Moderate depression, 15-19 = Moderately severe depression, 20-27 = Severe depression   Psychosocial Evaluation and Intervention:  Psychosocial Evaluation - 09/23/19 1139      Psychosocial Evaluation & Interventions   Interventions Stress management education;Encouraged to exercise with the program and follow exercise prescription;Relaxation education    Continue Psychosocial Services  Follow up required by staff           Psychosocial Re-Evaluation:  Psychosocial Re-Evaluation    [place] Name 10/20/19 1248 11/18/19 0946 12/16/19 1433         Psychosocial Re-Evaluation   Current issues with -- None Identified None Identified     Comments Patient's initial QOL score was 18.14 and her PHQ-9 score was 9. She has no diagnosis of depression. She says she feels depressed at times due to her inability to do her ADL's without SOB all due to [place]. She feels she is able to manage these feelings through her family support. She  says as she begins to feel better physically, her emotional state improves. She does take Alprazolam 0.5 mg prn for sleep. Will continue to monitor. Patient says she is feeling better emotionally because she feels better physically. Her episodes of feeling down and depressed are decreasing. She says he is  feeling life doing more now and enjoys exercising. She continues to take Alprazolam 0.5 mg prn for sleep. Will continue to monitor. Patient continues to take Alprazolam 0.5 mg for sleep. She reports her depression episodes continue to lessen as she feels better physically. Will continue to monitor.     Expected Outcomes Patient will have improved QOL and PHQ-9 scores at discharge with no psychosocial issues identified. Patient will have improved QOL and PHQ-9 scores at discharge with no psychosocial issues identified. Patient will have improved QOL and PHQ-9 scores at discharge with no psychosocial issues identified.     Interventions Encouraged to attend Pulmonary Rehabilitation for the exercise;Relaxation education;Stress management education Encouraged to attend Pulmonary Rehabilitation for the exercise;Relaxation education;Stress management education Encouraged to attend Pulmonary Rehabilitation for the exercise;Relaxation education;Stress management education     Continue Psychosocial Services  Follow up required by staff No Follow up required No Follow up required            Psychosocial Discharge (Final Psychosocial Re-Evaluation):  Psychosocial Re-Evaluation - 12/16/19 1433      Psychosocial Re-Evaluation   Current issues with None Identified    Comments Patient continues to take Alprazolam 0.5 mg for sleep. She reports her depression episodes continue to lessen as she feels better physically. Will continue to monitor.    Expected Outcomes Patient will have improved QOL and PHQ-9 scores at discharge with no psychosocial issues identified.    Interventions Encouraged to attend Pulmonary  Rehabilitation for the exercise;Relaxation education;Stress management education    Continue Psychosocial Services  No Follow up required            Education: Education Goals: Education classes will be provided on a weekly basis, covering required topics. Participant will state understanding/return demonstration of topics presented.  Learning Barriers/Preferences:  Learning Barriers/Preferences - 09/23/19 1141      Learning Barriers/Preferences   Learning Barriers None    Learning Preferences Audio;Computer/Internet;Group Instruction;Individual Instruction;Pictoral;Skilled Demonstration;Verbal Instruction;Video;Written Material           Education Topics: How Lungs Work and Diseases: - Discuss the anatomy of the lungs and diseases that can affect the lungs, such as COPD.   PULMONARY REHAB OTHER RESPIRATORY from 12/10/2019 in [place]  Date 11/19/19  Educator Etheleen Mayhew  Instruction Review Code 1- Verbalizes Understanding      Exercise: -Discuss the importance of exercise, FITT principles of exercise, normal and abnormal responses to exercise, and how to exercise safely.   Environmental Irritants: -Discuss types of environmental irritants and how to limit exposure to environmental irritants.   PULMONARY REHAB OTHER RESPIRATORY from 12/10/2019 in [place]  Date 11/26/19  Educator Etheleen Mayhew  Instruction Review Code 1- Verbalizes Understanding      Meds/Inhalers and oxygen: - Discuss respiratory medications, definition of an inhaler and oxygen, and the proper way to use an inhaler and oxygen.   Energy Saving Techniques: - Discuss methods to conserve energy and decrease shortness of breath when performing activities of daily living.    PULMONARY REHAB OTHER RESPIRATORY from 12/10/2019 in [place]  Date 12/10/19  Educator [place]  Instruction Review Code 1- Verbalizes Understanding      Bronchial  Hygiene / Breathing Techniques: - Discuss breathing mechanics, pursed-lip breathing technique,  proper posture, effective ways to clear airways, and other functional breathing techniques   Cleaning Equipment: - Provides group verbal and written instruction about the health risks of elevated stress, cause of high stress, and healthy  ways to reduce stress.   PULMONARY REHAB OTHER RESPIRATORY from 12/10/2019 in [place]  Date 09/24/19  Educator Etheleen Mayhew  Instruction Review Code 1- Verbalizes Understanding      Nutrition I: Fats: - Discuss the types of cholesterol, what cholesterol does to the body, and how cholesterol levels can be controlled.   PULMONARY REHAB OTHER RESPIRATORY from 12/10/2019 in [place]  Date 10/01/19  Educator Etheleen Mayhew  Instruction Review Code 1- Verbalizes Understanding      Nutrition II: Labels: -Discuss the different components of food labels and how to read food labels.   PULMONARY REHAB OTHER RESPIRATORY from 12/10/2019 in [place]  Date 10/08/19  Educator D. Coad  Instruction Review Code 1- Verbalizes Understanding      Respiratory Infections: - Discuss the signs and symptoms of respiratory infections, ways to prevent respiratory infections, and the importance of seeking medical treatment when having a respiratory infection.   Stress I: Signs and Symptoms: - Discuss the causes of stress, how stress may lead to anxiety and depression, and ways to limit stress.   Stress II: Relaxation: -Discuss relaxation techniques to limit stress.   PULMONARY REHAB OTHER RESPIRATORY from 12/10/2019 in [place]  Date 10/29/19  Educator DF  Instruction Review Code 2- Demonstrated Understanding      Oxygen for Home/Travel: - Discuss how to prepare for travel when on oxygen and proper ways to transport and store oxygen to ensure safety.   PULMONARY REHAB OTHER  RESPIRATORY from 12/10/2019 in [name]  Date 11/05/19  Educator [place]  Instruction Review Code 1- Verbalizes Understanding      Knowledge Questionnaire Score:  Knowledge Questionnaire Score - 09/23/19 1141      Knowledge Questionnaire Score   Pre Score 14/18           Core Components/Risk Factors/Patient Goals at Admission:  Personal Goals and Risk Factors at Admission - 09/23/19 1142      Core Components/Risk Factors/Patient Goals on Admission    Weight Management Yes    Intervention Weight Management/Obesity: Establish reasonable short term and long term weight goals.    Admit Weight 311 lb 4.8 oz (141.2 kg)    Goal Weight: Short Term 286 lb 4.8 oz (129.9 kg)    Goal Weight: Long Term 236 lb 4.8 oz (107.2 kg)    Expected Outcomes Short Term: Continue to assess and modify interventions until short term weight is achieved;Long Term: Adherence to nutrition and physical activity/exercise program aimed toward attainment of established weight goal    Personal Goal Other Yes    Personal Goal Get lungs stronger, Lose weight, get off oxygen, be healthier overall.    Intervention Attend class 2 x week and to supplement with at home exercise plan that was given 3 x week.    Expected Outcomes Reach expected goals.           Core Components/Risk Factors/Patient Goals Review:   Goals and Risk Factor Review    Row Name 10/20/19 1244 11/18/19 0947 12/16/19 1428         Core Components/Risk Factors/Patient Goals Review   Personal Goals Review Weight Management/Obesity  Get stronger; be healthier. Weight Management/Obesity;Improve shortness of breath with ADL's  Get lungs stronger; be healthier. Weight Management/Obesity;Improve shortness of breath with ADL's  Get lungs stronger; be healthier.     Review Patient has completed 8 sessions gaining 2 bls since her initial visit. She is  doing well in the program with consistent attendance. She says she is starting to feel  stronger now and has a little more energy. She feels her lungs are getting stronger. She enjoys getting out and have a routine exercise time. She feels like she is making progress toward meeting her personal goals. Will continue to monitor for progress. Patient has completed 16 sessions gaining 1 lb since last 30 day review. She continues to do well in the program with progression and consistent attendance. She continues to say she is feeling stronger and has more energy. She says her lungs are getting stronger and she continues to enjoy exercising. Will continue to monitor for progress. Patient has completed 23 sessions losing 4 lbs. She continues to do well in the program with progression and consistent attendance. She says she really enjoys coming here to exercise. She reports feeling stronger and having more energy to keep up with her teenage children. She says she feels like her lungs are getting stronger and she is getting back to where she was before COVID. Will continue to montior for progress.     Expected Outcomes Patient will continue to attend sessions and complete the program meeting both personal and program goals. Patient will continue to attend sessions and complete the program meeting both personal and program goals. Patient will continue to attend sessions and complete the program meeting both personal and program goals.            Core Components/Risk Factors/Patient Goals at Discharge (Final Review):   Goals and Risk Factor Review - 12/16/19 1428      Core Components/Risk Factors/Patient Goals Review   Personal Goals Review Weight Management/Obesity;Improve shortness of breath with ADL's   Get lungs stronger; be healthier.   Review Patient has completed 23 sessions losing 4 lbs. She continues to do well in the program with progression and consistent attendance. She says she really enjoys coming here to exercise. She reports feeling stronger and having more energy to keep up with her  teenage children. She says she feels like her lungs are getting stronger and she is getting back to where she was before COVID. Will continue to montior for progress.    Expected Outcomes Patient will continue to attend sessions and complete the program meeting both personal and program goals.           ITP Comments:  ITP Comments    Row Name 09/23/19 1131           ITP Comments Patient is being sent by her pulmonary doctor due to having COVID-19. she is eager to get started and get stronger. Her goals are to gain lung strength, come off oxygen, and just have better health.              Comments: ITP REVIEW Pt is making expected progress toward pulmonary rehab goals after completing 23 sessions. Recommend continued exercise, life style modification, education, and utilization of breathing techniques to increase stamina and strength and decrease shortness of breath with exertion.

## 2019-12-17 ENCOUNTER — Other Ambulatory Visit: Payer: Self-pay

## 2019-12-17 ENCOUNTER — Encounter (HOSPITAL_COMMUNITY)
Admission: RE | Admit: 2019-12-17 | Discharge: 2019-12-17 | Disposition: A | Discharge status: Home / Self Care | Payer: Commercial Managed Care - PPO | Source: Ambulatory Visit | Attending: Internal Medicine | Admitting: Internal Medicine

## 2019-12-17 DIAGNOSIS — R0602 Shortness of breath: Secondary | ICD-10-CM

## 2019-12-17 NOTE — Progress Notes (Signed)
Daily Session Note  Patient Details  Name: XAVIA KNISKERN MRN: [id number] Date of Birth: 1965/07/11 Referring Provider:     PULMONARY REHAB OTHER RESP ORIENTATION from 09/23/2019 in [place]  Referring Provider Dr. Shearon Stalls      Encounter Date: 12/17/2019  Check In:  Session Check In - 12/17/19 1048      Check-In   Supervising physician immediately available to respond to emergencies See telemetry face sheet for immediately available MD    Location [facility]    Staff Present Algis Downs, Exercise Physiologist;Jakeb Lamping Rosezella [place], RN, [place]    Virtual Visit No    Medication changes reported     No    Fall or balance concerns reported    No    Tobacco Cessation No Change    Warm-up and Cool-down Performed as group-led instruction    Resistance Training Performed Yes    VAD Patient? No    PAD/SET Patient? No      Pain Assessment   Currently in Pain? No/denies    Pain Score 0-No pain    Multiple Pain Sites No           Capillary Blood Glucose: No results found for this or any previous visit (from the past 24 hour(s)).    Social History   Tobacco Use  Smoking Status Never Smoker  Smokeless Tobacco Never Used    Goals Met:  Proper associated with RPD/PD & O2 Sat Independence with exercise equipment Improved SOB with ADL's Using PLB without cueing & demonstrates good technique Exercise tolerated well No report of cardiac concerns or symptoms Strength training completed today  Goals Unmet:  Not Applicable  Comments: [id number]   Dr. Kathie Dike is Medical Director for [facility] Pulmonary Rehab.

## 2019-12-22 ENCOUNTER — Other Ambulatory Visit: Payer: Self-pay

## 2019-12-22 ENCOUNTER — Encounter (HOSPITAL_COMMUNITY)
Admission: RE | Admit: 2019-12-22 | Discharge: 2019-12-22 | Disposition: A | Discharge status: Home / Self Care | Payer: Commercial Managed Care - PPO | Source: Ambulatory Visit | Attending: Internal Medicine | Admitting: Internal Medicine

## 2019-12-22 VITALS — Wt 307.8 lb

## 2019-12-22 DIAGNOSIS — R0602 Shortness of breath: Secondary | ICD-10-CM | POA: Diagnosis not present

## 2019-12-22 NOTE — Progress Notes (Signed)
Daily Session Note  Patient Details  Name: Gina Conner MRN: [id number] Date of Birth: 1965-11-27 Referring Provider:     PULMONARY REHAB OTHER RESP ORIENTATION from 09/23/2019 in [place]  Referring Provider Dr. Shearon Stalls      Encounter Date: 12/22/2019  Check In:  Session Check In - 12/22/19 1045      Check-In   Supervising physician immediately available to respond to emergencies See telemetry face sheet for immediately available MD    Location [facility]    Staff Present Geanie Cooley, RN;Vaniece Hatchett, Exercise Physiologist    Virtual Visit No    Medication changes reported     No    Fall or balance concerns reported    No    Tobacco Cessation No Change    Warm-up and Cool-down Performed as group-led instruction    Resistance Training Performed Yes    VAD Patient? No    PAD/SET Patient? No      Pain Assessment   Currently in Pain? No/denies    Pain Score 0-No pain    Multiple Pain Sites No           Capillary Blood Glucose: No results found for this or any previous visit (from the past 24 hour(s)).    Social History   Tobacco Use  Smoking Status Never Smoker  Smokeless Tobacco Never Used    Goals Met:  Proper associated with RPD/PD & O2 Sat Independence with exercise equipment Improved SOB with ADL's Using PLB without cueing & demonstrates good technique Exercise tolerated well Personal goals reviewed No report of cardiac concerns or symptoms Strength training completed today  Goals Unmet:  Not Applicable  Comments: check out 11:45   Dr. Kathie Dike is Medical Director for [facility] Pulmonary Rehab.

## 2019-12-24 ENCOUNTER — Encounter (HOSPITAL_COMMUNITY)
Admission: RE | Admit: 2019-12-24 | Discharge: 2019-12-24 | Disposition: A | Discharge status: Home / Self Care | Payer: Commercial Managed Care - PPO | Source: Ambulatory Visit | Attending: Internal Medicine | Admitting: Internal Medicine

## 2019-12-24 ENCOUNTER — Other Ambulatory Visit: Payer: Self-pay

## 2019-12-24 DIAGNOSIS — R0602 Shortness of breath: Secondary | ICD-10-CM | POA: Diagnosis not present

## 2019-12-24 NOTE — Progress Notes (Signed)
Daily Session Note  Patient Details  Name: Gina Conner MRN: [id number] Date of Birth: Jan 31, 1966 Referring Provider:     PULMONARY REHAB OTHER RESP ORIENTATION from 09/23/2019 in [place]  Referring Provider Dr. Shearon Stalls      Encounter Date: 12/24/2019  Check In:  Session Check In - 12/24/19 1045      Check-In   Supervising physician immediately available to respond to emergencies See telemetry face sheet for immediately available MD    Location [facility]    Staff Present Aundra Dubin, RN, BSN;Vaniece Hatchett, Exercise Physiologist    Virtual Visit No    Medication changes reported     No    Fall or balance concerns reported    No    Tobacco Cessation No Change    Warm-up and Cool-down Performed as group-led instruction    Resistance Training Performed Yes    VAD Patient? No    PAD/SET Patient? No      Pain Assessment   Currently in Pain? No/denies    Pain Score 0-No pain    Multiple Pain Sites No           Capillary Blood Glucose: No results found for this or any previous visit (from the past 24 hour(s)).    Social History   Tobacco Use  Smoking Status Never Smoker  Smokeless Tobacco Never Used    Goals Met:  Proper associated with RPD/PD & O2 Sat Independence with exercise equipment Improved SOB with ADL's Using PLB without cueing & demonstrates good technique Exercise tolerated well No report of cardiac concerns or symptoms Strength training completed today  Goals Unmet:  Not Applicable  Comments: Check out 1145.   Dr. Kathie Dike is Medical Director for [facility] Pulmonary Rehab.

## 2019-12-29 ENCOUNTER — Encounter (HOSPITAL_COMMUNITY): Payer: Commercial Managed Care - PPO

## 2019-12-31 ENCOUNTER — Encounter (HOSPITAL_COMMUNITY): Payer: Commercial Managed Care - PPO

## 2020-01-05 ENCOUNTER — Encounter (HOSPITAL_COMMUNITY): Payer: Commercial Managed Care - PPO

## 2020-01-07 ENCOUNTER — Encounter (HOSPITAL_COMMUNITY): Payer: Commercial Managed Care - PPO

## 2020-01-12 ENCOUNTER — Encounter (HOSPITAL_COMMUNITY): Payer: Commercial Managed Care - PPO

## 2020-01-14 ENCOUNTER — Encounter (HOSPITAL_COMMUNITY): Payer: Commercial Managed Care - PPO

## 2020-01-14 NOTE — Progress Notes (Signed)
Pulmonary Individual Treatment Plan  Patient Details  Name: Gina Conner MRN: [id number] Date of Birth: 03-18-1966 Referring Provider:     PULMONARY REHAB OTHER RESP ORIENTATION from 09/23/2019 in [place]  Referring Provider Dr. Shearon Stalls      Initial Encounter Date:    [facility] from 09/23/2019 in [place]  Date 09/23/19      Visit Diagnosis: SOB (shortness of breath)  Patient's Home Medications on Admission:   Current Outpatient Medications:  .  acetaminophen (TYLENOL) 500 MG tablet, Take 1,000 mg by mouth every 6 (six) hours as needed for mild pain or headache. , Disp: , Rfl:  .  albuterol (VENTOLIN HFA) 108 (90 Base) MCG/ACT inhaler, Inhale 2 puffs into the lungs every 6 (six) hours as needed for wheezing or shortness of breath., Disp: 6.7 g, Rfl: 0 .  ALPRAZolam (XANAX) 0.5 MG tablet, Take 0.5 mg by mouth at bedtime as needed for sleep., Disp: , Rfl:  .  Cholecalciferol (D 5000) 5000 units capsule, Take 5,000 Units by mouth daily., Disp: , Rfl:  .  ipratropium-albuterol (DUONEB) 0.5-2.5 (3) MG/3ML SOLN, Take 3 mLs by nebulization every 6 (six) hours as needed (shortness of breath)., Disp: 360 mL, Rfl: 3 .  levothyroxine (SYNTHROID) 88 MCG tablet, Take 88 mcg by mouth daily before breakfast. Pt unsure of strength. , Disp: , Rfl:  .  spironolactone (ALDACTONE) 25 MG tablet, Take 25 mg by mouth at bedtime., Disp: , Rfl:   Past Medical History: Past Medical History:  Diagnosis Date  . DVT (deep venous thrombosis) ([place])    provoked during tendon surgery was treated 3 months of coumadin  . Hypothyroidism     Tobacco Use: Social History   Tobacco Use  Smoking Status Never Smoker  Smokeless Tobacco Never Used    Labs: Recent Review Flowsheet Data    Labs for ITP Cardiac and Pulmonary Rehab Latest Ref Rng & Units 05/18/2019 05/22/2019 05/23/2019   Cholestrol 0 - 200 mg/dL - - 121   LDLCALC 0 - 99  mg/dL - - 68   HDL >40 mg/dL - - 31(L)   Trlycerides <150 mg/dL 126 - 108   Hemoglobin A1c 4.8 - 5.6 % - 6.2(H) -      Capillary Blood Glucose: No results found for: GLUCAP   Pulmonary Assessment Scores:  Pulmonary Assessment Scores    Row Name 09/23/19 1137         ADL UCSD   ADL Phase Entry     SOB Score total 53     Rest 0     Walk 10     Stairs 4     Bath 2     Dress 2     Shop 3       CAT Score   CAT Score 20       mMRC Score   mMRC Score 3           UCSD: Self-administered rating of dyspnea associated with activities of daily living (ADLs) 6-point scale (0 = "not at all" to 5 = "maximal or unable to do because of breathlessness")  Scoring Scores range from 0 to 120.  Minimally important difference is 5 units  CAT: CAT can identify the health impairment of COPD patients and is better correlated with disease progression.  CAT has a scoring range of zero to 40. The CAT score is classified into four groups of low (less than 10), medium (  10 - 20), high (21-30) and very high (31-40) based on the impact level of disease on health status. A CAT score over 10 suggests significant symptoms.  A worsening CAT score could be explained by an exacerbation, poor medication adherence, poor inhaler technique, or progression of COPD or comorbid conditions.  CAT MCID is 2 points  mMRC: mMRC (Modified Medical Research Council) Dyspnea Scale is used to assess the degree of baseline functional disability in patients of respiratory disease due to dyspnea. No minimal important difference is established. A decrease in score of 1 point or greater is considered a positive change.   Pulmonary Function Assessment:   Exercise Target Goals: Exercise Program Goal: Individual exercise prescription set using results from initial 6 min walk test and THRR while considering  patient's activity barriers and safety.   Exercise Prescription Goal: Initial exercise prescription builds to 30-45  minutes a day of aerobic activity, 2-3 days per week.  Home exercise guidelines will be given to patient during program as part of exercise prescription that the participant will acknowledge.  Activity Barriers & Risk Stratification:  Activity Barriers & Cardiac Risk Stratification - 09/23/19 1142      Activity Barriers & Cardiac Risk Stratification   Activity Barriers Deconditioning;Shortness of Breath    Cardiac Risk Stratification Low           6 Minute Walk:  6 Minute Walk    Row Name 09/23/19 1139         6 Minute Walk   Phase Initial     Distance 1200 feet     Walk Time 6 minutes     # of Rest Breaks 0     MPH 2.27     METS 2.74     RPE 13     Perceived Dyspnea  13     VO2 Peak 9.44     Symptoms No     Resting HR 76 bpm     Resting BP 110/80     Resting Oxygen Saturation  97 %     Exercise Oxygen Saturation  during 6 min walk 94 %     Max Ex. HR 110 bpm     Max Ex. BP 146/106     2 Minute Post BP 110/98            Oxygen Initial Assessment:  Oxygen Initial Assessment - 09/23/19 1135      Home Oxygen   Home Oxygen Device E-Tanks    Sleep Oxygen Prescription None    Home Exercise Oxygen Prescription Continuous    Liters per minute 1    Home at Rest Exercise Oxygen Prescription None    Compliance with Home Oxygen Use Yes      Initial 6 min Walk   Oxygen Used None      Program Oxygen Prescription   Program Oxygen Prescription Continuous   As needed   Liters per minute 2      Intervention   Short Term Goals To learn and exhibit compliance with exercise, home and travel O2 prescription;To learn and understand importance of maintaining oxygen saturations>88%;To learn and demonstrate proper pursed lip breathing techniques or other breathing techniques.    Long  Term Goals Exhibits compliance with exercise, home and travel O2 prescription;Maintenance of O2 saturations>88%           Oxygen Re-Evaluation:  Oxygen Re-Evaluation    Row Name 10/20/19  1241 11/18/19 0944 12/16/19 1424 01/13/20 8841  Program Oxygen Prescription   Program Oxygen Prescription None None None None      Home Oxygen   Home Oxygen Device None None None None    Sleep Oxygen Prescription None None None None    Home Exercise Oxygen Prescription None None None None    Home at Rest Exercise Oxygen Prescription None None None None    Compliance with Home Oxygen Use Yes Yes Yes Yes      Goals/Expected Outcomes   Short Term Goals To learn and exhibit compliance with exercise, home and travel O2 prescription;To learn and understand importance of maintaining oxygen saturations>88%;To learn and demonstrate proper pursed lip breathing techniques or other breathing techniques. To learn and exhibit compliance with exercise, home and travel O2 prescription;To learn and understand importance of maintaining oxygen saturations>88%;To learn and demonstrate proper pursed lip breathing techniques or other breathing techniques. To learn and exhibit compliance with exercise, home and travel O2 prescription;To learn and understand importance of maintaining oxygen saturations>88%;To learn and demonstrate proper pursed lip breathing techniques or other breathing techniques. To learn and exhibit compliance with exercise, home and travel O2 prescription;To learn and understand importance of maintaining oxygen saturations>88%;To learn and demonstrate proper pursed lip breathing techniques or other breathing techniques.    Long  Term Goals Exhibits compliance with exercise, home and travel O2 prescription;Maintenance of O2 saturations>88% Exhibits compliance with exercise, home and travel O2 prescription;Maintenance of O2 saturations>88% Exhibits compliance with exercise, home and travel O2 prescription;Maintenance of O2 saturations>88% Exhibits compliance with exercise, home and travel O2 prescription;Maintenance of O2 saturations>88%    Comments Patient is meeting her expected goals and  outcomes both long and short term. Will continue to monitor for progress. Patient is meeting her expected goals and outcomes both long and short term. Will continue to monitor for progress. Patient is meeting her expected goals and outcomes both long and short term. Will continue to monitor for progress. Patient is meeting her expected goals and outcomes both long and short term. Will continue to monitor for progress.    Goals/Expected Outcomes Patient will continue to meet her expected goals and outcomes. Patient will continue to meet her expected goals and outcomes. Patient will continue to meet her expected goals and outcomes. Patient will continue to meet her expected goals and outcomes.           Oxygen Discharge (Final Oxygen Re-Evaluation):  Oxygen Re-Evaluation - 01/13/20 0843      Program Oxygen Prescription   Program Oxygen Prescription None      Home Oxygen   Home Oxygen Device None    Sleep Oxygen Prescription None    Home Exercise Oxygen Prescription None    Home at Rest Exercise Oxygen Prescription None    Compliance with Home Oxygen Use Yes      Goals/Expected Outcomes   Short Term Goals To learn and exhibit compliance with exercise, home and travel O2 prescription;To learn and understand importance of maintaining oxygen saturations>88%;To learn and demonstrate proper pursed lip breathing techniques or other breathing techniques.    Long  Term Goals Exhibits compliance with exercise, home and travel O2 prescription;Maintenance of O2 saturations>88%    Comments Patient is meeting her expected goals and outcomes both long and short term. Will continue to monitor for progress.    Goals/Expected Outcomes Patient will continue to meet her expected goals and outcomes.           Initial Exercise Prescription:  Initial Exercise Prescription - 09/23/19 1100  Date of Initial Exercise RX and Referring Provider   Date 09/23/19    Referring Provider Dr. Shearon Stalls    Expected  Discharge Date 12/24/19      Treadmill   MPH 1.2    Grade 0    Minutes 17    METs 1.91      T5 Nustep   Level 1    SPM 73    Minutes 22    METs 1.7      Prescription Details   Frequency (times per week) 2    Duration Progress to 30 minutes of continuous aerobic without signs/symptoms of physical distress      Intensity   Ratings of Perceived Exertion 11-13    Perceived Dyspnea 0-4      Progression   Progression Continue progressive overload as per policy without signs/symptoms or physical distress.      Resistance Training   Training Prescription Yes    Weight 1    Reps 10-15           Perform Capillary Blood Glucose checks as needed.  Exercise Prescription Changes:   Exercise Prescription Changes    Row Name 10/09/19 1500 10/26/19 1400 11/09/19 1300 11/24/19 1000 12/03/19 1152     Response to Exercise   Blood Pressure (Admit) 122/88 138/70 120/82 120/72 128/80   Blood Pressure (Exercise) 142/86 152/88 118/80 148/78 136/84   Blood Pressure (Exit) 122/84 112/82 120/70 124/80 124/84   Heart Rate (Admit) 76 bpm 71 bpm 81 bpm 77 bpm 87 bpm   Heart Rate (Exercise) 97 bpm 99 bpm 102 bpm 107 bpm 99 bpm   Heart Rate (Exit) 81 bpm 84 bpm 90 bpm 85 bpm 84 bpm   Oxygen Saturation (Admit) 97 % 98 % 95 % 95 % 95 %   Oxygen Saturation (Exercise) 93 % 95 % 95 % 94 % 92 %   Oxygen Saturation (Exit) 96 % 92 % 97 % 98 % 94 %   Rating of Perceived Exertion (Exercise) 11 11 11 11 11    Perceived Dyspnea (Exercise) 11 11 11 11 11    Symptoms none -- -- -- --   Duration Continue with 30 min of aerobic exercise without signs/symptoms of physical distress. Continue with 30 min of aerobic exercise without signs/symptoms of physical distress. Continue with 30 min of aerobic exercise without signs/symptoms of physical distress. Continue with 30 min of aerobic exercise without signs/symptoms of physical distress. Continue with 30 min of aerobic exercise without signs/symptoms of physical  distress.   Intensity THRR unchanged THRR unchanged THRR unchanged THRR unchanged THRR unchanged     Progression   Progression Continue to progress workloads to maintain intensity without signs/symptoms of physical distress. Continue to progress workloads to maintain intensity without signs/symptoms of physical distress. Continue to progress workloads to maintain intensity without signs/symptoms of physical distress. Continue to progress workloads to maintain intensity without signs/symptoms of physical distress. Continue to progress workloads to maintain intensity without signs/symptoms of physical distress.   Average METs 2.15 -- -- -- --     Resistance Training   Training Prescription Yes Yes Yes Yes Yes   Weight 2 2 2 3 3    Reps 10-15 10-15 10-15 10-15 10-15   Time 10 Minutes -- -- -- --     Oxygen   Liters 0 -- -- -- --     Treadmill   MPH 1.3 1.4 1.6 1.8 2   Grade 0 0 0 0 0   Minutes 17 17  17 17 17    METs 1.99 1.99 2.22 2.37 2.53     T5 Nustep   Level 1 4 2 2 2    SPM 105 118 126 126 139   Minutes 22 22 22 22 22    METs 2.4 2.5 2.3 3.5 3.4     Home Exercise Plan   Plans to continue exercise at Home (comment) -- -- -- --   Frequency Add 3 additional days to program exercise sessions. -- -- -- --   Initial Home Exercises Provided 09/23/19 -- -- -- --   [place] Name 12/22/19 1100 12/24/19 1332           Response to Exercise   Blood Pressure (Admit) 122/70 120/80      Blood Pressure (Exercise) 162/80 140/70      Blood Pressure (Exit) 104/82 130/80      Heart Rate (Admit) 64 bpm 74 bpm      Heart Rate (Exercise) 97 bpm 102 bpm      Heart Rate (Exit) 80 bpm 86 bpm      Oxygen Saturation (Admit) 99 % 96 %      Oxygen Saturation (Exercise) 94 % 93 %      Oxygen Saturation (Exit) 99 % 95 %      Rating of Perceived Exertion (Exercise) 11 11      Perceived Dyspnea (Exercise) 11 11      Duration Continue with 30 min of aerobic exercise without signs/symptoms of physical distress.  Continue with 30 min of aerobic exercise without signs/symptoms of physical distress.      Intensity THRR unchanged THRR unchanged        Progression   Progression Continue to progress workloads to maintain intensity without signs/symptoms of physical distress. Continue to progress workloads to maintain intensity without signs/symptoms of physical distress.        Resistance Training   Training Prescription Yes Yes      Weight 3 3      Reps 10-15 10-15        Treadmill   MPH 2.2 2.2      Grade 0 0      Minutes 17 17      METs 2.68 2.68        T5 Nustep   Level 3 3      SPM 135 120      Minutes 22 22      METs 3.1 3.1             Exercise Comments:   Exercise Comments    Row Name 09/23/19 1153 10/09/19 1525         Exercise Comments Patient preformed well just got of little short of breath. Patient is looking foward to starting the program. Patient has been here 6 sessions so far. Patient is preforming well and very motivated to reach her goals. We will continue to progress as tolerated.             Exercise Goals and Review:   Exercise Goals    Row Name 09/23/19 1151 12/15/19 1247 01/13/20 0844         Exercise Goals   Increase Physical Activity Yes Yes Yes     Intervention Provide advice, education, support and counseling about physical activity/exercise needs.;Develop an individualized exercise prescription for aerobic and resistive training based on initial evaluation findings, risk stratification, comorbidities and participant's personal goals. Provide advice, education, support and counseling about physical activity/exercise needs.;Develop an individualized exercise prescription for  aerobic and resistive training based on initial evaluation findings, risk stratification, comorbidities and participant's personal goals. Provide advice, education, support and counseling about physical activity/exercise needs.;Develop an individualized exercise prescription for  aerobic and resistive training based on initial evaluation findings, risk stratification, comorbidities and participant's personal goals.     Expected Outcomes Short Term: Attend rehab on a regular basis to increase amount of physical activity.;Long Term: Add in home exercise to make exercise part of routine and to increase amount of physical activity. Short Term: Attend rehab on a regular basis to increase amount of physical activity.;Long Term: Add in home exercise to make exercise part of routine and to increase amount of physical activity.;Long Term: Exercising regularly at least 3-5 days a week. Short Term: Attend rehab on a regular basis to increase amount of physical activity.;Long Term: Add in home exercise to make exercise part of routine and to increase amount of physical activity.;Long Term: Exercising regularly at least 3-5 days a week.     Increase Strength and Stamina Yes Yes Yes     Intervention Provide advice, education, support and counseling about physical activity/exercise needs.;Develop an individualized exercise prescription for aerobic and resistive training based on initial evaluation findings, risk stratification, comorbidities and participant's personal goals. Provide advice, education, support and counseling about physical activity/exercise needs.;Develop an individualized exercise prescription for aerobic and resistive training based on initial evaluation findings, risk stratification, comorbidities and participant's personal goals. Provide advice, education, support and counseling about physical activity/exercise needs.;Develop an individualized exercise prescription for aerobic and resistive training based on initial evaluation findings, risk stratification, comorbidities and participant's personal goals.     Expected Outcomes Long Term: Improve cardiorespiratory fitness, muscular endurance and strength as measured by increased METs and functional capacity (6MWT);Short Term: Perform  resistance training exercises routinely during rehab and add in resistance training at home Short Term: Increase workloads from initial exercise prescription for resistance, speed, and METs.;Short Term: Perform resistance training exercises routinely during rehab and add in resistance training at home Short Term: Increase workloads from initial exercise prescription for resistance, speed, and METs.;Short Term: Perform resistance training exercises routinely during rehab and add in resistance training at home     Able to understand and use rate of perceived exertion (RPE) scale Yes Yes Yes     Intervention Provide education and explanation on how to use RPE scale Provide education and explanation on how to use RPE scale Provide education and explanation on how to use RPE scale     Expected Outcomes Short Term: Able to use RPE daily in rehab to express subjective intensity level;Long Term:  Able to use RPE to guide intensity level when exercising independently Short Term: Able to use RPE daily in rehab to express subjective intensity level;Long Term:  Able to use RPE to guide intensity level when exercising independently Short Term: Able to use RPE daily in rehab to express subjective intensity level;Long Term:  Able to use RPE to guide intensity level when exercising independently     Able to understand and use Dyspnea scale Yes Yes Yes     Intervention Provide education and explanation on how to use Dyspnea scale Provide education and explanation on how to use Dyspnea scale Provide education and explanation on how to use Dyspnea scale     Expected Outcomes Short Term: Able to use Dyspnea scale daily in rehab to express subjective sense of shortness of breath during exertion;Long Term: Able to use Dyspnea scale to guide intensity level when exercising  independently Short Term: Able to use Dyspnea scale daily in rehab to express subjective sense of shortness of breath during exertion;Long Term: Able to use  Dyspnea scale to guide intensity level when exercising independently Short Term: Able to use Dyspnea scale daily in rehab to express subjective sense of shortness of breath during exertion;Long Term: Able to use Dyspnea scale to guide intensity level when exercising independently     Knowledge and understanding of Target Heart Rate Range (THRR) Yes Yes Yes     Intervention Provide education and explanation of THRR including how the numbers were predicted and where they are located for reference Provide education and explanation of THRR including how the numbers were predicted and where they are located for reference Provide education and explanation of THRR including how the numbers were predicted and where they are located for reference     Expected Outcomes Long Term: Able to use THRR to govern intensity when exercising independently;Short Term: Able to state/look up THRR Short Term: Able to state/look up THRR;Long Term: Able to use THRR to govern intensity when exercising independently;Short Term: Able to use daily as guideline for intensity in rehab Short Term: Able to state/look up THRR;Long Term: Able to use THRR to govern intensity when exercising independently;Short Term: Able to use daily as guideline for intensity in rehab     Able to check pulse independently Yes -- --     Intervention Provide education and demonstration on how to check pulse in carotid and radial arteries.;Review the importance of being able to check your own pulse for safety during independent exercise -- --     Expected Outcomes Short Term: Able to explain why pulse checking is important during independent exercise;Long Term: Able to check pulse independently and accurately -- --     Understanding of Exercise Prescription Yes Yes Yes     Intervention Provide education, explanation, and written materials on patient's individual exercise prescription Provide education, explanation, and written materials on patient's individual  exercise prescription Provide education, explanation, and written materials on patient's individual exercise prescription     Expected Outcomes Short Term: Able to explain program exercise prescription;Long Term: Able to explain home exercise prescription to exercise independently Short Term: Able to explain program exercise prescription;Long Term: Able to explain home exercise prescription to exercise independently Short Term: Able to explain program exercise prescription;Long Term: Able to explain home exercise prescription to exercise independently            Exercise Goals Re-Evaluation :  Exercise Goals Re-Evaluation    Row Name 10/12/19 1252 11/13/19 1451 12/15/19 1249 01/13/20 0844       Exercise Goal Re-Evaluation   Exercise Goals Review Increase Physical Activity;Increase Strength and Stamina Increase Physical Activity;Increase Strength and Stamina Increase Physical Activity;Increase Strength and Stamina;Able to understand and use rate of perceived exertion (RPE) scale;Able to understand and use Dyspnea scale;Knowledge and understanding of Target Heart Rate Range (THRR);Understanding of Exercise Prescription;Improve claudication pain tolerance and improve walking ability Increase Physical Activity;Increase Strength and Stamina;Able to understand and use rate of perceived exertion (RPE) scale;Able to understand and use Dyspnea scale;Knowledge and understanding of Target Heart Rate Range (THRR);Understanding of Exercise Prescription    Comments Patients goals are to get lungs stronger, be healthier and to lose 50lbs. Patient feels that she is getting stronger with each visit. She says she is already able to do more at home and while shopping. Patient is not losing any weight. She has gained 2lbs. We will talk  to her about her weight lose goals and to discuss her diet. We will continue to monitor her progress. She is breathing and feeling better so far.The program has motivated her to lose weight  and live a better lifestyle. She also feels better about exercising on her own since her oxygen levls have improved. She tolerates increased workloads well. She works hard towards her goals each session. Patient said that she feels good and heathier. She is still working on the weight loss. She is exercising at home and has changed her diet. She pushes herself and others in class to reach their goals. She has a steady progression so far. She mention still feeling some tightness in her throat and will adress that with her doctor. We will continue to progress as tolerated. Pt has attended 25 exercise sessions. She has been absent from the program since 12/24/19. She has been working out at [employer] in order to exercise 3-5 days per week. She is feeling much stronger. She currently exercises at 3.1 METs on the stepper. Will continue to monitor and progress as able.    Expected Outcomes To reach her expected goals. To reach expected goals To reach expected goals Through exercise at rehab and by engaging in a home exercise program, the pt will reach their goals.           Discharge Exercise Prescription (Final Exercise Prescription Changes):  Exercise Prescription Changes - 12/24/19 1332      Response to Exercise   Blood Pressure (Admit) 120/80    Blood Pressure (Exercise) 140/70    Blood Pressure (Exit) 130/80    Heart Rate (Admit) 74 bpm    Heart Rate (Exercise) 102 bpm    Heart Rate (Exit) 86 bpm    Oxygen Saturation (Admit) 96 %    Oxygen Saturation (Exercise) 93 %    Oxygen Saturation (Exit) 95 %    Rating of Perceived Exertion (Exercise) 11    Perceived Dyspnea (Exercise) 11    Duration Continue with 30 min of aerobic exercise without signs/symptoms of physical distress.    Intensity THRR unchanged      Progression   Progression Continue to progress workloads to maintain intensity without signs/symptoms of physical distress.      Resistance Training   Training Prescription Yes     Weight 3    Reps 10-15      Treadmill   MPH 2.2    Grade 0    Minutes 17    METs 2.68      T5 Nustep   Level 3    SPM 120    Minutes 22    METs 3.1           Nutrition:  Target Goals: Understanding of nutrition guidelines, daily intake of sodium 1500mg , cholesterol 200mg , calories 30% from fat and 7% or less from saturated fats, daily to have 5 or more servings of fruits and vegetables.  Biometrics:  Pre Biometrics - 09/23/19 1156      Pre Biometrics   Height 5\' 7"  (1.702 m)    Weight 141.2 kg    Waist Circumference 55 inches    Hip Circumference 60 inches    Waist to Hip Ratio 0.92 %    BMI (Calculated) 48.75    Triceps Skinfold 15 mm    % Body Fat 52.3 %    Grip Strength 25.53 kg    Flexibility 14.33 in    Single Leg Stand 9.29 seconds  Nutrition Therapy Plan and Nutrition Goals:  Nutrition Therapy & Goals - 01/13/20 1343      Personal Nutrition Goals   Comments Patient continues to say she trys to heart healthy. Will continue to monitor.      Intervention Plan   Intervention Nutrition handout(s) given to patient.           Nutrition Assessments:  Nutrition Assessments - 09/23/19 1141      MEDFICTS Scores   Pre Score 35           Nutrition Goals Re-Evaluation:   Nutrition Goals Discharge (Final Nutrition Goals Re-Evaluation):   Psychosocial: Target Goals: Acknowledge presence or absence of significant depression and/or stress, maximize coping skills, provide positive support system. Participant is able to verbalize types and ability to use techniques and skills needed for reducing stress and depression.  Initial Review & Psychosocial Screening:  Initial Psych Review & Screening - 09/23/19 1138      Initial Review   Current issues with Current Depression   All due to having COVID-19 and not being able to do ADL's     [place]? Yes      Barriers   Psychosocial barriers to participate in  program The patient should benefit from training in stress management and relaxation.      Screening Interventions   Interventions Encouraged to exercise    Expected Outcomes Short Term goal: Identification and review with participant of any Quality of Life or Depression concerns found by scoring the questionnaire.;Long Term goal: The participant improves quality of Life and PHQ9 Scores as seen by post scores and/or verbalization of changes           Quality of Life Scores:  Quality of Life - 09/23/19 1200      Quality of Life   Select Quality of Life      Quality of Life Scores   Health/Function Pre 13.31 %    Socioeconomic Pre 25.5 %    Psych/Spiritual Pre 15.43 %    Family Pre 30 %    GLOBAL Pre 18.14 %          Scores of 19 and below usually indicate a poorer quality of life in these areas.  A difference of  2-3 points is a clinically meaningful difference.  A difference of 2-3 points in the total score of the Quality of Life Index has been associated with significant improvement in overall quality of life, self-image, physical symptoms, and general health in studies assessing change in quality of life.   PHQ-9: Recent Review Flowsheet Data    Depression screen [facility] 2/9 09/23/2019   Decreased Interest 0   Down, Depressed, Hopeless 1   PHQ - 2 Score 1   Altered sleeping 2   Tired, decreased energy 3   Change in appetite 1   Feeling bad or failure about yourself  1   Trouble concentrating 1   Moving slowly or fidgety/restless 0   Suicidal thoughts 0   PHQ-9 Score 9   Difficult doing work/chores Very difficult     Interpretation of Total Score  Total Score Depression Severity:  1-4 = Minimal depression, 5-9 = Mild depression, 10-14 = Moderate depression, 15-19 = Moderately severe depression, 20-27 = Severe depression   Psychosocial Evaluation and Intervention:  Psychosocial Evaluation - 09/23/19 1139      Psychosocial Evaluation & Interventions   Interventions  Stress management education;Encouraged to exercise with the program and follow exercise  prescription;Relaxation education    Continue Psychosocial Services  Follow up required by staff           Psychosocial Re-Evaluation:  Psychosocial Re-Evaluation    Row Name 10/20/19 1248 11/18/19 0946 12/16/19 1433 01/13/20 1344       Psychosocial Re-Evaluation   Current issues with -- None Identified None Identified None Identified    Comments Patient's initial QOL score was 18.14 and her PHQ-9 score was 9. She has no diagnosis of depression. She says she feels depressed at times due to her inability to do her ADL's without SOB all due to [place]. She feels she is able to manage these feelings through her family support. She says as she begins to feel better physically, her emotional state improves. She does take Alprazolam 0.5 mg prn for sleep. Will continue to monitor. Patient says she is feeling better emotionally because she feels better physically. Her episodes of feeling down and depressed are decreasing. She says he is feeling life doing more now and enjoys exercising. She continues to take Alprazolam 0.5 mg prn for sleep. Will continue to monitor. Patient continues to take Alprazolam 0.5 mg for sleep. She reports her depression episodes continue to lessen as she feels better physically. Will continue to monitor. Patient continues to take Alprazolam 0.5 mg for sleep. She reports her depression episodes continue to lessen as she feels better physically. Will continue to monitor.    Expected Outcomes Patient will have improved QOL and PHQ-9 scores at discharge with no psychosocial issues identified. Patient will have improved QOL and PHQ-9 scores at discharge with no psychosocial issues identified. Patient will have improved QOL and PHQ-9 scores at discharge with no psychosocial issues identified. Patient will have improved QOL and PHQ-9 scores at discharge with no psychosocial issues identified.     Interventions Encouraged to attend Pulmonary Rehabilitation for the exercise;Relaxation education;Stress management education Encouraged to attend Pulmonary Rehabilitation for the exercise;Relaxation education;Stress management education Encouraged to attend Pulmonary Rehabilitation for the exercise;Relaxation education;Stress management education Encouraged to attend Pulmonary Rehabilitation for the exercise;Relaxation education;Stress management education    Continue Psychosocial Services  Follow up required by staff No Follow up required No Follow up required No Follow up required           Psychosocial Discharge (Final Psychosocial Re-Evaluation):  Psychosocial Re-Evaluation - 01/13/20 1344      Psychosocial Re-Evaluation   Current issues with None Identified    Comments Patient continues to take Alprazolam 0.5 mg for sleep. She reports her depression episodes continue to lessen as she feels better physically. Will continue to monitor.    Expected Outcomes Patient will have improved QOL and PHQ-9 scores at discharge with no psychosocial issues identified.    Interventions Encouraged to attend Pulmonary Rehabilitation for the exercise;Relaxation education;Stress management education    Continue Psychosocial Services  No Follow up required            Education: Education Goals: Education classes will be provided on a weekly basis, covering required topics. Participant will state understanding/return demonstration of topics presented.  Learning Barriers/Preferences:  Learning Barriers/Preferences - 09/23/19 1141      Learning Barriers/Preferences   Learning Barriers None    Learning Preferences Audio;Computer/Internet;Group Instruction;Individual Instruction;Pictoral;Skilled Demonstration;Verbal Instruction;Video;Written Material           Education Topics: How Lungs Work and Diseases: - Discuss the anatomy of the lungs and diseases that can affect the lungs, such as COPD.    PULMONARY REHAB OTHER RESPIRATORY  from 12/17/2019 in [place]  Date 11/19/19  Educator Etheleen Mayhew  Instruction Review Code 1- Verbalizes Understanding      Exercise: -Discuss the importance of exercise, FITT principles of exercise, normal and abnormal responses to exercise, and how to exercise safely.   Environmental Irritants: -Discuss types of environmental irritants and how to limit exposure to environmental irritants.   PULMONARY REHAB OTHER RESPIRATORY from 12/17/2019 in [place]  Date 11/26/19  Educator Etheleen Mayhew  Instruction Review Code 1- Verbalizes Understanding      Meds/Inhalers and oxygen: - Discuss respiratory medications, definition of an inhaler and oxygen, and the proper way to use an inhaler and oxygen.   Energy Saving Techniques: - Discuss methods to conserve energy and decrease shortness of breath when performing activities of daily living.    PULMONARY REHAB OTHER RESPIRATORY from 12/17/2019 in [place]  Date 12/10/19  Educator [place]  Instruction Review Code 1- Verbalizes Understanding      Bronchial Hygiene / Breathing Techniques: - Discuss breathing mechanics, pursed-lip breathing technique,  proper posture, effective ways to clear airways, and other functional breathing techniques   PULMONARY REHAB OTHER RESPIRATORY from 12/17/2019 in [place]  Date 12/17/19  Educator [place]  Instruction Review Code 1- Water engineer: - Provides group verbal and written instruction about the health risks of elevated stress, cause of high stress, and healthy ways to reduce stress.   PULMONARY REHAB OTHER RESPIRATORY from 12/17/2019 in [place]  Date 09/24/19  Educator Etheleen Mayhew  Instruction Review Code 1- Verbalizes Understanding      Nutrition I: Fats: - Discuss the types of cholesterol, what cholesterol does to the  body, and how cholesterol levels can be controlled.   PULMONARY REHAB OTHER RESPIRATORY from 12/17/2019 in [place]  Date 10/01/19  Educator Etheleen Mayhew  Instruction Review Code 1- Verbalizes Understanding      Nutrition II: Labels: -Discuss the different components of food labels and how to read food labels.   PULMONARY REHAB OTHER RESPIRATORY from 12/17/2019 in [place]  Date 10/08/19  Educator D. Coad  Instruction Review Code 1- Verbalizes Understanding      Respiratory Infections: - Discuss the signs and symptoms of respiratory infections, ways to prevent respiratory infections, and the importance of seeking medical treatment when having a respiratory infection.   Stress I: Signs and Symptoms: - Discuss the causes of stress, how stress may lead to anxiety and depression, and ways to limit stress.   Stress II: Relaxation: -Discuss relaxation techniques to limit stress.   PULMONARY REHAB OTHER RESPIRATORY from 12/17/2019 in [place]  Date 10/29/19  Educator DF  Instruction Review Code 2- Demonstrated Understanding      Oxygen for Home/Travel: - Discuss how to prepare for travel when on oxygen and proper ways to transport and store oxygen to ensure safety.   PULMONARY REHAB OTHER RESPIRATORY from 12/17/2019 in [place]  Date 11/05/19  Educator [place]  Instruction Review Code 1- Verbalizes Understanding      Knowledge Questionnaire Score:  Knowledge Questionnaire Score - 09/23/19 1141      Knowledge Questionnaire Score   Pre Score 14/18           Core Components/Risk Factors/Patient Goals at Admission:  Personal Goals and Risk Factors at Admission - 09/23/19 1142      Core Components/Risk Factors/Patient Goals  on Admission    Weight Management Yes    Intervention Weight Management/Obesity: Establish reasonable short term and long term weight goals.    Admit Weight 311 lb  4.8 oz (141.2 kg)    Goal Weight: Short Term 286 lb 4.8 oz (129.9 kg)    Goal Weight: Long Term 236 lb 4.8 oz (107.2 kg)    Expected Outcomes Short Term: Continue to assess and modify interventions until short term weight is achieved;Long Term: Adherence to nutrition and physical activity/exercise program aimed toward attainment of established weight goal    Personal Goal Other Yes    Personal Goal Get lungs stronger, Lose weight, get off oxygen, be healthier overall.    Intervention Attend class 2 x week and to supplement with at home exercise plan that was given 3 x week.    Expected Outcomes Reach expected goals.           Core Components/Risk Factors/Patient Goals Review:   Goals and Risk Factor Review    Row Name 10/20/19 1244 11/18/19 0947 12/16/19 1428 01/13/20 1344       Core Components/Risk Factors/Patient Goals Review   Personal Goals Review Weight Management/Obesity  Get stronger; be healthier. Weight Management/Obesity;Improve shortness of breath with ADL's  Get lungs stronger; be healthier. Weight Management/Obesity;Improve shortness of breath with ADL's  Get lungs stronger; be healthier. Weight Management/Obesity;Improve shortness of breath with ADL's  Get lungs stronger; be healthier.    Review Patient has completed 8 sessions gaining 2 bls since her initial visit. She is doing well in the program with consistent attendance. She says she is starting to feel stronger now and has a little more energy. She feels her lungs are getting stronger. She enjoys getting out and have a routine exercise time. She feels like she is making progress toward meeting her personal goals. Will continue to monitor for progress. Patient has completed 16 sessions gaining 1 lb since last 30 day review. She continues to do well in the program with progression and consistent attendance. She continues to say she is feeling stronger and has more energy. She says her lungs are getting stronger and she continues  to enjoy exercising. Will continue to monitor for progress. Patient has completed 23 sessions losing 4 lbs. She continues to do well in the program with progression and consistent attendance. She says she really enjoys coming here to exercise. She reports feeling stronger and having more energy to keep up with her teenage children. She says she feels like her lungs are getting stronger and she is getting back to where she was before COVID. Will continue to montior for progress. Patient has completed 26 sessions losing 2 lbs since last 30 day review. She continues to do well in the program with progression. She has been absent since 12/24/19 due MD appointments and getting her children settled starting back to school. She continues to go to [employer] 3 day/week in addition to PR. She says she feels stronger and in better shape than she did prior to getting COVID. She has more energy and is losing weight and is very pleased with her progress in the program. Will continue to monitor for progress.    Expected Outcomes Patient will continue to attend sessions and complete the program meeting both personal and program goals. Patient will continue to attend sessions and complete the program meeting both personal and program goals. Patient will continue to attend sessions and complete the program meeting both personal and program goals. Patient  will continue to attend sessions and complete the program meeting both personal and program goals.           Core Components/Risk Factors/Patient Goals at Discharge (Final Review):   Goals and Risk Factor Review - 01/13/20 1344      Core Components/Risk Factors/Patient Goals Review   Personal Goals Review Weight Management/Obesity;Improve shortness of breath with ADL's   Get lungs stronger; be healthier.   Review Patient has completed 26 sessions losing 2 lbs since last 30 day review. She continues to do well in the program with progression. She has been absent since  12/24/19 due MD appointments and getting her children settled starting back to school. She continues to go to [employer] 3 day/week in addition to PR. She says she feels stronger and in better shape than she did prior to getting COVID. She has more energy and is losing weight and is very pleased with her progress in the program. Will continue to monitor for progress.    Expected Outcomes Patient will continue to attend sessions and complete the program meeting both personal and program goals.           ITP Comments:  ITP Comments    Row Name 09/23/19 1131           ITP Comments Patient is being sent by her pulmonary doctor due to having COVID-19. she is eager to get started and get stronger. Her goals are to gain lung strength, come off oxygen, and just have better health.              Comments: ITP REVIEW Pt is making expected progress toward pulmonary rehab goals after completing 26 sessions. Recommend continued exercise, life style modification, education, and utilization of breathing techniques to increase stamina and strength and decrease shortness of breath with exertion.

## 2020-01-19 ENCOUNTER — Other Ambulatory Visit: Payer: Self-pay

## 2020-01-19 ENCOUNTER — Encounter (HOSPITAL_COMMUNITY)
Admission: RE | Admit: 2020-01-19 | Discharge: 2020-01-19 | Disposition: A | Discharge status: Home / Self Care | Payer: Commercial Managed Care - PPO | Source: Ambulatory Visit | Attending: Internal Medicine | Admitting: Internal Medicine

## 2020-01-19 VITALS — Wt 309.5 lb

## 2020-01-19 DIAGNOSIS — R0602 Shortness of breath: Secondary | ICD-10-CM | POA: Insufficient documentation

## 2020-01-19 NOTE — Progress Notes (Signed)
Daily Session Note  Patient Details  Name: Gina Conner MRN: [id number] Date of Birth: 19-Jun-1965 Referring Provider:     PULMONARY REHAB OTHER RESP ORIENTATION from 09/23/2019 in [place]  Referring Provider Dr. Shearon Stalls      Encounter Date: 01/19/2020  Check In:  Session Check In - 01/19/20 1045      Check-In   Supervising physician immediately available to respond to emergencies See telemetry face sheet for immediately available MD    Location [facility]    Staff Present Aundra Dubin, RN, Bjorn Loser, MS, ACSM-CEP, Exercise Physiologist    Virtual Visit No    Medication changes reported     No    Fall or balance concerns reported    No    Tobacco Cessation No Change    Warm-up and Cool-down Performed as group-led instruction    Resistance Training Performed Yes    VAD Patient? No    PAD/SET Patient? No      Pain Assessment   Currently in Pain? No/denies    Pain Score 0-No pain    Multiple Pain Sites No           Capillary Blood Glucose: No results found for this or any previous visit (from the past 24 hour(s)).    Social History   Tobacco Use  Smoking Status Never Smoker  Smokeless Tobacco Never Used    Goals Met:  Proper associated with RPD/PD & O2 Sat Independence with exercise equipment Improved SOB with ADL's Using PLB without cueing & demonstrates good technique Exercise tolerated well No report of cardiac concerns or symptoms Strength training completed today  Goals Unmet:  Not Applicable  Comments: Check out 1145.   Dr. Kathie Dike is Medical Director for [facility] Pul[facility].

## 2020-01-21 ENCOUNTER — Encounter (HOSPITAL_COMMUNITY)
Admission: RE | Admit: 2020-01-21 | Discharge: 2020-01-21 | Disposition: A | Discharge status: Home / Self Care | Payer: Commercial Managed Care - PPO | Source: Ambulatory Visit | Attending: Internal Medicine | Admitting: Internal Medicine

## 2020-01-21 ENCOUNTER — Other Ambulatory Visit: Payer: Self-pay

## 2020-01-21 DIAGNOSIS — R0602 Shortness of breath: Secondary | ICD-10-CM | POA: Diagnosis not present

## 2020-01-21 NOTE — Progress Notes (Signed)
Daily Session Note  Patient Details  Name: Gina Conner MRN: [id number] Date of Birth: [birth year] Referring Provider:     PULMONARY REHAB OTHER RESP ORIENTATION from 09/23/2019 in [place]  Referring Provider Dr. Shearon Stalls      Encounter Date: 01/21/2020  Check In:  Session Check In - 01/21/20 1048      Check-In   Supervising physician immediately available to respond to emergencies See telemetry face sheet for immediately available MD    Staff Present Ramon Dredge, RN, MHA;Dalton Kris Mouton, MS, ACSM-CEP, Exercise Physiologist    Virtual Visit No    Medication changes reported     No    Fall or balance concerns reported    No    Tobacco Cessation No Change    Warm-up and Cool-down Performed as group-led instruction    Resistance Training Performed Yes    VAD Patient? No    PAD/SET Patient? No      Pain Assessment   Currently in Pain? No/denies    Pain Score 0-No pain           Capillary Blood Glucose: No results found for this or any previous visit (from the past 24 hour(s)).    Social History   Tobacco Use  Smoking Status Never Smoker  Smokeless Tobacco Never Used    Goals Met:  Proper associated with RPD/PD & O2 Sat Independence with exercise equipment Improved SOB with ADL's Using PLB without cueing & demonstrates good technique Exercise tolerated well No report of cardiac concerns or symptoms Strength training completed today  Goals Unmet:  Not Applicable  Comments: [id number]   Dr. Kathie Dike is Medical Director for [facility].

## 2020-01-26 ENCOUNTER — Other Ambulatory Visit: Payer: Self-pay

## 2020-01-26 ENCOUNTER — Encounter (HOSPITAL_COMMUNITY)
Admission: RE | Admit: 2020-01-26 | Discharge: 2020-01-26 | Disposition: A | Discharge status: Home / Self Care | Payer: Commercial Managed Care - PPO | Source: Ambulatory Visit | Attending: Internal Medicine | Admitting: Internal Medicine

## 2020-01-26 DIAGNOSIS — R0602 Shortness of breath: Secondary | ICD-10-CM

## 2020-01-26 NOTE — Progress Notes (Signed)
Daily Session Note  Patient Details  Name: Gina Conner MRN: [id number] Date of Birth: 02/08/1966 Referring Provider:     PULMONARY REHAB OTHER RESP ORIENTATION from 09/23/2019 in [place]  Referring Provider Dr. Shearon Stalls      Encounter Date: 01/26/2020  Check In:  Session Check In - 01/26/20 1107      Check-In   Supervising physician immediately available to respond to emergencies See telemetry face sheet for immediately available MD    Location [facility]    Staff Present Geanie Cooley, Felipe Drone, RN, [facility]    Virtual Visit No    Medication changes reported     No    Fall or balance concerns reported    No    Tobacco Cessation No Change    Warm-up and Cool-down Performed as group-led instruction    Resistance Training Performed Yes    VAD Patient? No    PAD/SET Patient? No      Pain Assessment   Currently in Pain? Yes    Pain Score 5     Pain Location Foot    Pain Orientation Left    Pain Descriptors / Indicators Aching    Pain Type Chronic pain    Multiple Pain Sites No           Capillary Blood Glucose: No results found for this or any previous visit (from the past 24 hour(s)).    Social History   Tobacco Use  Smoking Status Never Smoker  Smokeless Tobacco Never Used    Goals Met:  Proper associated with RPD/PD & O2 Sat Independence with exercise equipment Improved SOB with ADL's Using PLB without cueing & demonstrates good technique Exercise tolerated well No report of cardiac concerns or symptoms Strength training completed today  Goals Unmet:  Not Applicable  Comments: [id number]  Dr. Kathie Dike is Medical Director for [facility].

## 2020-01-28 ENCOUNTER — Ambulatory Visit (INDEPENDENT_AMBULATORY_CARE_PROVIDER_SITE_OTHER): Payer: Commercial Managed Care - PPO | Admitting: Nurse Practitioner

## 2020-01-28 ENCOUNTER — Other Ambulatory Visit: Payer: Self-pay

## 2020-01-28 ENCOUNTER — Encounter (HOSPITAL_COMMUNITY): Payer: Commercial Managed Care - PPO

## 2020-01-28 VITALS — Ht 67.0 in

## 2020-01-28 DIAGNOSIS — Z8616 Personal history of COVID-19: Secondary | ICD-10-CM | POA: Diagnosis not present

## 2020-01-28 DIAGNOSIS — M791 Myalgia, unspecified site: Secondary | ICD-10-CM | POA: Insufficient documentation

## 2020-01-28 DIAGNOSIS — R52 Pain, unspecified: Secondary | ICD-10-CM

## 2020-01-28 DIAGNOSIS — R413 Other amnesia: Secondary | ICD-10-CM

## 2020-01-28 DIAGNOSIS — R200 Anesthesia of skin: Secondary | ICD-10-CM

## 2020-01-28 DIAGNOSIS — R519 Headache, unspecified: Secondary | ICD-10-CM

## 2020-01-28 NOTE — Progress Notes (Signed)
@Patient  ID: Mackey Birchwood, female    DOB: 22-Sep-1965, 54 y.o.   MRN: [id number]  Chief Complaint  Patient presents with  . Post COVID    Positive 04/2019; Sx Headaches, Memory issues, lower body aches. Toes and fingers to upper arm numbness    Referring provider: Manon Hilding, MD   54 year old female with past history of DVT.  Diagnosed with Covid 05/03/2019.  HPI   Patient presents today for post COVID care clinic visit.  She tested positive in December 2020 and was hospitalized.  She states that since that time she continues to have ongoing issues.  She complains today of some ongoing shortness of breath, headaches, numbness to right hand and right foot, and memory loss.  She also complains of ongoing hoarse voice.  She complains of pain throughout her body but especially in her lower extremities.  She is trying to stay active.  She states that she does get fatigued quickly when trying to exercise. Patient needs new PCP.  Patient has been to see pulmonary but does need a follow-up appointment.  Patient states she will call to make this appointment.  Denies f/c/s, n/v/d, hemoptysis, PND, chest pain or edema.       No Known Allergies   There is no immunization history on file for this patient.  Past Medical History:  Diagnosis Date  . DVT (deep venous thrombosis) ([place])    provoked during tendon surgery was treated 3 months of coumadin  . Hypothyroidism     Tobacco History: Social History   Tobacco Use  Smoking Status Never Smoker  Smokeless Tobacco Never Used   Counseling given: Yes   Outpatient Encounter Medications as of 01/28/2020  Medication Sig  . acetaminophen (TYLENOL) 500 MG tablet Take 1,000 mg by mouth every 6 (six) hours as needed for mild pain or headache.   . albuterol (VENTOLIN HFA) 108 (90 Base) MCG/ACT inhaler Inhale 2 puffs into the lungs every 6 (six) hours as needed for wheezing or shortness of breath.  . ALPRAZolam (XANAX) 0.5 MG tablet Take  0.5 mg by mouth at bedtime as needed for sleep.  . Cholecalciferol (D 5000) 5000 units capsule Take 5,000 Units by mouth daily.  Marland Kitchen ipratropium-albuterol (DUONEB) 0.5-2.5 (3) MG/3ML SOLN Take 3 mLs by nebulization every 6 (six) hours as needed (shortness of breath).  Marland Kitchen levothyroxine (SYNTHROID) 88 MCG tablet Take 88 mcg by mouth daily before breakfast. Pt unsure of strength.   . spironolactone (ALDACTONE) 25 MG tablet Take 25 mg by mouth at bedtime.  . progesterone (PROMETRIUM) 100 MG capsule Take 100 mg by mouth at bedtime.   No facility-administered encounter medications on file as of 01/28/2020.     Review of Systems  Review of Systems  Constitutional: Positive for fatigue. Negative for fever.  HENT: Negative.   Respiratory: Positive for cough and shortness of breath.   Cardiovascular: Negative.  Negative for chest pain, palpitations and leg swelling.  Gastrointestinal: Negative.   Musculoskeletal: Positive for myalgias.  Allergic/Immunologic: Negative.   Neurological: Positive for headaches.       Memory loss, numbness to right hand and right foot  Psychiatric/Behavioral: Positive for decreased concentration.       Physical Exam  Ht 5\' 7"  (1.702 m)   BMI 48.48 kg/m   Wt Readings from Last 5 Encounters:  01/19/20 (!) 309 lb 8.4 oz (140.4 kg)  12/22/19 (!) 307 lb 12.2 oz (139.6 kg)  12/03/19 (!) 315 lb 4.1 oz (  143 kg)  11/19/19 (!) 313 lb 15 oz (142.4 kg)  11/05/19 (!) 314 lb 13.1 oz (142.8 kg)     Physical Exam Vitals and nursing note reviewed.  Constitutional:      General: She is not in acute distress.    Appearance: She is well-developed.  Cardiovascular:     Rate and Rhythm: Normal rate and regular rhythm.  Pulmonary:     Effort: Pulmonary effort is normal.     Breath sounds: Normal breath sounds.  Musculoskeletal:     Right lower leg: No edema.     Left lower leg: No edema.  Neurological:     Mental Status: She is alert and oriented to person, place, and  time.  Psychiatric:        Mood and Affect: Mood normal.        Behavior: Behavior normal.        Assessment & Plan:   History of COVID-19 Memory loss Numbness to right hand and foot Headaches:  Will place referral to neurology  May start magnesium 600 mg daily for headache  Will give handout on memory loss  Will give handout on headache log   Body aches:  Stay active  Will order labs   Will place referral to PMR  Stay well hydrated  May start vitamin C 2,000 mg daily, vitamin D3 2,000 IU daily, Zinc 220 mg daily, and Quercetin 500 mg twice daily   Follow up:  Follow up in 1 month or sooner if needed     Fenton Foy, NP 01/28/2020

## 2020-01-28 NOTE — Assessment & Plan Note (Signed)
Memory loss Numbness to right hand and foot Headaches:  Will place referral to neurology  May start magnesium 600 mg daily for headache  Will give handout on memory loss  Will give handout on headache log   Body aches:  Stay active  Will order labs   Will place referral to PMR  Stay well hydrated  May start vitamin C 2,000 mg daily, vitamin D3 2,000 IU daily, Zinc 220 mg daily, and Quercetin 500 mg twice daily   Follow up:  Follow up in 1 month or sooner if needed

## 2020-01-28 NOTE — Patient Instructions (Addendum)
History of covid Memory loss Numbness to right hand and foot Headaches:  Will place referral to neurology  May start magnesium 600 mg daily for headache  Will give handout on memory loss  Will give handout on headache log   Body aches:  Stay active  Will order labs   Will place referral to PMR  Stay well hydrated  May start vitamin C 2,000 mg daily, vitamin D3 2,000 IU daily, Zinc 220 mg daily, and Quercetin 500 mg twice daily   Follow up:  Follow up in 1 month or sooner if needed

## 2020-01-29 LAB — SEDIMENTATION RATE: Sed Rate: 2 mm/hr (ref 0–40)

## 2020-01-29 LAB — COMPREHENSIVE METABOLIC PANEL
ALT: 14 IU/L (ref 0–32)
AST: 18 IU/L (ref 0–40)
Albumin/Globulin Ratio: 2.2 (ref 1.2–2.2)
Albumin: 4.4 g/dL (ref 3.8–4.9)
Alkaline Phosphatase: 55 IU/L (ref 44–121)
BUN/Creatinine Ratio: 24 — ABNORMAL HIGH (ref 9–23)
BUN: 14 mg/dL (ref 6–24)
Bilirubin Total: 0.5 mg/dL (ref 0.0–1.2)
CO2: 24 mmol/L (ref 20–29)
Calcium: 9.4 mg/dL (ref 8.7–10.2)
Chloride: 102 mmol/L (ref 96–106)
Creatinine, Ser: 0.58 mg/dL (ref 0.57–1.00)
GFR calc Af Amer: 122 mL/min/{1.73_m2} (ref >59)
GFR calc non Af Amer: 106 mL/min/{1.73_m2} (ref >59)
Globulin, Total: 2 g/dL (ref 1.5–4.5)
Glucose: 107 mg/dL — ABNORMAL HIGH (ref 65–99)
Potassium: 4.8 mmol/L (ref 3.5–5.2)
Sodium: 141 mmol/L (ref 134–144)
Total Protein: 6.4 g/dL (ref 6.0–8.5)

## 2020-01-29 LAB — CBC
Hematocrit: 40 % (ref 34.0–46.6)
Hemoglobin: 13 g/dL (ref 11.1–15.9)
MCH: 28.4 pg (ref 26.6–33.0)
MCHC: 32.5 g/dL (ref 31.5–35.7)
MCV: 87 fL (ref 79–97)
Platelets: 331 10*3/uL (ref 150–450)
RBC: 4.58 x10E6/uL (ref 3.77–5.28)
RDW: 13.2 % (ref 11.7–15.4)
WBC: 8.6 10*3/uL (ref 3.4–10.8)

## 2020-01-29 LAB — VITAMIN B12: Vitamin B-12: 651 pg/mL (ref 232–1245)

## 2020-01-29 LAB — ANA: Anti Nuclear Antibody (ANA): NEGATIVE

## 2020-01-29 LAB — RHEUMATOID FACTOR: Rheumatoid fact SerPl-aCnc: <10 IU/mL (ref 0.0–13.9)

## 2020-01-29 LAB — VITAMIN D 25 HYDROXY (VIT D DEFICIENCY, FRACTURES): Vit D, 25-Hydroxy: 54.3 ng/mL (ref 30.0–100.0)

## 2020-02-01 ENCOUNTER — Encounter: Payer: Self-pay | Admitting: Physical Medicine & Rehabilitation

## 2020-02-02 ENCOUNTER — Other Ambulatory Visit: Payer: Self-pay

## 2020-02-02 ENCOUNTER — Encounter (HOSPITAL_COMMUNITY)
Admission: RE | Admit: 2020-02-02 | Discharge: 2020-02-02 | Disposition: A | Discharge status: Home / Self Care | Payer: Commercial Managed Care - PPO | Source: Ambulatory Visit | Attending: Internal Medicine | Admitting: Internal Medicine

## 2020-02-02 VITALS — Wt 308.6 lb

## 2020-02-02 DIAGNOSIS — R0602 Shortness of breath: Secondary | ICD-10-CM

## 2020-02-02 NOTE — Progress Notes (Signed)
Daily Session Note  Patient Details  Name: Gina Conner MRN: [id number] Date of Birth: 1966/01/04 Referring Provider:     PULMONARY REHAB OTHER RESP ORIENTATION from 09/23/2019 in [place]  Referring Provider Dr. Shearon Stalls      Encounter Date: 02/02/2020  Check In:  Session Check In - 02/02/20 1045      Check-In   Supervising physician immediately available to respond to emergencies See telemetry face sheet for immediately available MD    Location [facility]    Staff Present Geanie Cooley, Felipe Drone, RN, [facility]    Virtual Visit No    Medication changes reported     No    Fall or balance concerns reported    No    Tobacco Cessation No Change    Warm-up and Cool-down Performed as group-led instruction    Resistance Training Performed Yes    VAD Patient? No    PAD/SET Patient? No      Pain Assessment   Currently in Pain? No/denies    Pain Score 6     Pain Location Ankle    Pain Orientation Left    Pain Descriptors / Indicators Aching    Pain Type Chronic pain    Multiple Pain Sites No   hurts form hips down to feet          Capillary Blood Glucose: No results found for this or any previous visit (from the past 24 hour(s)).    Social History   Tobacco Use  Smoking Status Never Smoker  Smokeless Tobacco Never Used    Goals Met:  Proper associated with RPD/PD & O2 Sat Independence with exercise equipment Improved SOB with ADL's Using PLB without cueing & demonstrates good technique Exercise tolerated well Personal goals reviewed No report of cardiac concerns or symptoms Strength training completed today  Goals Unmet:  Not Applicable  Comments: check out @ 11:45   Dr. Kathie Dike is Medical Director for [facility].

## 2020-02-04 ENCOUNTER — Encounter (HOSPITAL_COMMUNITY)
Admission: RE | Admit: 2020-02-04 | Discharge: 2020-02-04 | Disposition: A | Discharge status: Home / Self Care | Payer: Commercial Managed Care - PPO | Source: Ambulatory Visit | Attending: Internal Medicine | Admitting: Internal Medicine

## 2020-02-04 ENCOUNTER — Other Ambulatory Visit: Payer: Self-pay

## 2020-02-04 DIAGNOSIS — R0602 Shortness of breath: Secondary | ICD-10-CM | POA: Diagnosis not present

## 2020-02-04 NOTE — Progress Notes (Signed)
Daily Session Note  Patient Details  Name: Gina Conner MRN: [id number] Date of Birth: 05/28/1965 Referring Provider:     PULMONARY REHAB OTHER RESP ORIENTATION from 09/23/2019 in [place]  Referring Provider Dr. Shearon Stalls      Encounter Date: 02/04/2020  Check In:  Session Check In - 02/04/20 1045      Check-In   Supervising physician immediately available to respond to emergencies See telemetry face sheet for immediately available MD    Location [facility]    Staff Present Hoy Register, MS, ACSM-CEP, Exercise Physiologist;Debra Wynetta Emery, RN, BSN    Virtual Visit No    Medication changes reported     No    Fall or balance concerns reported    No    Tobacco Cessation No Change    Warm-up and Cool-down Performed as group-led instruction    Resistance Training Performed Yes    VAD Patient? No    PAD/SET Patient? No      Pain Assessment   Currently in Pain? No/denies    Pain Score 0-No pain    Multiple Pain Sites No           Capillary Blood Glucose: No results found for this or any previous visit (from the past 24 hour(s)).    Social History   Tobacco Use  Smoking Status Never Smoker  Smokeless Tobacco Never Used    Goals Met:  Independence with exercise equipment Exercise tolerated well No report of cardiac concerns or symptoms Strength training completed today  Goals Unmet:  Not Applicable  Comments: checkout time is 1145   Dr. Kathie Dike is Medical Director for [facility] Pulmonary Rehab.

## 2020-02-05 ENCOUNTER — Other Ambulatory Visit: Payer: Self-pay

## 2020-02-05 ENCOUNTER — Encounter: Payer: Self-pay | Admitting: Physical Medicine & Rehabilitation

## 2020-02-05 ENCOUNTER — Encounter
Payer: Commercial Managed Care - PPO | Attending: Physical Medicine & Rehabilitation | Admitting: Physical Medicine & Rehabilitation

## 2020-02-05 VITALS — BP 142/81 | HR 82 | Temp 98.4°F | Ht 67.0 in | Wt 305.4 lb

## 2020-02-05 DIAGNOSIS — M25562 Pain in left knee: Secondary | ICD-10-CM | POA: Diagnosis not present

## 2020-02-05 DIAGNOSIS — M25551 Pain in right hip: Secondary | ICD-10-CM | POA: Diagnosis not present

## 2020-02-05 DIAGNOSIS — M25552 Pain in left hip: Secondary | ICD-10-CM | POA: Diagnosis present

## 2020-02-05 DIAGNOSIS — M25561 Pain in right knee: Secondary | ICD-10-CM | POA: Diagnosis not present

## 2020-02-05 DIAGNOSIS — G8929 Other chronic pain: Secondary | ICD-10-CM | POA: Diagnosis present

## 2020-02-05 DIAGNOSIS — M7662 Achilles tendinitis, left leg: Secondary | ICD-10-CM | POA: Insufficient documentation

## 2020-02-05 DIAGNOSIS — M7661 Achilles tendinitis, right leg: Secondary | ICD-10-CM | POA: Diagnosis not present

## 2020-02-05 NOTE — Patient Instructions (Signed)
Achilles Tendinitis Rehab Ask your health care provider which exercises are safe for you. Do exercises exactly as told by your health care provider and adjust them as directed. It is normal to feel mild stretching, pulling, tightness, or discomfort as you do these exercises. Stop right away if you feel sudden pain or your pain gets worse. Do not begin these exercises until told by your health care provider. Stretching and range-of-motion exercises These exercises warm up your muscles and joints and improve the movement and flexibility of your ankle. These exercises also help to relieve pain. Standing wall calf stretch with straight knee  1. Stand with your hands against a wall. 2. Extend your left / right leg behind you, and bend your front knee slightly. Keep both of your heels on the floor. 3. Point the toes of your back foot slightly inward. 4. Keeping your heels on the floor and your back knee straight, shift your weight toward the wall. Do not allow your back to arch. You should feel a gentle stretch in your upper calf. 5. Hold this position for __________ seconds. Repeat __________ times. Complete this exercise __________ times a day. Standing wall calf stretch with bent knee 1. Stand with your hands against a wall. 2. Extend your left / right leg behind you, and bend your front knee slightly. Keep both of your heels on the floor. 3. Point the toes of your back foot slightly inward. 4. Keeping your heels on the floor, bend your back knee slightly. You should feel a gentle stretch deep in your lower calf near your heel. 5. Hold this position for __________ seconds. Repeat __________ times. Complete this exercise __________ times a day. Strengthening exercises These exercises build strength and control of your ankle. Endurance is the ability to use your muscles for a long time, even after they get tired. Plantar flexion with band In this exercise, you push your toes downward, away from you,  with an exercise band providing resistance. 1. Sit on the floor with your left / right leg extended. You may put a pillow under your calf to give your foot more room to move. 2. Loop a rubber exercise band or tube around the ball of your left / right foot. The ball of your foot is on the walking surface, right under your toes. The band or tube should be slightly tense when your foot is relaxed. If the band or tube slips, you can put on your shoe or put a washcloth between the band and your foot to help it stay in place. 3. Slowly point your toes downward, pushing them away from you (plantar flexion). 4. Hold this position for __________ seconds. 5. Slowly release the tension in the band or tube, controlling smoothly until your foot is back to the starting position. 6. Repeat steps 1-5 with your left / right leg. Repeat __________ times. Complete this exercise __________ times a day. Eccentric heel drop  In this exercise, you stand and slowly raise your heel and then slowly lower it. This exercise lengthens the calf muscles (eccentric) while the heel bears weight. If this exercise is too easy, try doing it while wearing a backpack with weights in it. 1. Stand on a step with the balls of your feet. The ball of your foot is on the walking surface, right under your toes. ? Do not put your heels on the step. ? For balance, rest your hands on the wall or on a railing. 2. Rise up onto   the balls of your feet. 3. Keeping your heels up, shift all of your weight to your left / right leg and pick up your other leg. 4. Slowly lower your left / right leg so your heel drops below the level of the step. 5. Put down your other foot before returning to the start position. If told by your health care provider, build up to: ? 3 sets of 15 repetitions while keeping your knees straight. ? 3 sets of 15 repetitions while keeping your knees slightly bent as far as told by your health care provider. Repeat __________  times. Complete this exercise __________ times a day. Balance exercises These exercises improve or maintain your balance. Balance is important in preventing falls. Single leg stand If this exercise is too easy, you can try it with your eyes closed or while standing on a pillow. 1. Without shoes, stand near a railing or in a door frame. Hold on to the railing or door frame as needed. 2. Stand on your left / right foot. Keep your big toe down on the floor and try to keep your arch lifted. 3. Hold this position for __________ seconds. Repeat __________ times. Complete this exercise __________ times a day. This information is not intended to replace advice given to you by your health care provider. Make sure you discuss any questions you have with your health care provider. Document Revised: 08/18/2018 Document Reviewed: 02/10/2018 Elsevier Patient Education  2020 Elsevier Inc.   

## 2020-02-05 NOTE — Progress Notes (Signed)
Subjective:    Patient ID: Gina Conner, female    DOB: [birth year], 54 y.o.   MRN: [id number]  HPI CC:  Pain from hips down  Onset ~28yrs no fall or injury at that time, retired from [employer] position July 2020. Covid 19 in Dec 2020 with 8 day hospitalization but did not require intubation . Treated with Remdesivir and steroids.  Came home an dstayed on prednisone for ~51month  Off O2 since March , still doing pulmonary rehab at [facility].   ModI with all activity .  Staining home and other projects.  Aching burning pain in lateral hips, both knees and ankles.  Had some UE tingling R hand improved .   Has been followed by pulmonary with normal PFTs in June 2021  Hx Right calcific bursitis Right hip s/p bursectomy per Dr Maureen Ralphs 2018.   Pain Inventory Average Pain 8 Pain Right Now 4 My pain is intermittent, constant, sharp, burning and aching  In the last 24 hours, has pain interfered with the following? General activity 2 Relation with others 0 Enjoyment of life 0 What TIME of day is your pain at its worst? morning  and night Sleep (in general) Poor  Pain is worse with: walking, sitting and some activites Pain improves with: nothing, just keeps moving Relief from Meds: na  walk without assistance ability to climb steps?  yes do you drive?  yes  retired  weakness numbness spasms anxiety  Any changes since last visit?  no  Any changes since last visit?  no Primary care Lazaro Arms NP [place] MD   Exercises at least 3 x week--Goes to pulmonary rehab at [facility], and goes to [employer] works on strength with wts and walks on treadmill and rides bike. She just tries to keep going in spite of discomfort,   Family History  Problem Relation Age of Onset  . Pulmonary embolism Mother    Social History   Socioeconomic History  . Marital status: Married    Spouse name: Not on file  . Number of children: Not on file  . Years  of education: Not on file  . Highest education level: Not on file  Occupational History  . Not on file  Tobacco Use  . Smoking status: Never Smoker  . Smokeless tobacco: Never Used  Substance and Sexual Activity  . Alcohol use: No    Comment: occ  . Drug use: No  . Sexual activity: Yes  Other Topics Concern  . Not on file  Social History Narrative  . Not on file   Social Determinants of Health   Financial Resource Strain:   . Difficulty of Paying Living Expenses: Not on file  Food Insecurity:   . Worried About Charity fundraiser in the Last Year: Not on file  . Ran Out of Food in the Last Year: Not on file  Transportation Needs:   . Lack of Transportation (Medical): Not on file  . Lack of Transportation (Non-Medical): Not on file  Physical Activity:   . Days of Exercise per Week: Not on file  . Minutes of Exercise per Session: Not on file  Stress:   . Feeling of Stress : Not on file  Social Connections:   . Frequency of Communication with Friends and Family: Not on file  . Frequency of Social Gatherings with Friends and Family: Not on file  . Attends Religious Services: Not on file  . Active Member  of Clubs or Organizations: Not on file  . Attends Archivist Meetings: Not on file  . Marital Status: Not on file   Past Surgical History:  Procedure Laterality Date  . ABDOMINAL HYSTERECTOMY    . ACHILLES TENDON REPAIR    . EXCISION/RELEASE BURSA HIP Right 02/06/2017   Procedure: Right hip bursectomy;  Surgeon: Gaynelle Arabian, MD;  Location: [facility];  Service: Orthopedics;  Laterality: Right;  . ROUX-EN-Y GASTRIC BYPASS  2002   Past Medical History:  Diagnosis Date  . DVT (deep venous thrombosis) ([place])    provoked during tendon surgery was treated 3 months of coumadin  . Hypothyroidism    BP (!) 142/81   Pulse 82   Temp 98.4 F (36.9 C)   Ht 5\' 7"  (1.702 m)   Wt (!) 305 lb 6.4 oz (138.5 kg)   SpO2 98%   BMI 47.83 kg/m   Opioid Risk Score:   Fall  Risk Score:  `1  Depression screen PHQ 2/9  Depression screen [facility] 2/9 02/05/2020 01/28/2020 09/23/2019  Decreased Interest 1 0 0  Down, Depressed, Hopeless 0 0 1  PHQ - 2 Score 1 0 1  Altered sleeping 2 - 2  Tired, decreased energy 3 - 3  Change in appetite 2 - 1  Feeling bad or failure about yourself  0 - 1  Trouble concentrating 0 - 1  Moving slowly or fidgety/restless 0 - 0  Suicidal thoughts 0 - 0  PHQ-9 Score 8 - 9  Difficult doing work/chores Not difficult at all - Very difficult    Review of Systems  Constitutional: Negative.   HENT: Negative.   Eyes: Negative.   Respiratory: Positive for cough and shortness of breath.   Cardiovascular: Positive for leg swelling.       Right ankle  Gastrointestinal: Negative.   Endocrine: Negative.   Genitourinary: Negative.   Musculoskeletal: Positive for back pain, gait problem and myalgias.       Pain is low back and down legs mostly and right ankle/ some spasms  Skin: Negative.   Allergic/Immunologic: Negative.   Neurological: Positive for weakness.  Hematological: Negative.   Psychiatric/Behavioral: The patient is nervous/anxious.        Spent 8 days in hospital with covid and gets anxious in crowds  All other systems reviewed and are negative.      Objective:   Physical Exam Vitals and nursing note reviewed.  Constitutional:      Appearance: She is obese.  HENT:     Head: Normocephalic and atraumatic.  Eyes:     Extraocular Movements: Extraocular movements intact.     Conjunctiva/sclera: Conjunctivae normal.     Pupils: Pupils are equal, round, and reactive to light.  Cardiovascular:     Rate and Rhythm: Normal rate and regular rhythm.     Heart sounds: Normal heart sounds. No murmur heard.   Pulmonary:     Effort: Pulmonary effort is normal. No respiratory distress.     Breath sounds: Normal breath sounds. No stridor. No wheezing or rhonchi.  Abdominal:     General: Abdomen is flat. Bowel sounds are normal. There  is no distension.     Palpations: Abdomen is soft.     Tenderness: There is no abdominal tenderness.  Musculoskeletal:        General: Tenderness present.     Cervical back: Normal range of motion. No tenderness.     Comments: Pain to palpation bilateral knees medial and lateral  joint lines no pain over the patellar tendon or over the quadriceps tendon, no pain in the popliteal fossa or along the hamstrings tendons Patient has pain bilateral Achilles tendon near calcaneal insertion.  Negative Thompson's test bilaterally.  Mild pain with passive ankle dorsiflexion.  No ankle joint swelling or effusion.  There is no erythema at the ankle.  Pedal pulses are 2+ bilaterally. Negative straight leg raising bilaterally Lumbar spine without tenderness to palpation.  There is minimal tenderness over bilateral PSIS area there is tenderness over bilateral greater trochanters of the hip. There is no pain with hip internal rotation mild pain with external rotation in the groin area bilaterally.  Neurological:     Mental Status: She is alert and oriented to person, place, and time.     Gait: Gait normal.     Comments: Motor strength is 5/5 bilateral deltoid bicep tricep grip hip flexor knee extensor ankle dorsiflexor Sensation is intact bilateral C5 C6-C7-C8 L2-L3-L4 L5-S1 dermatomal distributions. Deep tendon reflexes are normal bilateral upper and lower limbs  Psychiatric:        Mood and Affect: Mood normal.        Behavior: Behavior normal.           Assessment & Plan:  #1.  Bilateral hip pain.  Pain is both lateral consistent with history of trochanteric bursitis.  She has undergone right hip bursectomy 3 years ago. 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  We will check x-rays. Referral to outpatient physical therapy Consider fluoroscopy guided intra-articular injections if  conservative care is not helping  #2.  Bilateral knee pain pain is along the joint lines.  No evidence of knee effusion or erythema.  No recent infections.  No tenderness along the quadriceps or patellar tendons.  Given her weight, at risk for osteoarthritis versus degenerative meniscal tears. Check x-rays standing bilateral knees Consider ultrasound-guided intra-articular injections if conservative care is not helping  #3.  Bilateral Achilles tendinitis, no exam evidence of tear.  Referral to physical therapy, Cam walker boot on the left side x4 to 6 weeks when up which is more symptomatic  Referral to outpatient physical therapy  #4.  Debility after Covid actually is back to a good functional level but likely has some muscle imbalance issues for the months that she was sedentary.  Physical medicine and rehabilitation follow-up in 4 weeks

## 2020-02-09 ENCOUNTER — Other Ambulatory Visit: Payer: Self-pay

## 2020-02-09 ENCOUNTER — Encounter (HOSPITAL_COMMUNITY)
Admission: RE | Admit: 2020-02-09 | Discharge: 2020-02-09 | Disposition: A | Discharge status: Home / Self Care | Payer: Commercial Managed Care - PPO | Source: Ambulatory Visit | Attending: Internal Medicine | Admitting: Internal Medicine

## 2020-02-09 DIAGNOSIS — R0602 Shortness of breath: Secondary | ICD-10-CM

## 2020-02-09 NOTE — Progress Notes (Signed)
Daily Session Note  Patient Details  Name: Gina Conner MRN: [id number] Date of Birth: May 30, 1965 Referring Provider:     PULMONARY REHAB OTHER RESP ORIENTATION from 09/23/2019 in [place]  Referring Provider Dr. Shearon Stalls      Encounter Date: 02/09/2020  Check In:  Session Check In - 02/09/20 1045      Check-In   Supervising physician immediately available to respond to emergencies See telemetry face sheet for immediately available MD    Location [facility]    Staff Present Geanie Cooley, Felipe Drone, RN, [facility]    Virtual Visit No    Medication changes reported     No    Fall or balance concerns reported    No    Tobacco Cessation No Change    Resistance Training Performed Yes    VAD Patient? No    PAD/SET Patient? No      Pain Assessment   Currently in Pain? Yes    Pain Score 5     Pain Location Foot    Pain Orientation Left    Pain Descriptors / Indicators Aching    Pain Type Chronic pain    Multiple Pain Sites No           Capillary Blood Glucose: No results found for this or any previous visit (from the past 24 hour(s)).    Social History   Tobacco Use  Smoking Status Never Smoker  Smokeless Tobacco Never Used    Goals Met:  Proper associated with RPD/PD & O2 Sat Independence with exercise equipment Improved SOB with ADL's Using PLB without cueing & demonstrates good technique Exercise tolerated well Personal goals reviewed No report of cardiac concerns or symptoms Strength training completed today  Goals Unmet:  Not Applicable  Comments: check out @ 11:45   Dr. Kathie Dike is Medical Director for [facility].

## 2020-02-11 ENCOUNTER — Other Ambulatory Visit: Payer: Self-pay | Admitting: Physical Medicine & Rehabilitation

## 2020-02-11 ENCOUNTER — Encounter (HOSPITAL_COMMUNITY)
Admission: RE | Admit: 2020-02-11 | Discharge: 2020-02-11 | Disposition: A | Discharge status: Home / Self Care | Payer: Commercial Managed Care - PPO | Source: Ambulatory Visit | Attending: Internal Medicine | Admitting: Internal Medicine

## 2020-02-11 ENCOUNTER — Other Ambulatory Visit: Payer: Self-pay

## 2020-02-11 ENCOUNTER — Ambulatory Visit (HOSPITAL_COMMUNITY)
Admission: RE | Admit: 2020-02-11 | Discharge: 2020-02-11 | Disposition: A | Discharge status: Home / Self Care | Payer: Commercial Managed Care - PPO | Source: Ambulatory Visit | Attending: Physical Medicine & Rehabilitation | Admitting: Physical Medicine & Rehabilitation

## 2020-02-11 DIAGNOSIS — M25552 Pain in left hip: Secondary | ICD-10-CM | POA: Diagnosis present

## 2020-02-11 DIAGNOSIS — M25561 Pain in right knee: Secondary | ICD-10-CM | POA: Insufficient documentation

## 2020-02-11 DIAGNOSIS — R0602 Shortness of breath: Secondary | ICD-10-CM | POA: Diagnosis not present

## 2020-02-11 DIAGNOSIS — M25562 Pain in left knee: Secondary | ICD-10-CM | POA: Insufficient documentation

## 2020-02-11 DIAGNOSIS — G8929 Other chronic pain: Secondary | ICD-10-CM

## 2020-02-11 DIAGNOSIS — M25551 Pain in right hip: Secondary | ICD-10-CM

## 2020-02-11 NOTE — Progress Notes (Signed)
Daily Session Note  Patient Details  Name: Gina Conner MRN: [id number] Date of Birth: 1965-10-04 Referring Provider:     PULMONARY REHAB OTHER RESP ORIENTATION from 09/23/2019 in [place]  Referring Provider Dr. Shearon Stalls      Encounter Date: 02/11/2020  Check In:  Session Check In - 02/11/20 1116      Check-In   Supervising physician immediately available to respond to emergencies See telemetry face sheet for immediately available MD    Location [facility]    Staff Present Ramon Dredge, RN, Madelaine Bhat, RN, BSN    Virtual Visit No    Medication changes reported     No    Fall or balance concerns reported    No    Tobacco Cessation No Change    Warm-up and Cool-down Performed as group-led instruction    Resistance Training Performed Yes    VAD Patient? No    PAD/SET Patient? No      Pain Assessment   Currently in Pain? Yes    Pain Score 5     Pain Location Foot    Pain Orientation Left    Pain Descriptors / Indicators Aching    Pain Onset 1 to 4 weeks ago    Multiple Pain Sites No           Capillary Blood Glucose: No results found for this or any previous visit (from the past 24 hour(s)).    Social History   Tobacco Use  Smoking Status Never Smoker  Smokeless Tobacco Never Used    Goals Met:  Proper associated with RPD/PD & O2 Sat Independence with exercise equipment Exercise tolerated well No report of cardiac concerns or symptoms Strength training completed today  Goals Unmet:  Not Applicable  Comments: [id number]   Dr. Kathie Dike is Medical Director for [facility].

## 2020-02-11 NOTE — Progress Notes (Signed)
Pulmonary Individual Treatment Plan  Patient Details  Name: Gina Conner MRN: [id number] Date of Birth: 01-01-1966 Referring Provider:     PULMONARY REHAB OTHER RESP ORIENTATION from 09/23/2019 in [facility]  Referring Provider Dr. Shearon Stalls      Initial Encounter Date:    [facility] from 09/23/2019 in [facility]  Date 09/23/19      Visit Diagnosis: SOB (shortness of breath)  Patient's Home Medications on Admission:   Current Outpatient Medications:  .  acetaminophen (TYLENOL) 500 MG tablet, Take 1,000 mg by mouth every 6 (six) hours as needed for mild pain or headache. , Disp: , Rfl:  .  albuterol (VENTOLIN HFA) 108 (90 Base) MCG/ACT inhaler, Inhale 2 puffs into the lungs every 6 (six) hours as needed for wheezing or shortness of breath., Disp: 6.7 g, Rfl: 0 .  ALPRAZolam (XANAX) 0.5 MG tablet, Take 0.5 mg by mouth at bedtime as needed for sleep., Disp: , Rfl:  .  Cholecalciferol (D 5000) 5000 units capsule, Take 5,000 Units by mouth daily., Disp: , Rfl:  .  ipratropium-albuterol (DUONEB) 0.5-2.5 (3) MG/3ML SOLN, Take 3 mLs by nebulization every 6 (six) hours as needed (shortness of breath)., Disp: 360 mL, Rfl: 3 .  levothyroxine (SYNTHROID) 88 MCG tablet, Take 88 mcg by mouth daily before breakfast. Pt unsure of strength. , Disp: , Rfl:  .  progesterone (PROMETRIUM) 100 MG capsule, Take 100 mg by mouth at bedtime., Disp: , Rfl:  .  spironolactone (ALDACTONE) 25 MG tablet, Take 25 mg by mouth at bedtime., Disp: , Rfl:   Past Medical History: Past Medical History:  Diagnosis Date  . DVT (deep venous thrombosis) ([place])    provoked during tendon surgery was treated 3 months of coumadin  . Hypothyroidism     Tobacco Use: Social History   Tobacco Use  Smoking Status Never Smoker  Smokeless Tobacco Never Used    Labs: Recent Review Flowsheet Data    Labs for ITP Cardiac and Pulmonary Rehab Latest Ref  Rng & Units 05/18/2019 05/22/2019 05/23/2019   Cholestrol 0 - 200 mg/dL - - 121   LDLCALC 0 - 99 mg/dL - - 68   HDL >40 mg/dL - - 31(L)   Trlycerides <150 mg/dL 126 - 108   Hemoglobin A1c 4.8 - 5.6 % - 6.2(H) -      Capillary Blood Glucose: No results found for: GLUCAP   Pulmonary Assessment Scores:  Pulmonary Assessment Scores    Row Name 09/23/19 1137         ADL UCSD   ADL Phase Entry     SOB Score total 53     Rest 0     Walk 10     Stairs 4     Bath 2     Dress 2     Shop 3       CAT Score   CAT Score 20       mMRC Score   mMRC Score 3           UCSD: Self-administered rating of dyspnea associated with activities of daily living (ADLs) 6-point scale (0 = "not at all" to 5 = "maximal or unable to do because of breathlessness")  Scoring Scores range from 0 to 120.  Minimally important difference is 5 units  CAT: CAT can identify the health impairment of COPD patients and is better correlated with disease progression.  CAT has a scoring range  of zero to 40. The CAT score is classified into four groups of low (less than 10), medium (10 - 20), high (21-30) and very high (31-40) based on the impact level of disease on health status. A CAT score over 10 suggests significant symptoms.  A worsening CAT score could be explained by an exacerbation, poor medication adherence, poor inhaler technique, or progression of COPD or comorbid conditions.  CAT MCID is 2 points  mMRC: mMRC (Modified Medical Research Council) Dyspnea Scale is used to assess the degree of baseline functional disability in patients of respiratory disease due to dyspnea. No minimal important difference is established. A decrease in score of 1 point or greater is considered a positive change.   Pulmonary Function Assessment:   Exercise Target Goals: Exercise Program Goal: Individual exercise prescription set using results from initial 6 min walk test and THRR while considering  patient's activity barriers  and safety.   Exercise Prescription Goal: Initial exercise prescription builds to 30-45 minutes a day of aerobic activity, 2-3 days per week.  Home exercise guidelines will be given to patient during program as part of exercise prescription that the participant will acknowledge.  Activity Barriers & Risk Stratification:  Activity Barriers & Cardiac Risk Stratification - 09/23/19 1142      Activity Barriers & Cardiac Risk Stratification   Activity Barriers Deconditioning;Shortness of Breath    Cardiac Risk Stratification Low           6 Minute Walk:  6 Minute Walk    Row Name 09/23/19 1139         6 Minute Walk   Phase Initial     Distance 1200 feet     Walk Time 6 minutes     # of Rest Breaks 0     MPH 2.27     METS 2.74     RPE 13     Perceived Dyspnea  13     VO2 Peak 9.44     Symptoms No     Resting HR 76 bpm     Resting BP 110/80     Resting Oxygen Saturation  97 %     Exercise Oxygen Saturation  during 6 min walk 94 %     Max Ex. HR 110 bpm     Max Ex. BP 146/106     2 Minute Post BP 110/98            Oxygen Initial Assessment:  Oxygen Initial Assessment - 09/23/19 1135      Home Oxygen   Home Oxygen Device E-Tanks    Sleep Oxygen Prescription None    Home Exercise Oxygen Prescription Continuous    Liters per minute 1    Home Resting Oxygen Prescription None    Compliance with Home Oxygen Use Yes      Initial 6 min Walk   Oxygen Used None      Program Oxygen Prescription   Program Oxygen Prescription Continuous   As needed   Liters per minute 2      Intervention   Short Term Goals To learn and exhibit compliance with exercise, home and travel O2 prescription;To learn and understand importance of maintaining oxygen saturations>88%;To learn and demonstrate proper pursed lip breathing techniques or other breathing techniques.    Long  Term Goals Exhibits compliance with exercise, home and travel O2 prescription;Maintenance of O2 saturations>88%            Oxygen Re-Evaluation:  Oxygen Re-Evaluation  [place] Name 10/20/19 1241 11/18/19 0944 12/16/19 1424 01/13/20 0843 02/10/20 0907     Program Oxygen Prescription   Program Oxygen Prescription None None None None None     Home Oxygen   Home Oxygen Device None None None None None   Sleep Oxygen Prescription None None None None None   Home Exercise Oxygen Prescription None None None None None   Home Resting Oxygen Prescription None None None None None   Compliance with Home Oxygen Use Yes Yes Yes Yes Yes     Goals/Expected Outcomes   Short Term Goals To learn and exhibit compliance with exercise, home and travel O2 prescription;To learn and understand importance of maintaining oxygen saturations>88%;To learn and demonstrate proper pursed lip breathing techniques or other breathing techniques. To learn and exhibit compliance with exercise, home and travel O2 prescription;To learn and understand importance of maintaining oxygen saturations>88%;To learn and demonstrate proper pursed lip breathing techniques or other breathing techniques. To learn and exhibit compliance with exercise, home and travel O2 prescription;To learn and understand importance of maintaining oxygen saturations>88%;To learn and demonstrate proper pursed lip breathing techniques or other breathing techniques. To learn and exhibit compliance with exercise, home and travel O2 prescription;To learn and understand importance of maintaining oxygen saturations>88%;To learn and demonstrate proper pursed lip breathing techniques or other breathing techniques. To learn and exhibit compliance with exercise, home and travel O2 prescription;To learn and understand importance of maintaining oxygen saturations>88%;To learn and demonstrate proper pursed lip breathing techniques or other breathing techniques.   Long  Term Goals Exhibits compliance with exercise, home and travel O2 prescription;Maintenance of O2 saturations>88% Exhibits  compliance with exercise, home and travel O2 prescription;Maintenance of O2 saturations>88% Exhibits compliance with exercise, home and travel O2 prescription;Maintenance of O2 saturations>88% Exhibits compliance with exercise, home and travel O2 prescription;Maintenance of O2 saturations>88% Exhibits compliance with exercise, home and travel O2 prescription;Maintenance of O2 saturations>88%   Comments Patient is meeting her expected goals and outcomes both long and short term. Will continue to monitor for progress. Patient is meeting her expected goals and outcomes both long and short term. Will continue to monitor for progress. Patient is meeting her expected goals and outcomes both long and short term. Will continue to monitor for progress. Patient is meeting her expected goals and outcomes both long and short term. Will continue to monitor for progress. --   Goals/Expected Outcomes Patient will continue to meet her expected goals and outcomes. Patient will continue to meet her expected goals and outcomes. Patient will continue to meet her expected goals and outcomes. Patient will continue to meet her expected goals and outcomes. compliance          Oxygen Discharge (Final Oxygen Re-Evaluation):  Oxygen Re-Evaluation - 02/10/20 0907      Program Oxygen Prescription   Program Oxygen Prescription None      Home Oxygen   Home Oxygen Device None    Sleep Oxygen Prescription None    Home Exercise Oxygen Prescription None    Home Resting Oxygen Prescription None    Compliance with Home Oxygen Use Yes      Goals/Expected Outcomes   Short Term Goals To learn and exhibit compliance with exercise, home and travel O2 prescription;To learn and understand importance of maintaining oxygen saturations>88%;To learn and demonstrate proper pursed lip breathing techniques or other breathing techniques.    Long  Term Goals Exhibits compliance with exercise, home and travel O2 prescription;Maintenance of  O2 saturations>88%    Goals/Expected Outcomes  compliance           Initial Exercise Prescription:  Initial Exercise Prescription - 09/23/19 1100      Date of Initial Exercise RX and Referring Provider   Date 09/23/19    Referring Provider Dr. Shearon Stalls    Expected Discharge Date 12/24/19      Treadmill   MPH 1.2    Grade 0    Minutes 17    METs 1.91      T5 Nustep   Level 1    SPM 73    Minutes 22    METs 1.7      Prescription Details   Frequency (times per week) 2    Duration Progress to 30 minutes of continuous aerobic without signs/symptoms of physical distress      Intensity   Ratings of Perceived Exertion 11-13    Perceived Dyspnea 0-4      Progression   Progression Continue progressive overload as per policy without signs/symptoms or physical distress.      Resistance Training   Training Prescription Yes    Weight 1    Reps 10-15           Perform Capillary Blood Glucose checks as needed.  Exercise Prescription Changes:   Exercise Prescription Changes    Row Name 10/09/19 1500 10/26/19 1400 11/09/19 1300 11/24/19 1000 12/03/19 1152     Response to Exercise   Blood Pressure (Admit) 122/88 138/70 120/82 120/72 128/80   Blood Pressure (Exercise) 142/86 152/88 118/80 148/78 136/84   Blood Pressure (Exit) 122/84 112/82 120/70 124/80 124/84   Heart Rate (Admit) 76 bpm 71 bpm 81 bpm 77 bpm 87 bpm   Heart Rate (Exercise) 97 bpm 99 bpm 102 bpm 107 bpm 99 bpm   Heart Rate (Exit) 81 bpm 84 bpm 90 bpm 85 bpm 84 bpm   Oxygen Saturation (Admit) 97 % 98 % 95 % 95 % 95 %   Oxygen Saturation (Exercise) 93 % 95 % 95 % 94 % 92 %   Oxygen Saturation (Exit) 96 % 92 % 97 % 98 % 94 %   Rating of Perceived Exertion (Exercise) 11 11 11 11 11    Perceived Dyspnea (Exercise) 11 11 11 11 11    Symptoms none -- -- -- --   Duration Continue with 30 min of aerobic exercise without signs/symptoms of physical distress. Continue with 30 min of aerobic exercise without signs/symptoms  of physical distress. Continue with 30 min of aerobic exercise without signs/symptoms of physical distress. Continue with 30 min of aerobic exercise without signs/symptoms of physical distress. Continue with 30 min of aerobic exercise without signs/symptoms of physical distress.   Intensity THRR unchanged THRR unchanged THRR unchanged THRR unchanged THRR unchanged     Progression   Progression Continue to progress workloads to maintain intensity without signs/symptoms of physical distress. Continue to progress workloads to maintain intensity without signs/symptoms of physical distress. Continue to progress workloads to maintain intensity without signs/symptoms of physical distress. Continue to progress workloads to maintain intensity without signs/symptoms of physical distress. Continue to progress workloads to maintain intensity without signs/symptoms of physical distress.   Average METs 2.15 -- -- -- --     Resistance Training   Training Prescription Yes Yes Yes Yes Yes   Weight 2 2 2 3 3    Reps 10-15 10-15 10-15 10-15 10-15   Time 10 Minutes -- -- -- --     Oxygen   Liters 0 -- -- -- --  Treadmill   MPH 1.3 1.4 1.6 1.8 2   Grade 0 0 0 0 0   Minutes 17 17 17 17 17    METs 1.99 1.99 2.22 2.37 2.53     T5 Nustep   Level 1 4 2 2 2    SPM 105 118 126 126 139   Minutes 22 22 22 22 22    METs 2.4 2.5 2.3 3.5 3.4     Home Exercise Plan   Plans to continue exercise at Home (comment) -- -- -- --   Frequency Add 3 additional days to program exercise sessions. -- -- -- --   Initial Home Exercises Provided 09/23/19 -- -- -- --   [place] Name 12/22/19 1100 12/24/19 1332 01/19/20 1500 02/02/20 1340       Response to Exercise   Blood Pressure (Admit) 122/70 120/80 122/72 98/58    Blood Pressure (Exercise) 162/80 140/70 152/78 140/80    Blood Pressure (Exit) 104/82 130/80 140/90 122/74    Heart Rate (Admit) 64 bpm 74 bpm 86 bpm 67 bpm    Heart Rate (Exercise) 97 bpm 102 bpm 108 bpm 103 bpm     Heart Rate (Exit) 80 bpm 86 bpm 92 bpm 76 bpm    Oxygen Saturation (Admit) 99 % 96 % 99 % 94 %    Oxygen Saturation (Exercise) 94 % 93 % 94 % 91 %    Oxygen Saturation (Exit) 99 % 95 % 98 % 95 %    Rating of Perceived Exertion (Exercise) 11 11 11 10     Perceived Dyspnea (Exercise) 11 11 11 11     Duration Continue with 30 min of aerobic exercise without signs/symptoms of physical distress. Continue with 30 min of aerobic exercise without signs/symptoms of physical distress. Continue with 30 min of aerobic exercise without signs/symptoms of physical distress. Continue with 30 min of aerobic exercise without signs/symptoms of physical distress.    Intensity THRR unchanged THRR unchanged THRR unchanged THRR unchanged      Progression   Progression Continue to progress workloads to maintain intensity without signs/symptoms of physical distress. Continue to progress workloads to maintain intensity without signs/symptoms of physical distress. -- Continue to progress workloads to maintain intensity without signs/symptoms of physical distress.      Resistance Training   Training Prescription Yes Yes Yes Yes    Weight 3 3 3  lbs 3 lbs    Reps 10-15 10-15 10-15 10-15      Treadmill   MPH 2.2 2.2 2.2 2.2    Grade 0 0 0 0    Minutes 17 17 17 17     METs 2.68 2.68 2.68 2.68      T5 Nustep   Level 3 3 4 4     SPM 135 120 117 97    Minutes 22 22 22 22     METs 3.1 3.1 2.5 2.3           Exercise Comments:   Exercise Comments    Row Name 09/23/19 1153 10/09/19 1525         Exercise Comments Patient preformed well just got of little short of breath. Patient is looking foward to starting the program. Patient has been here 6 sessions so far. Patient is preforming well and very motivated to reach her goals. We will continue to progress as tolerated.             Exercise Goals and Review:   Exercise Goals    Row Name 09/23/19 1151 12/15/19 1247  01/13/20 0844 02/10/20 0907       Exercise  Goals   Increase Physical Activity Yes Yes Yes Yes    Intervention Provide advice, education, support and counseling about physical activity/exercise needs.;Develop an individualized exercise prescription for aerobic and resistive training based on initial evaluation findings, risk stratification, comorbidities and participant's personal goals. Provide advice, education, support and counseling about physical activity/exercise needs.;Develop an individualized exercise prescription for aerobic and resistive training based on initial evaluation findings, risk stratification, comorbidities and participant's personal goals. Provide advice, education, support and counseling about physical activity/exercise needs.;Develop an individualized exercise prescription for aerobic and resistive training based on initial evaluation findings, risk stratification, comorbidities and participant's personal goals. Provide advice, education, support and counseling about physical activity/exercise needs.;Develop an individualized exercise prescription for aerobic and resistive training based on initial evaluation findings, risk stratification, comorbidities and participant's personal goals.    Expected Outcomes Short Term: Attend rehab on a regular basis to increase amount of physical activity.;Long Term: Add in home exercise to make exercise part of routine and to increase amount of physical activity. Short Term: Attend rehab on a regular basis to increase amount of physical activity.;Long Term: Add in home exercise to make exercise part of routine and to increase amount of physical activity.;Long Term: Exercising regularly at least 3-5 days a week. Short Term: Attend rehab on a regular basis to increase amount of physical activity.;Long Term: Add in home exercise to make exercise part of routine and to increase amount of physical activity.;Long Term: Exercising regularly at least 3-5 days a week. Short Term: Attend rehab on a regular  basis to increase amount of physical activity.;Long Term: Add in home exercise to make exercise part of routine and to increase amount of physical activity.;Long Term: Exercising regularly at least 3-5 days a week.    Increase Strength and Stamina Yes Yes Yes Yes    Intervention Provide advice, education, support and counseling about physical activity/exercise needs.;Develop an individualized exercise prescription for aerobic and resistive training based on initial evaluation findings, risk stratification, comorbidities and participant's personal goals. Provide advice, education, support and counseling about physical activity/exercise needs.;Develop an individualized exercise prescription for aerobic and resistive training based on initial evaluation findings, risk stratification, comorbidities and participant's personal goals. Provide advice, education, support and counseling about physical activity/exercise needs.;Develop an individualized exercise prescription for aerobic and resistive training based on initial evaluation findings, risk stratification, comorbidities and participant's personal goals. Provide advice, education, support and counseling about physical activity/exercise needs.;Develop an individualized exercise prescription for aerobic and resistive training based on initial evaluation findings, risk stratification, comorbidities and participant's personal goals.    Expected Outcomes Long Term: Improve cardiorespiratory fitness, muscular endurance and strength as measured by increased METs and functional capacity (6MWT);Short Term: Perform resistance training exercises routinely during rehab and add in resistance training at home Short Term: Increase workloads from initial exercise prescription for resistance, speed, and METs.;Short Term: Perform resistance training exercises routinely during rehab and add in resistance training at home Short Term: Increase workloads from initial exercise  prescription for resistance, speed, and METs.;Short Term: Perform resistance training exercises routinely during rehab and add in resistance training at home Short Term: Increase workloads from initial exercise prescription for resistance, speed, and METs.;Short Term: Perform resistance training exercises routinely during rehab and add in resistance training at home    Able to understand and use rate of perceived exertion (RPE) scale Yes Yes Yes Yes    Intervention Provide education and explanation on  how to use RPE scale Provide education and explanation on how to use RPE scale Provide education and explanation on how to use RPE scale Provide education and explanation on how to use RPE scale    Expected Outcomes Short Term: Able to use RPE daily in rehab to express subjective intensity level;Long Term:  Able to use RPE to guide intensity level when exercising independently Short Term: Able to use RPE daily in rehab to express subjective intensity level;Long Term:  Able to use RPE to guide intensity level when exercising independently Short Term: Able to use RPE daily in rehab to express subjective intensity level;Long Term:  Able to use RPE to guide intensity level when exercising independently Short Term: Able to use RPE daily in rehab to express subjective intensity level;Long Term:  Able to use RPE to guide intensity level when exercising independently    Able to understand and use Dyspnea scale Yes Yes Yes Yes    Intervention Provide education and explanation on how to use Dyspnea scale Provide education and explanation on how to use Dyspnea scale Provide education and explanation on how to use Dyspnea scale Provide education and explanation on how to use Dyspnea scale    Expected Outcomes Short Term: Able to use Dyspnea scale daily in rehab to express subjective sense of shortness of breath during exertion;Long Term: Able to use Dyspnea scale to guide intensity level when exercising independently Short  Term: Able to use Dyspnea scale daily in rehab to express subjective sense of shortness of breath during exertion;Long Term: Able to use Dyspnea scale to guide intensity level when exercising independently Short Term: Able to use Dyspnea scale daily in rehab to express subjective sense of shortness of breath during exertion;Long Term: Able to use Dyspnea scale to guide intensity level when exercising independently Short Term: Able to use Dyspnea scale daily in rehab to express subjective sense of shortness of breath during exertion;Long Term: Able to use Dyspnea scale to guide intensity level when exercising independently    Knowledge and understanding of Target Heart Rate Range (THRR) Yes Yes Yes Yes    Intervention Provide education and explanation of THRR including how the numbers were predicted and where they are located for reference Provide education and explanation of THRR including how the numbers were predicted and where they are located for reference Provide education and explanation of THRR including how the numbers were predicted and where they are located for reference Provide education and explanation of THRR including how the numbers were predicted and where they are located for reference    Expected Outcomes Long Term: Able to use THRR to govern intensity when exercising independently;Short Term: Able to state/look up THRR Short Term: Able to state/look up THRR;Long Term: Able to use THRR to govern intensity when exercising independently;Short Term: Able to use daily as guideline for intensity in rehab Short Term: Able to state/look up THRR;Long Term: Able to use THRR to govern intensity when exercising independently;Short Term: Able to use daily as guideline for intensity in rehab Short Term: Able to state/look up THRR;Long Term: Able to use THRR to govern intensity when exercising independently;Short Term: Able to use daily as guideline for intensity in rehab    Able to check pulse  independently Yes -- -- --    Intervention Provide education and demonstration on how to check pulse in carotid and radial arteries.;Review the importance of being able to check your own pulse for safety during independent exercise -- -- --  Expected Outcomes Short Term: Able to explain why pulse checking is important during independent exercise;Long Term: Able to check pulse independently and accurately -- -- --    Understanding of Exercise Prescription Yes Yes Yes Yes    Intervention Provide education, explanation, and written materials on patient's individual exercise prescription Provide education, explanation, and written materials on patient's individual exercise prescription Provide education, explanation, and written materials on patient's individual exercise prescription Provide education, explanation, and written materials on patient's individual exercise prescription    Expected Outcomes Short Term: Able to explain program exercise prescription;Long Term: Able to explain home exercise prescription to exercise independently Short Term: Able to explain program exercise prescription;Long Term: Able to explain home exercise prescription to exercise independently Short Term: Able to explain program exercise prescription;Long Term: Able to explain home exercise prescription to exercise independently Short Term: Able to explain program exercise prescription;Long Term: Able to explain home exercise prescription to exercise independently           Exercise Goals Re-Evaluation :  Exercise Goals Re-Evaluation    Row Name 10/12/19 1252 11/13/19 1451 12/15/19 1249 01/13/20 0844 02/10/20 0908     Exercise Goal Re-Evaluation   Exercise Goals Review Increase Physical Activity;Increase Strength and Stamina Increase Physical Activity;Increase Strength and Stamina Increase Physical Activity;Increase Strength and Stamina;Able to understand and use rate of perceived exertion (RPE) scale;Able to understand  and use Dyspnea scale;Knowledge and understanding of Target Heart Rate Range (THRR);Understanding of Exercise Prescription;Improve claudication pain tolerance and improve walking ability Increase Physical Activity;Increase Strength and Stamina;Able to understand and use rate of perceived exertion (RPE) scale;Able to understand and use Dyspnea scale;Knowledge and understanding of Target Heart Rate Range (THRR);Understanding of Exercise Prescription Increase Physical Activity;Increase Strength and Stamina;Able to understand and use rate of perceived exertion (RPE) scale;Able to understand and use Dyspnea scale;Knowledge and understanding of Target Heart Rate Range (THRR);Understanding of Exercise Prescription   Comments Patients goals are to get lungs stronger, be healthier and to lose 50lbs. Patient feels that she is getting stronger with each visit. She says she is already able to do more at home and while shopping. Patient is not losing any weight. She has gained 2lbs. We will talk to her about her weight lose goals and to discuss her diet. We will continue to monitor her progress. She is breathing and feeling better so far.The program has motivated her to lose weight and live a better lifestyle. She also feels better about exercising on her own since her oxygen levls have improved. She tolerates increased workloads well. She works hard towards her goals each session. Patient said that she feels good and heathier. She is still working on the weight loss. She is exercising at home and has changed her diet. She pushes herself and others in class to reach their goals. She has a steady progression so far. She mention still feeling some tightness in her throat and will adress that with her doctor. We will continue to progress as tolerated. Pt has attended 25 exercise sessions. She has been absent from the program since 12/24/19. She has been working out at [employer] in order to exercise 3-5 days per week. She is  feeling much stronger. She currently exercises at 3.1 METs on the stepper. Will continue to monitor and progress as able. Pt has attended 31 exercises sessions. She continues to exercise at [employer] multiple times a week on her days off. She will be graduating from the program soon. She continues to feel  stronger. She is currently exercising at 2.3 METs on the stepper. WIll continue to monitor and progress as able.   Expected Outcomes To reach her expected goals. To reach expected goals To reach expected goals Through exercise at rehab and by engaging in a home exercise program, the pt will reach their goals. Through exercise at rehab and by engaging in a home exercise program, the pt will reach their goals.          Discharge Exercise Prescription (Final Exercise Prescription Changes):  Exercise Prescription Changes - 02/02/20 1340      Response to Exercise   Blood Pressure (Admit) 98/58    Blood Pressure (Exercise) 140/80    Blood Pressure (Exit) 122/74    Heart Rate (Admit) 67 bpm    Heart Rate (Exercise) 103 bpm    Heart Rate (Exit) 76 bpm    Oxygen Saturation (Admit) 94 %    Oxygen Saturation (Exercise) 91 %    Oxygen Saturation (Exit) 95 %    Rating of Perceived Exertion (Exercise) 10    Perceived Dyspnea (Exercise) 11    Duration Continue with 30 min of aerobic exercise without signs/symptoms of physical distress.    Intensity THRR unchanged      Progression   Progression Continue to progress workloads to maintain intensity without signs/symptoms of physical distress.      Resistance Training   Training Prescription Yes    Weight 3 lbs    Reps 10-15      Treadmill   MPH 2.2    Grade 0    Minutes 17    METs 2.68      T5 Nustep   Level 4    SPM 97    Minutes 22    METs 2.3           Nutrition:  Target Goals: Understanding of nutrition guidelines, daily intake of sodium 1500mg , cholesterol 200mg , calories 30% from fat and 7% or less from saturated fats,  daily to have 5 or more servings of fruits and vegetables.  Biometrics:  Pre Biometrics - 09/23/19 1156      Pre Biometrics   Height 5\' 7"  (1.702 m)    Weight 141.2 kg    Waist Circumference 55 inches    Hip Circumference 60 inches    Waist to Hip Ratio 0.92 %    BMI (Calculated) 48.75    Triceps Skinfold 15 mm    % Body Fat 52.3 %    Grip Strength 25.53 kg    Flexibility 14.33 in    Single Leg Stand 9.29 seconds            Nutrition Therapy Plan and Nutrition Goals:  Nutrition Therapy & Goals - 02/11/20 0730      Personal Nutrition Goals   Comments Patient continues to say she trys to heart healthy. Will continue to monitor.      Intervention Plan   Intervention Nutrition handout(s) given to patient.           Nutrition Assessments:  Nutrition Assessments - 09/23/19 1141      MEDFICTS Scores   Pre Score 35           Nutrition Goals Re-Evaluation:   Nutrition Goals Discharge (Final Nutrition Goals Re-Evaluation):   Psychosocial: Target Goals: Acknowledge presence or absence of significant depression and/or stress, maximize coping skills, provide positive support system. Participant is able to verbalize types and ability to use techniques and skills needed for reducing stress and  depression.  Initial Review & Psychosocial Screening:  Initial Psych Review & Screening - 09/23/19 1138      Initial Review   Current issues with Current Depression   All due to having COVID-19 and not being able to do ADL's     [place]? Yes      Barriers   Psychosocial barriers to participate in program The patient should benefit from training in stress management and relaxation.      Screening Interventions   Interventions Encouraged to exercise    Expected Outcomes Short Term goal: Identification and review with participant of any Quality of Life or Depression concerns found by scoring the questionnaire.;Long Term goal: The participant  improves quality of Life and PHQ9 Scores as seen by post scores and/or verbalization of changes           Quality of Life Scores:  Quality of Life - 09/23/19 1200      Quality of Life   Select Quality of Life      Quality of Life Scores   Health/Function Pre 13.31 %    Socioeconomic Pre 25.5 %    Psych/Spiritual Pre 15.43 %    Family Pre 30 %    GLOBAL Pre 18.14 %          Scores of 19 and below usually indicate a poorer quality of life in these areas.  A difference of  2-3 points is a clinically meaningful difference.  A difference of 2-3 points in the total score of the Quality of Life Index has been associated with significant improvement in overall quality of life, self-image, physical symptoms, and general health in studies assessing change in quality of life.   PHQ-9: Recent Review Flowsheet Data    Depression screen [facility] 2/9 02/05/2020 01/28/2020 09/23/2019   Decreased Interest 1 0 0   Down, Depressed, Hopeless 0 0 1   PHQ - 2 Score 1 0 1   Altered sleeping 2 - 2   Tired, decreased energy 3 - 3   Change in appetite 2 - 1   Feeling bad or failure about yourself  0 - 1   Trouble concentrating 0 - 1   Moving slowly or fidgety/restless 0 - 0   Suicidal thoughts 0 - 0   PHQ-9 Score 8 - 9   Difficult doing work/chores Not difficult at all - Very difficult     Interpretation of Total Score  Total Score Depression Severity:  1-4 = Minimal depression, 5-9 = Mild depression, 10-14 = Moderate depression, 15-19 = Moderately severe depression, 20-27 = Severe depression   Psychosocial Evaluation and Intervention:  Psychosocial Evaluation - 09/23/19 1139      Psychosocial Evaluation & Interventions   Interventions Stress management education;Encouraged to exercise with the program and follow exercise prescription;Relaxation education    Continue Psychosocial Services  Follow up required by staff           Psychosocial Re-Evaluation:  Psychosocial Re-Evaluation    Row  Name 10/20/19 1248 11/18/19 0946 12/16/19 1433 01/13/20 1344 02/11/20 0731     Psychosocial Re-Evaluation   Current issues with -- None Identified None Identified None Identified None Identified   Comments Patient's initial QOL score was 18.14 and her PHQ-9 score was 9. She has no diagnosis of depression. She says she feels depressed at times due to her inability to do her ADL's without SOB all due to [place]. She feels she is able to manage these  feelings through her family support. She says as she begins to feel better physically, her emotional state improves. She does take Alprazolam 0.5 mg prn for sleep. Will continue to monitor. Patient says she is feeling better emotionally because she feels better physically. Her episodes of feeling down and depressed are decreasing. She says he is feeling life doing more now and enjoys exercising. She continues to take Alprazolam 0.5 mg prn for sleep. Will continue to monitor. Patient continues to take Alprazolam 0.5 mg for sleep. She reports her depression episodes continue to lessen as she feels better physically. Will continue to monitor. Patient continues to take Alprazolam 0.5 mg for sleep. She reports her depression episodes continue to lessen as she feels better physically. Will continue to monitor. Patient continues to take Alprazolam 0.5 mg for sleep. She reports her depression episodes continue to lessen as she feels better physically. Will continue to monitor.   Expected Outcomes Patient will have improved QOL and PHQ-9 scores at discharge with no psychosocial issues identified. Patient will have improved QOL and PHQ-9 scores at discharge with no psychosocial issues identified. Patient will have improved QOL and PHQ-9 scores at discharge with no psychosocial issues identified. Patient will have improved QOL and PHQ-9 scores at discharge with no psychosocial issues identified. Patient will have improved QOL and PHQ-9 scores at discharge with no  psychosocial issues identified.   Interventions Encouraged to attend Pulmonary Rehabilitation for the exercise;Relaxation education;Stress management education Encouraged to attend Pulmonary Rehabilitation for the exercise;Relaxation education;Stress management education Encouraged to attend Pulmonary Rehabilitation for the exercise;Relaxation education;Stress management education Encouraged to attend Pulmonary Rehabilitation for the exercise;Relaxation education;Stress management education Encouraged to attend Pulmonary Rehabilitation for the exercise;Relaxation education;Stress management education   Continue Psychosocial Services  Follow up required by staff No Follow up required No Follow up required No Follow up required No Follow up required          Psychosocial Discharge (Final Psychosocial Re-Evaluation):  Psychosocial Re-Evaluation - 02/11/20 0731      Psychosocial Re-Evaluation   Current issues with None Identified    Comments Patient continues to take Alprazolam 0.5 mg for sleep. She reports her depression episodes continue to lessen as she feels better physically. Will continue to monitor.    Expected Outcomes Patient will have improved QOL and PHQ-9 scores at discharge with no psychosocial issues identified.    Interventions Encouraged to attend Pulmonary Rehabilitation for the exercise;Relaxation education;Stress management education    Continue Psychosocial Services  No Follow up required            Education: Education Goals: Education classes will be provided on a weekly basis, covering required topics. Participant will state understanding/return demonstration of topics presented.  Learning Barriers/Preferences:  Learning Barriers/Preferences - 09/23/19 1141      Learning Barriers/Preferences   Learning Barriers None    Learning Preferences Audio;Computer/Internet;Group Instruction;Individual Instruction;Pictoral;Skilled Demonstration;Verbal Instruction;Video;Written  Material           Education Topics: How Lungs Work and Diseases: - Discuss the anatomy of the lungs and diseases that can affect the lungs, such as COPD.   PULMONARY REHAB OTHER RESPIRATORY from 01/21/2020 in [place]  Date 11/19/19  Educator Etheleen Mayhew  Instruction Review Code 1- Verbalizes Understanding      Exercise: -Discuss the importance of exercise, FITT principles of exercise, normal and abnormal responses to exercise, and how to exercise safely.   Environmental Irritants: -Discuss types of environmental irritants and how to limit exposure to  environmental irritants.   PULMONARY REHAB OTHER RESPIRATORY from 01/21/2020 in [place]  Date 11/26/19  Educator Etheleen Mayhew  Instruction Review Code 1- Verbalizes Understanding      Meds/Inhalers and oxygen: - Discuss respiratory medications, definition of an inhaler and oxygen, and the proper way to use an inhaler and oxygen.   Energy Saving Techniques: - Discuss methods to conserve energy and decrease shortness of breath when performing activities of daily living.    PULMONARY REHAB OTHER RESPIRATORY from 01/21/2020 in [place]  Date 12/10/19  Educator [place]  Instruction Review Code 1- Verbalizes Understanding      Bronchial Hygiene / Breathing Techniques: - Discuss breathing mechanics, pursed-lip breathing technique,  proper posture, effective ways to clear airways, and other functional breathing techniques   PULMONARY REHAB OTHER RESPIRATORY from 01/21/2020 in [place]  Date 12/17/19  Educator [place]  Instruction Review Code 1- Water engineer: - Provides group verbal and written instruction about the health risks of elevated stress, cause of high stress, and healthy ways to reduce stress.   PULMONARY REHAB OTHER RESPIRATORY from 01/21/2020 in [place]  Date 09/24/19   Educator Etheleen Mayhew  Instruction Review Code 1- Verbalizes Understanding      Nutrition I: Fats: - Discuss the types of cholesterol, what cholesterol does to the body, and how cholesterol levels can be controlled.   PULMONARY REHAB OTHER RESPIRATORY from 01/21/2020 in [place]  Date 10/01/19  Educator Etheleen Mayhew  Instruction Review Code 1- Verbalizes Understanding      Nutrition II: Labels: -Discuss the different components of food labels and how to read food labels.   PULMONARY REHAB OTHER RESPIRATORY from 01/21/2020 in [place]  Date 10/08/19  Educator D. Coad  Instruction Review Code 1- Verbalizes Understanding      Respiratory Infections: - Discuss the signs and symptoms of respiratory infections, ways to prevent respiratory infections, and the importance of seeking medical treatment when having a respiratory infection.   Stress I: Signs and Symptoms: - Discuss the causes of stress, how stress may lead to anxiety and depression, and ways to limit stress.   PULMONARY REHAB OTHER RESPIRATORY from 01/21/2020 in [place]  Date 01/21/20  Educator DF  Instruction Review Code 2- Demonstrated Understanding      Stress II: Relaxation: -Discuss relaxation techniques to limit stress.   PULMONARY REHAB OTHER RESPIRATORY from 01/21/2020 in [place]  Date 10/29/19  Educator DF  Instruction Review Code 2- Demonstrated Understanding      Oxygen for Home/Travel: - Discuss how to prepare for travel when on oxygen and proper ways to transport and store oxygen to ensure safety.   PULMONARY REHAB OTHER RESPIRATORY from 01/21/2020 in [place]  Date 11/05/19  Educator [place]  Instruction Review Code 1- Verbalizes Understanding      Knowledge Questionnaire Score:  Knowledge Questionnaire Score - 09/23/19 1141      Knowledge Questionnaire Score   Pre Score 14/18            Core Components/Risk Factors/Patient Goals at Admission:  Personal Goals and Risk Factors at Admission - 09/23/19 1142      Core Components/Risk Factors/Patient Goals on Admission    Weight Management Yes    Intervention Weight Management/Obesity: Establish reasonable short term and long term weight goals.    Admit Weight 311 lb 4.8 oz (  141.2 kg)    Goal Weight: Short Term 286 lb 4.8 oz (129.9 kg)    Goal Weight: Long Term 236 lb 4.8 oz (107.2 kg)    Expected Outcomes Short Term: Continue to assess and modify interventions until short term weight is achieved;Long Term: Adherence to nutrition and physical activity/exercise program aimed toward attainment of established weight goal    Personal Goal Other Yes    Personal Goal Get lungs stronger, Lose weight, get off oxygen, be healthier overall.    Intervention Attend class 2 x week and to supplement with at home exercise plan that was given 3 x week.    Expected Outcomes Reach expected goals.           Core Components/Risk Factors/Patient Goals Review:   Goals and Risk Factor Review    Row Name 10/20/19 1244 11/18/19 0947 12/16/19 1428 01/13/20 1344 02/11/20 0731     Core Components/Risk Factors/Patient Goals Review   Personal Goals Review Weight Management/Obesity  Get stronger; be healthier. Weight Management/Obesity;Improve shortness of breath with ADL's  Get lungs stronger; be healthier. Weight Management/Obesity;Improve shortness of breath with ADL's  Get lungs stronger; be healthier. Weight Management/Obesity;Improve shortness of breath with ADL's  Get lungs stronger; be healthier. Weight Management/Obesity;Improve shortness of breath with ADL's   Review Patient has completed 8 sessions gaining 2 bls since her initial visit. She is doing well in the program with consistent attendance. She says she is starting to feel stronger now and has a little more energy. She feels her lungs are getting stronger. She enjoys getting out and  have a routine exercise time. She feels like she is making progress toward meeting her personal goals. Will continue to monitor for progress. Patient has completed 16 sessions gaining 1 lb since last 30 day review. She continues to do well in the program with progression and consistent attendance. She continues to say she is feeling stronger and has more energy. She says her lungs are getting stronger and she continues to enjoy exercising. Will continue to monitor for progress. Patient has completed 23 sessions losing 4 lbs. She continues to do well in the program with progression and consistent attendance. She says she really enjoys coming here to exercise. She reports feeling stronger and having more energy to keep up with her teenage children. She says she feels like her lungs are getting stronger and she is getting back to where she was before COVID. Will continue to montior for progress. Patient has completed 26 sessions losing 2 lbs since last 30 day review. She continues to do well in the program with progression. She has been absent since 12/24/19 due MD appointments and getting her children settled starting back to school. She continues to go to [employer] 3 day/week in addition to [facility]. She says she feels stronger and in better shape than she did prior to getting COVID. She has more energy and is losing weight and is very pleased with her progress in the program. Will continue to monitor for progress. Patient has completed 32 sessions maintaining her weight since last 30 day review. She continues to do very well in the program with progression. She continues to exercise at [employer] on the days she is not in [place]. 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 They started her in Vitamin C, D3, Zinc, and Quercetin. She reports feeling a lot stronger with increased energy and is breathing better. She is set  to graduate 02/18/20. Will continue to monitor for  progress.   Expected Outcomes Patient will continue to attend sessions and complete the program meeting both personal and program goals. Patient will continue to attend sessions and complete the program meeting both personal and program goals. Patient will continue to attend sessions and complete the program meeting both personal and program goals. Patient will continue to attend sessions and complete the program meeting both personal and program goals. Patient will continue to attend sessions and complete the program meeting both personal and program goals.          Core Components/Risk Factors/Patient Goals at Discharge (Final Review):   Goals and Risk Factor Review - 02/11/20 0731      Core Components/Risk Factors/Patient Goals Review   Personal Goals Review Weight Management/Obesity;Improve shortness of breath with ADL's    Review Patient has completed 32 sessions maintaining her weight since last 30 day review. She continues to do very well in the program with progression. She continues to exercise at [employer] on the days she is not in [place]. 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 They started her in Vitamin C, D3, Zinc, and Quercetin. She reports feeling a lot stronger with increased energy and is breathing better. She is set to graduate 02/18/20. Will continue to monitor for progress.    Expected Outcomes Patient will continue to attend sessions and complete the program meeting both personal and program goals.           ITP Comments:  ITP Comments    Row Name 09/23/19 1131           ITP Comments Patient is being sent by her pulmonary doctor due to having COVID-19. she is eager to get started and get stronger. Her goals are to gain lung strength, come off oxygen, and just have better health.              Comments: ITP REVIEW Pt is making expected progress toward pulmonary rehab goals after completing 32 sessions. Recommend  continued exercise, life style modification, education, and utilization of breathing techniques to increase stamina and strength and decrease shortness of breath with exertion.

## 2020-02-16 ENCOUNTER — Encounter (HOSPITAL_COMMUNITY)
Admission: RE | Admit: 2020-02-16 | Discharge: 2020-02-16 | Disposition: A | Discharge status: Home / Self Care | Payer: Commercial Managed Care - PPO | Source: Ambulatory Visit | Attending: Internal Medicine | Admitting: Internal Medicine

## 2020-02-16 ENCOUNTER — Other Ambulatory Visit: Payer: Self-pay

## 2020-02-16 VITALS — Wt 310.8 lb

## 2020-02-16 DIAGNOSIS — R0602 Shortness of breath: Secondary | ICD-10-CM | POA: Insufficient documentation

## 2020-02-16 NOTE — Progress Notes (Signed)
Daily Session Note  Patient Details  Name: Gina Conner MRN: [id number] Date of Birth: 1965/12/27 Referring Provider:     PULMONARY REHAB OTHER RESP ORIENTATION from 09/23/2019 in [place]  Referring Provider Dr. Shearon Stalls      Encounter Date: 02/16/2020  Check In:  Session Check In - 02/16/20 1100      Check-In   Supervising physician immediately available to respond to emergencies See telemetry face sheet for immediately available MD    Location [facility]    Staff Present Geanie Cooley, Felipe Drone, RN, [facility]    Virtual Visit No    Medication changes reported     No    Fall or balance concerns reported    No    Tobacco Cessation No Change    Warm-up and Cool-down Performed as group-led instruction    Resistance Training Performed Yes    VAD Patient? No    PAD/SET Patient? No      Pain Assessment   Currently in Pain? No/denies    Pain Score 5     Pain Location Foot    Pain Orientation Left    Pain Descriptors / Indicators Constant;Aching    Pain Type Chronic pain    Pain Onset More than a month ago    Multiple Pain Sites No           Capillary Blood Glucose: No results found for this or any previous visit (from the past 24 hour(s)).    Social History   Tobacco Use  Smoking Status Never Smoker  Smokeless Tobacco Never Used    Goals Met:  Proper associated with RPD/PD & O2 Sat Independence with exercise equipment Exercise tolerated well No report of cardiac concerns or symptoms Strength training completed today  Goals Unmet:  Not Applicable  Comments: check out $[email or web address]$    Dr. Kathie Dike is Medical Director for [facility].

## 2020-02-18 ENCOUNTER — Encounter: Payer: Self-pay | Admitting: Nurse Practitioner

## 2020-02-18 ENCOUNTER — Other Ambulatory Visit: Payer: Self-pay

## 2020-02-18 ENCOUNTER — Encounter (HOSPITAL_COMMUNITY): Payer: Commercial Managed Care - PPO

## 2020-02-18 ENCOUNTER — Ambulatory Visit (INDEPENDENT_AMBULATORY_CARE_PROVIDER_SITE_OTHER): Payer: Commercial Managed Care - PPO | Admitting: Nurse Practitioner

## 2020-02-18 VITALS — BP 140/69 | HR 68 | Temp 98.1°F | Ht 67.0 in | Wt 306.0 lb

## 2020-02-18 DIAGNOSIS — Z13220 Encounter for screening for lipoid disorders: Secondary | ICD-10-CM | POA: Diagnosis not present

## 2020-02-18 DIAGNOSIS — Z1159 Encounter for screening for other viral diseases: Secondary | ICD-10-CM | POA: Diagnosis not present

## 2020-02-18 DIAGNOSIS — Z23 Encounter for immunization: Secondary | ICD-10-CM | POA: Diagnosis not present

## 2020-02-18 DIAGNOSIS — Z789 Other specified health status: Secondary | ICD-10-CM | POA: Diagnosis not present

## 2020-02-18 DIAGNOSIS — M899 Disorder of bone, unspecified: Secondary | ICD-10-CM

## 2020-02-18 DIAGNOSIS — E039 Hypothyroidism, unspecified: Secondary | ICD-10-CM | POA: Diagnosis not present

## 2020-02-18 DIAGNOSIS — D509 Iron deficiency anemia, unspecified: Secondary | ICD-10-CM

## 2020-02-18 NOTE — Patient Instructions (Signed)
Health Maintenance, Female Adopting a healthy lifestyle and getting preventive care are important in promoting health and wellness. Ask your health care provider about:  The right schedule for you to have regular tests and exams.  Things you can do on your own to prevent diseases and keep yourself healthy. What should I know about diet, weight, and exercise? Eat a healthy diet   Eat a diet that includes plenty of vegetables, fruits, low-fat dairy products, and lean protein.  Do not eat a lot of foods that are high in solid fats, added sugars, or sodium. Maintain a healthy weight Body mass index (BMI) is used to identify weight problems. It estimates body fat based on height and weight. Your health care provider can help determine your BMI and help you achieve or maintain a healthy weight. Get regular exercise Get regular exercise. This is one of the most important things you can do for your health. Most adults should:  Exercise for at least 150 minutes each week. The exercise should increase your heart rate and make you sweat (moderate-intensity exercise).  Do strengthening exercises at least twice a week. This is in addition to the moderate-intensity exercise.  Spend less time sitting. Even light physical activity can be beneficial. Watch cholesterol and blood lipids Have your blood tested for lipids and cholesterol at 53 years of age, then have this test every 5 years. Have your cholesterol levels checked more often if:  Your lipid or cholesterol levels are high.  You are older than 53 years of age.  You are at high risk for heart disease. What should I know about cancer screening? Depending on your health history and family history, you may need to have cancer screening at various ages. This may include screening for:  Breast cancer.  Cervical cancer.  Colorectal cancer.  Skin cancer.  Lung cancer. What should I know about heart disease, diabetes, and high blood  pressure? Blood pressure and heart disease  High blood pressure causes heart disease and increases the risk of stroke. This is more likely to develop in people who have high blood pressure readings, are of African descent, or are overweight.  Have your blood pressure checked: ? Every 3-5 years if you are 18-39 years of age. ? Every year if you are 40 years old or older. Diabetes Have regular diabetes screenings. This checks your fasting blood sugar level. Have the screening done:  Once every three years after age 40 if you are at a normal weight and have a low risk for diabetes.  More often and at a younger age if you are overweight or have a high risk for diabetes. What should I know about preventing infection? Hepatitis B If you have a higher risk for hepatitis B, you should be screened for this virus. Talk with your health care provider to find out if you are at risk for hepatitis B infection. Hepatitis C Testing is recommended for:  Everyone born from 1945 through 1965.  Anyone with known risk factors for hepatitis C. Sexually transmitted infections (STIs)  Get screened for STIs, including gonorrhea and chlamydia, if: ? You are sexually active and are younger than 53 years of age. ? You are older than 53 years of age and your health care provider tells you that you are at risk for this type of infection. ? Your sexual activity has changed since you were last screened, and you are at increased risk for chlamydia or gonorrhea. Ask your health care provider if   you are at risk.  Ask your health care provider about whether you are at high risk for HIV. Your health care provider may recommend a prescription medicine to help prevent HIV infection. If you choose to take medicine to prevent HIV, you should first get tested for HIV. You should then be tested every 3 months for as long as you are taking the medicine. Pregnancy  If you are about to stop having your period (premenopausal) and  you may become pregnant, seek counseling before you get pregnant.  Take 400 to 800 micrograms (mcg) of folic acid every day if you become pregnant.  Ask for birth control (contraception) if you want to prevent pregnancy. Osteoporosis and menopause Osteoporosis is a disease in which the bones lose minerals and strength with aging. This can result in bone fractures. If you are 65 years old or older, or if you are at risk for osteoporosis and fractures, ask your health care provider if you should:  Be screened for bone loss.  Take a calcium or vitamin D supplement to lower your risk of fractures.  Be given hormone replacement therapy (HRT) to treat symptoms of menopause. Follow these instructions at home: Lifestyle  Do not use any products that contain nicotine or tobacco, such as cigarettes, e-cigarettes, and chewing tobacco. If you need help quitting, ask your health care provider.  Do not use street drugs.  Do not share needles.  Ask your health care provider for help if you need support or information about quitting drugs. Alcohol use  Do not drink alcohol if: ? Your health care provider tells you not to drink. ? You are pregnant, may be pregnant, or are planning to become pregnant.  If you drink alcohol: ? Limit how much you use to 0-1 drink a day. ? Limit intake if you are breastfeeding.  Be aware of how much alcohol is in your drink. In the [place] one drink equals one 12 oz bottle of beer (355 mL), one 5 oz glass of wine (148 mL), or one 1 oz glass of hard liquor (44 mL). General instructions  Schedule regular health, dental, and eye exams.  Stay current with your vaccines.  Tell your health care provider if: ? You often feel depressed. ? You have ever been abused or do not feel safe at home. Summary  Adopting a healthy lifestyle and getting preventive care are important in promoting health and wellness.  Follow your health care provider's instructions about healthy  diet, exercising, and getting tested or screened for diseases.  Follow your health care provider's instructions on monitoring your cholesterol and blood pressure. This information is not intended to replace advice given to you by your health care provider. Make sure you discuss any questions you have with your health care provider. Document Revised: 04/23/2018 Document Reviewed: 04/23/2018 Elsevier Patient Education  2020 Elsevier Inc.  

## 2020-02-18 NOTE — Progress Notes (Signed)
Gina Conner, [place]  [address] Phone:  [phone]   Fax:  [phone]   New Patient Office Visit  Subjective:  Patient ID: Gina Conner, female    DOB: [birth year]  Age: 54 y.o. MRN: [id number]  CC:  Chief Complaint  Patient presents with  . Establish Care    here to establish care, seen in neuro, pain management, plumonary    HPI Gina Conner presents for establish care. She  has a past medical history of DVT (deep venous thrombosis) (HCC) and Hypothyroidism.   She has a history of COVID-19 for which she had pneumonia.  She is currently debating whether she should proceed with the COVID-19 immunization.  She admits that her symptoms have improved.  She does have some retrieval concerns.  Hypothyroidism Patient presents for evaluation of thyroid function. Symptoms consist of change in skin,  nails, or hair. Symptoms have present for several months. The symptoms are moderate.  The problem has been stable.  Previous thyroid studies include TSH.  She was treated in the past by another provider there are no labs from this time.  The hypothyroidism is due to hypothyroidism.    Past Medical History:  Diagnosis Date  . DVT (deep venous thrombosis) ([place])    provoked during tendon surgery was treated 3 months of coumadin  . Hypothyroidism     Past Surgical History:  Procedure Laterality Date  . ABDOMINAL HYSTERECTOMY    . ACHILLES TENDON REPAIR    . EXCISION/RELEASE BURSA HIP Right 02/06/2017   Procedure: Right hip bursectomy;  Surgeon: Gaynelle Arabian, MD;  Location: [facility];  Service: Orthopedics;  Laterality: Right;  . ROUX-EN-Y GASTRIC BYPASS  2002    Family History  Problem Relation Age of Onset  . Pulmonary embolism Mother     Social History   Socioeconomic History  . Marital status: Married    Spouse name: Not on file  . Number of children: Not on file  . Years of education: Not on file  . Highest education  level: Not on file  Occupational History  . Not on file  Tobacco Use  . Smoking status: Never Smoker  . Smokeless tobacco: Never Used  Substance and Sexual Activity  . Alcohol use: No    Comment: occ  . Drug use: No  . Sexual activity: Yes  Other Topics Concern  . Not on file  Social History Narrative  . Not on file   Social Determinants of Health   Financial Resource Strain:   . Difficulty of Paying Living Expenses: Not on file  Food Insecurity:   . Worried About Charity fundraiser in the Last Year: Not on file  . Ran Out of Food in the Last Year: Not on file  Transportation Needs:   . Lack of Transportation (Medical): Not on file  . Lack of Transportation (Non-Medical): Not on file  Physical Activity:   . Days of Exercise per Week: Not on file  . Minutes of Exercise per Session: Not on file  Stress:   . Feeling of Stress : Not on file  Social Connections:   . Frequency of Communication with Friends and Family: Not on file  . Frequency of Social Gatherings with Friends and Family: Not on file  . Attends Religious Services: Not on file  . Active Member of Clubs or Organizations: Not on file  . Attends Archivist Meetings: Not on file  .  Marital Status: Not on file  Intimate Partner Violence:   . Fear of Current or Ex-Partner: Not on file  . Emotionally Abused: Not on file  . Physically Abused: Not on file  . Sexually Abused: Not on file    ROS Review of Systems  Objective:   Today's Vitals: BP 140/69   Pulse 68   Temp 98.1 F (36.7 C) (Temporal)   Ht 5\' 7"  (1.702 m)   Wt (!) 306 lb (138.8 kg)   SpO2 99%   BMI 47.93 kg/m   Physical Exam Constitutional:      General: She is not in acute distress.    Appearance: She is obese. She is not ill-appearing, toxic-appearing or diaphoretic.  HENT:     Head: Normocephalic and atraumatic.     Nose: Nose normal.     Mouth/Throat:     Mouth: Mucous membranes are moist.  Cardiovascular:     Rate and  Rhythm: Normal rate and regular rhythm.     Pulses: Normal pulses.     Heart sounds: Normal heart sounds.  Pulmonary:     Effort: Pulmonary effort is normal.     Breath sounds: Normal breath sounds.  Abdominal:     Palpations: Abdomen is soft.  Musculoskeletal:        General: Normal range of motion.     Cervical back: Normal range of motion.     Comments: Varicose veins to right medial and posterior calf and upper medial thigh  Skin:    General: Skin is warm and dry.     Capillary Refill: Capillary refill takes less than 2 seconds.  Neurological:     General: No focal deficit present.     Mental Status: She is alert and oriented to person, place, and time.  Psychiatric:        Mood and Affect: Mood normal.        Behavior: Behavior normal.        Thought Content: Thought content normal.        Judgment: Judgment normal.     Assessment & Plan:   Problem List Items Addressed This Visit      Endocrine   Hypothyroidism - Primary (Chronic) Labs pending current treatment will remain the same Baseline screening labs pending   Relevant Orders   Comp. Metabolic Panel (12) (Completed)   TSH (Completed)   T3 (Completed)   T4, free (Completed)     Musculoskeletal and Integument   Disorder of skeletal system   Relevant Orders   Magnesium (Completed)     Other   Problems influencing health status   Relevant Orders   Magnesium (Completed)    Other Visit Diagnoses    Screening for cholesterol level       Relevant Orders   Lipid panel (Completed)   Encounter for hepatitis C screening test for low risk patient       Relevant Orders   Hepatitis C antibody (Completed)   Iron deficiency anemia, unspecified iron deficiency anemia type       Relevant Orders   Iron, TIBC and Ferritin Panel (Completed)   Need for Tdap vaccination       Relevant Orders   Tdap vaccine greater than or equal to 7yo IM (Completed)      Outpatient Encounter Medications as of 02/18/2020  Medication  Sig  . acetaminophen (TYLENOL) 500 MG tablet Take 1,000 mg by mouth every 6 (six) hours as needed for mild pain or headache.   Marland Kitchen  albuterol (VENTOLIN HFA) 108 (90 Base) MCG/ACT inhaler Inhale 2 puffs into the lungs every 6 (six) hours as needed for wheezing or shortness of breath.  . ALPRAZolam (XANAX) 0.5 MG tablet Take 0.5 mg by mouth at bedtime as needed for sleep.  . Cholecalciferol (D 5000) 5000 units capsule Take 5,000 Units by mouth daily.  Marland Kitchen ipratropium-albuterol (DUONEB) 0.5-2.5 (3) MG/3ML SOLN Take 3 mLs by nebulization every 6 (six) hours as needed (shortness of breath).  Marland Kitchen levothyroxine (SYNTHROID) 88 MCG tablet Take 88 mcg by mouth daily before breakfast. Pt unsure of strength.   . Multiple Vitamin (MULTIVITAMIN) tablet Take 1 tablet by mouth daily.  . progesterone (PROMETRIUM) 100 MG capsule Take 100 mg by mouth at bedtime.  Marland Kitchen spironolactone (ALDACTONE) 25 MG tablet Take 25 mg by mouth at bedtime.  . Zinc Acetate, Oral, (ZINC ACETATE PO) Take by mouth.   No facility-administered encounter medications on file as of 02/18/2020.    Follow-up: Return in about 3 months (around 05/20/2020).   Vevelyn Francois, NP

## 2020-02-19 LAB — COMP. METABOLIC PANEL (12)
AST: 19 IU/L (ref 0–40)
Albumin/Globulin Ratio: 2.3 — ABNORMAL HIGH (ref 1.2–2.2)
Albumin: 4.6 g/dL (ref 3.8–4.9)
Alkaline Phosphatase: 52 IU/L (ref 44–121)
BUN/Creatinine Ratio: 22 (ref 9–23)
BUN: 16 mg/dL (ref 6–24)
Bilirubin Total: 0.5 mg/dL (ref 0.0–1.2)
Calcium: 9.2 mg/dL (ref 8.7–10.2)
Chloride: 101 mmol/L (ref 96–106)
Creatinine, Ser: 0.72 mg/dL (ref 0.57–1.00)
GFR calc Af Amer: 111 mL/min/{1.73_m2} (ref >59)
GFR calc non Af Amer: 96 mL/min/{1.73_m2} (ref >59)
Globulin, Total: 2 g/dL (ref 1.5–4.5)
Glucose: 103 mg/dL — ABNORMAL HIGH (ref 65–99)
Potassium: 4.3 mmol/L (ref 3.5–5.2)
Sodium: 139 mmol/L (ref 134–144)
Total Protein: 6.6 g/dL (ref 6.0–8.5)

## 2020-02-19 LAB — IRON,TIBC AND FERRITIN PANEL
Ferritin: 18 ng/mL (ref 15–150)
Iron Saturation: 53 % (ref 15–55)
Iron: 235 ug/dL — ABNORMAL HIGH (ref 27–159)
Total Iron Binding Capacity: 442 ug/dL (ref 250–450)
UIBC: 207 ug/dL (ref 131–425)

## 2020-02-19 LAB — LIPID PANEL
Chol/HDL Ratio: 3.9 ratio (ref 0.0–4.4)
Cholesterol, Total: 163 mg/dL (ref 100–199)
HDL: 42 mg/dL (ref >39)
LDL Chol Calc (NIH): 85 mg/dL (ref 0–99)
Triglycerides: 213 mg/dL — ABNORMAL HIGH (ref 0–149)
VLDL Cholesterol Cal: 36 mg/dL (ref 5–40)

## 2020-02-19 LAB — MAGNESIUM: Magnesium: 2.1 mg/dL (ref 1.6–2.3)

## 2020-02-19 LAB — T4, FREE: Free T4: 1.16 ng/dL (ref 0.82–1.77)

## 2020-02-19 LAB — T3: T3, Total: 122 ng/dL (ref 71–180)

## 2020-02-19 LAB — TSH: TSH: 2.72 u[IU]/mL (ref 0.450–4.500)

## 2020-02-19 LAB — HEPATITIS C ANTIBODY: Hep C Virus Ab: 0.1 s/co ratio (ref 0.0–0.9)

## 2020-02-23 ENCOUNTER — Encounter (HOSPITAL_COMMUNITY)
Admission: RE | Admit: 2020-02-23 | Discharge: 2020-02-23 | Disposition: A | Discharge status: Home / Self Care | Payer: Commercial Managed Care - PPO | Source: Ambulatory Visit | Attending: Internal Medicine | Admitting: Internal Medicine

## 2020-02-23 ENCOUNTER — Other Ambulatory Visit: Payer: Self-pay

## 2020-02-23 VITALS — Ht 67.0 in | Wt 303.1 lb

## 2020-02-23 DIAGNOSIS — R0602 Shortness of breath: Secondary | ICD-10-CM

## 2020-02-23 NOTE — Progress Notes (Signed)
Daily Session Note  Patient Details  Name: Gina Conner MRN: [id number] Date of Birth: 1965/12/05 Referring Provider:     PULMONARY REHAB OTHER RESP ORIENTATION from 09/23/2019 in [place]  Referring Provider Dr. Shearon Stalls      Encounter Date: 02/23/2020  Check In:  Session Check In - 02/23/20 1100      Check-In   Supervising physician immediately available to respond to emergencies [facility] MD immediately available    Physician(s) Dr. Domenic Polite    Location [facility]    Staff Present Ramon Dredge, RN, MHA;Dalton Kris Mouton, MS, ACSM-CEP, Exercise Physiologist    Virtual Visit No    Medication changes reported     No    Fall or balance concerns reported    No    Tobacco Cessation No Change    Warm-up and Cool-down Performed as group-led instruction    Resistance Training Performed Yes    VAD Patient? No    PAD/SET Patient? No      Pain Assessment   Currently in Pain? No/denies    Multiple Pain Sites No           Capillary Blood Glucose: No results found for this or any previous visit (from the past 24 hour(s)).    Social History   Tobacco Use  Smoking Status Never Smoker  Smokeless Tobacco Never Used    Goals Met:  Proper associated with RPD/PD & O2 Sat Independence with exercise equipment Improved SOB with ADL's Exercise tolerated well No report of cardiac concerns or symptoms Strength training completed today  Goals Unmet:  Not Applicable  Comments: [phone]   Dr. Kathie Dike is Medical Director for [facility].

## 2020-02-25 ENCOUNTER — Encounter (HOSPITAL_COMMUNITY): Payer: Commercial Managed Care - PPO

## 2020-03-04 ENCOUNTER — Encounter
Payer: Commercial Managed Care - PPO | Attending: Physical Medicine & Rehabilitation | Admitting: Physical Medicine & Rehabilitation

## 2020-03-04 ENCOUNTER — Other Ambulatory Visit: Payer: Self-pay

## 2020-03-04 ENCOUNTER — Encounter: Payer: Self-pay | Admitting: Physical Medicine & Rehabilitation

## 2020-03-04 VITALS — BP 126/83 | HR 79 | Temp 98.0°F | Ht 67.0 in | Wt 307.2 lb

## 2020-03-04 DIAGNOSIS — M170 Bilateral primary osteoarthritis of knee: Secondary | ICD-10-CM | POA: Insufficient documentation

## 2020-03-04 DIAGNOSIS — M25562 Pain in left knee: Secondary | ICD-10-CM | POA: Insufficient documentation

## 2020-03-04 DIAGNOSIS — G8929 Other chronic pain: Secondary | ICD-10-CM | POA: Insufficient documentation

## 2020-03-04 DIAGNOSIS — M25561 Pain in right knee: Secondary | ICD-10-CM | POA: Diagnosis not present

## 2020-03-04 NOTE — Patient Instructions (Addendum)
Sodium Hyaluronate intra-articular injection What is this medicine? SODIUM HYALURONATE (SOE dee um hye al yoor ON ate) is used to treat pain in the knee due to osteoarthritis. This medicine may be used for other purposes; ask your health care provider or pharmacist if you have questions. COMMON BRAND NAME(S): Amvisc, DUROLANE, Euflexxa, GELSYN-3, Hyalgan, Hymovis, Monovisc, Orthovisc, Supartz, Supartz FX, TriVisc, VISCO What should I tell my health care provider before I take this medicine? They need to know if you have any of these conditions:  bleeding disorders  glaucoma  infection in the knee joint  skin conditions or sensitivity  skin infection  an unusual allergic reaction to sodium hyaluronate, other medicines, foods, dyes, or preservatives. Different brands of sodium hyaluronate contain different allergens. Some may contain egg. Talk to your doctor about your allergies to make sure that you get the right product.  pregnant or trying to get pregnant  breast-feeding How should I use this medicine? This medicine is for injection into the knee joint. It is given by a health care professional in a hospital or clinic setting. Talk to your pediatrician regarding the use of this medicine in children. Special care may be needed. Overdosage: If you think you have taken too much of this medicine contact a poison control center or emergency room at once. NOTE: This medicine is only for you. Do not share this medicine with others. What if I miss a dose? This does not apply. What may interact with this medicine? Interactions are not expected. This list may not describe all possible interactions. Give your health care provider a list of all the medicines, herbs, non-prescription drugs, or dietary supplements you use. Also tell them if you smoke, drink alcohol, or use illegal drugs. Some items may interact with your medicine. What should I watch for while using this medicine? Tell your  doctor or healthcare professional if your symptoms do not start to get better or if they get worse. If receiving this medicine for osteoarthritis, limit your activity after you receive your injection. Avoid physical activity for 48 hours following your injection to keep your knee from swelling. Do not stand on your feet for more than 1 hour at a time during the first 48 hours following your injection. Ask your doctor or healthcare professional about when you can begin major physical activity again. What side effects may I notice from receiving this medicine? Side effects that you should report to your doctor or health care professional as soon as possible:  allergic reactions like skin rash, itching or hives, swelling of the face, lips, or tongue  dizziness  facial flushing  pain, tingling, numbness in the hands or feet  vision changes if received this medicine during eye surgery Side effects that usually do not require medical attention (report to your doctor or health care professional if they continue or are bothersome):  back pain  bruising at site where injected  chills  diarrhea  fever  headache  joint pain  joint stiffness  joint swelling  muscle cramps  muscle pain  nausea, vomiting  pain, redness, or irritation at site where injected  weak or tired This list may not describe all possible side effects. Call your doctor for medical advice about side effects. You may report side effects to FDA at 1-800-FDA-1088. Where should I keep my medicine? This drug is given in a hospital or clinic and will not be stored at home. NOTE: This sheet is a summary. It may not cover all   possible information. If you have questions about this medicine, talk to your doctor, pharmacist, or health care provider.  2020 Elsevier/Gold Standard (2015-06-02 08:34:51) Back Exercises These exercises help to make your trunk and back strong. They also help to keep the lower back flexible.  Doing these exercises can help to prevent back pain or lessen existing pain.  If you have back pain, try to do these exercises 2-3 times each day or as told by your doctor.  As you get better, do the exercises once each day. Repeat the exercises more often as told by your doctor.  To stop back pain from coming back, do the exercises once each day, or as told by your doctor. Exercises Single knee to chest Do these steps 3-5 times in a row for each leg: 1. Lie on your back on a firm bed or the floor with your legs stretched out. 2. Bring one knee to your chest. 3. Grab your knee or thigh with both hands and hold them it in place. 4. Pull on your knee until you feel a gentle stretch in your lower back or buttocks. 5. Keep doing the stretch for 10-30 seconds. 6. Slowly let go of your leg and straighten it. Pelvic tilt Do these steps 5-10 times in a row: 1. Lie on your back on a firm bed or the floor with your legs stretched out. 2. Bend your knees so they point up to the ceiling. Your feet should be flat on the floor. 3. Tighten your lower belly (abdomen) muscles to press your lower back against the floor. This will make your tailbone point up to the ceiling instead of pointing down to your feet or the floor. 4. Stay in this position for 5-10 seconds while you gently tighten your muscles and breathe evenly.    Bridges Do these steps 10 times in a row: 1. Lie on your back on a firm surface. 2. Bend your knees so they point up to the ceiling. Your feet should be flat on the floor. Your arms should be flat at your sides, next to your body. 3. Tighten your butt muscles and lift your butt off the floor until your waist is almost as high as your knees. If you do not feel the muscles working in your butt and the back of your thighs, slide your feet 1-2 inches farther away from your butt. 4. Stay in this position for 3-5 seconds. 5. Slowly lower your butt to the floor, and let your butt muscles  relax. If this exercise is too easy, try doing it with your arms crossed over your chest.  Contact a doctor if:  Your back pain gets a lot worse when you do an exercise.  Your back pain does not get better 2 hours after you exercise. If you have any of these problems, stop doing the exercises. Do not do them again unless your doctor says it is okay. Get help right away if:  You have sudden, very bad back pain. If this happens, stop doing the exercises. Do not do them again unless your doctor says it is okay. This information is not intended to replace advice given to you by your health care provider. Make sure you discuss any questions you have with your health care provider. Document Revised: 01/23/2018 Document Reviewed: 01/23/2018 Elsevier Patient Education  2020 [employer].

## 2020-03-04 NOTE — Progress Notes (Signed)
Subjective:    Patient ID: Gina Conner, female    DOB: May 03, 1966, 54 y.o.   MRN: [id number]  HPI Pulmonary rehab completed, treadmill , weights, [employer] bike.  Going to [employer] [facility] 3 x per week , doing mostly stationary bike and light weights  Seeing podiatry for Left foot/Ankle tendinitis, has not tried walker boot yet Patient completed her bilateral hip x-rays as well as bilateral knee x-rays, we reviewed the x-rays as well as discussed the results. Primary complaint is knee pain on the right side.  This pain increases with walking.  This does limit her mobility to a certain extent but is still able to do all her housework.  In the past she has had gel injection to the knee at Dr. Anne Fu office.  She had weekly injections x3 and this was helpful for her. Pain Inventory Average Pain 7 Pain Right Now 6 My pain is sharp, burning, dull and aching  In the last 24 hours, has pain interfered with the following? General activity 0 Relation with others 0 Enjoyment of life 0 What TIME of day is your pain at its worst? morning , daytime, evening and night Sleep (in general) Poor  Pain is worse with: walking, sitting, standing and some activites Pain improves with: na Relief from Meds: na  Family History  Problem Relation Age of Onset  . Pulmonary embolism Mother    Social History   Socioeconomic History  . Marital status: Married    Spouse name: Not on file  . Number of children: Not on file  . Years of education: Not on file  . Highest education level: Not on file  Occupational History  . Not on file  Tobacco Use  . Smoking status: Never Smoker  . Smokeless tobacco: Never Used  Substance and Sexual Activity  . Alcohol use: No    Comment: occ  . Drug use: No  . Sexual activity: Yes  Other Topics Concern  . Not on file  Social History Narrative  . Not on file   Social Determinants of Health   Financial Resource Strain:   . Difficulty of Paying Living  Expenses: Not on file  Food Insecurity:   . Worried About Charity fundraiser in the Last Year: Not on file  . Ran Out of Food in the Last Year: Not on file  Transportation Needs:   . Lack of Transportation (Medical): Not on file  . Lack of Transportation (Non-Medical): Not on file  Physical Activity:   . Days of Exercise per Week: Not on file  . Minutes of Exercise per Session: Not on file  Stress:   . Feeling of Stress : Not on file  Social Connections:   . Frequency of Communication with Friends and Family: Not on file  . Frequency of Social Gatherings with Friends and Family: Not on file  . Attends Religious Services: Not on file  . Active Member of Clubs or Organizations: Not on file  . Attends Archivist Meetings: Not on file  . Marital Status: Not on file   Past Surgical History:  Procedure Laterality Date  . ABDOMINAL HYSTERECTOMY    . ACHILLES TENDON REPAIR    . EXCISION/RELEASE BURSA HIP Right 02/06/2017   Procedure: Right hip bursectomy;  Surgeon: Gaynelle Arabian, MD;  Location: [facility];  Service: Orthopedics;  Laterality: Right;  . ROUX-EN-Y GASTRIC BYPASS  2002   Past Surgical History:  Procedure Laterality Date  . ABDOMINAL HYSTERECTOMY    .  ACHILLES TENDON REPAIR    . EXCISION/RELEASE BURSA HIP Right 02/06/2017   Procedure: Right hip bursectomy;  Surgeon: Gaynelle Arabian, MD;  Location: [facility];  Service: Orthopedics;  Laterality: Right;  . ROUX-EN-Y GASTRIC BYPASS  2002   Past Medical History:  Diagnosis Date  . DVT (deep venous thrombosis) ([place])    provoked during tendon surgery was treated 3 months of coumadin  . Hypothyroidism    BP 126/83   Pulse 79   Temp 98 F (36.7 C)   Ht 5\' 7"  (1.702 m)   Wt (!) 307 lb 3.2 oz (139.3 kg)   SpO2 98%   BMI 48.11 kg/m   Opioid Risk Score:   Fall Risk Score:  `1  Depression screen PHQ 2/9  Depression screen [facility] 2/9 02/05/2020 01/28/2020 09/23/2019  Decreased Interest 1 0 0  Down, Depressed, Hopeless 0 0  1  PHQ - 2 Score 1 0 1  Altered sleeping 2 - 2  Tired, decreased energy 3 - 3  Change in appetite 2 - 1  Feeling bad or failure about yourself  0 - 1  Trouble concentrating 0 - 1  Moving slowly or fidgety/restless 0 - 0  Suicidal thoughts 0 - 0  PHQ-9 Score 8 - 9  Difficult doing work/chores Not difficult at all - Very difficult   Review of Systems     Objective:   Physical Exam Vitals and nursing note reviewed.  Constitutional:      Appearance: She is obese.  HENT:     Head: Normocephalic and atraumatic.  Eyes:     Extraocular Movements: Extraocular movements intact.     Conjunctiva/sclera: Conjunctivae normal.     Pupils: Pupils are equal, round, and reactive to light.  Musculoskeletal:        General: No swelling or tenderness.     Comments: No evidence of knee effusion bilaterally no evidence of valgus or varus deformity at the knees no evidence of knee instability with medial lateral testing. There is tenderness along the medial joint line on the right side greater than left side No pain with hip range of motion does have good hip range of motion Faber's is negative bilaterally  Skin:    General: Skin is warm and dry.  Neurological:     Mental Status: She is alert and oriented to person, place, and time.     Comments: Motor strength is 5/5 bilateral hip flexor knee extensor ankle dorsiflexor  Psychiatric:        Mood and Affect: Mood normal.        Behavior: Behavior normal.           Assessment & Plan:  #1.  Right knee osteoarthritis, she has bilateral medial joint line narrowing on x-ray as well as pain along the medial joint line to palpation on the right side greater than left side.  She is likely more symptomatic on the right side being right side dominant and doing more weightbearing on that side. We discussed treatment options, she wishes to avoid corticosteroids due to concerns about further weight gain.  She has had good results in the past with Synvisc  viscosupplementation injections.  We discussed that they can be now done with a single injection either Synvisc 1 or Monovisc.  Patient elects proceed we will do this under ultrasound guidance

## 2020-03-14 NOTE — Progress Notes (Signed)
Discharge Progress Report  Patient Details  Name: Gina Conner MRN: [id number] Date of Birth: [birth year] Referring Provider:     PULMONARY REHAB OTHER RESP ORIENTATION from 09/23/2019 in [place]  Referring Provider Dr. Shearon Stalls       Number of Visits: 34  Reason for Discharge:  Patient reached a stable level of exercise. Patient independent in their exercise. Patient has met program and personal goals.  Smoking History:  Social History   Tobacco Use  Smoking Status Never Smoker  Smokeless Tobacco Never Used    Diagnosis:  SOB (shortness of breath)  ADL UCSD:  Pulmonary Assessment Scores    Row Name 09/23/19 1137         ADL UCSD   ADL Phase Entry     SOB Score total 53     Rest 0     Walk 10     Stairs 4     Bath 2     Dress 2     Shop 3       CAT Score   CAT Score 20       mMRC Score   mMRC Score 3            Initial Exercise Prescription:  Initial Exercise Prescription - 09/23/19 1100      Date of Initial Exercise RX and Referring Provider   Date 09/23/19    Referring Provider Dr. Shearon Stalls    Expected Discharge Date 12/24/19      Treadmill   MPH 1.2    Grade 0    Minutes 17    METs 1.91      T5 Nustep   Level 1    SPM 73    Minutes 22    METs 1.7      Prescription Details   Frequency (times per week) 2    Duration Progress to 30 minutes of continuous aerobic without signs/symptoms of physical distress      Intensity   Ratings of Perceived Exertion 11-13    Perceived Dyspnea 0-4      Progression   Progression Continue progressive overload as per policy without signs/symptoms or physical distress.      Resistance Training   Training Prescription Yes    Weight 1    Reps 10-15           Discharge Exercise Prescription (Final Exercise Prescription Changes):  Exercise Prescription Changes - 02/16/20 1221      Response to Exercise   Blood Pressure (Admit) 118/62    Blood Pressure (Exercise) 148/66      Blood Pressure (Exit) 122/70    Heart Rate (Admit) 70 bpm    Heart Rate (Exercise) 102 bpm    Heart Rate (Exit) 87 bpm    Oxygen Saturation (Admit) 95 %    Oxygen Saturation (Exercise) 90 %    Oxygen Saturation (Exit) 97 %    Rating of Perceived Exertion (Exercise) 10    Perceived Dyspnea (Exercise) 10    Duration Continue with 30 min of aerobic exercise without signs/symptoms of physical distress.    Intensity THRR unchanged      Progression   Progression Continue to progress workloads to maintain intensity without signs/symptoms of physical distress.      Resistance Training   Training Prescription Yes    Weight 3 lbs    Reps 10-15      Treadmill   MPH 2.5    Grade 0    Minutes 17  METs 2.91      T5 Nustep   Level 4    SPM 97    Minutes 22    METs 2.5           Functional Capacity:  6 Minute Walk    Row Name 09/23/19 1139 02/23/20 1225       6 Minute Walk   Phase Initial Discharge    Distance 1200 feet 1750 feet    Distance % Change -- 45.83 %    Distance Feet Change -- 550 ft    Walk Time 6 minutes 6 minutes    # of Rest Breaks 0 0    MPH 2.27 3.31    METS 2.74 3.93    RPE 13 10    Perceived Dyspnea  13 10    VO2 Peak 9.44 13.76    Symptoms No Yes (comment)    Comments -- Right sided lower extremity pain 5/10    Resting HR 76 bpm 83 bpm    Resting BP 110/80 136/84    Resting Oxygen Saturation  97 % 98 %    Exercise Oxygen Saturation  during 6 min walk 94 % 92 %    Max Ex. HR 110 bpm 122 bpm    Max Ex. BP 146/106 160/72    2 Minute Post BP 110/98 128/76      Interval HR   1 Minute HR -- 102    2 Minute HR -- 110    3 Minute HR -- 116    4 Minute HR -- 122    5 Minute HR -- 117    6 Minute HR -- 117    2 Minute Post HR -- 93    Interval Heart Rate? -- Yes      Interval Oxygen   Interval Oxygen? -- Yes    Baseline Oxygen Saturation % -- 98 %    1 Minute Oxygen Saturation % -- 96 %    1 Minute Liters of Oxygen -- 0 L    2 Minute Oxygen  Saturation % -- 96 %    2 Minute Liters of Oxygen -- 0 L    3 Minute Oxygen Saturation % -- 95 %    3 Minute Liters of Oxygen -- 0 L    4 Minute Oxygen Saturation % -- 96 %    4 Minute Liters of Oxygen -- 0 L    5 Minute Oxygen Saturation % -- 95 %    5 Minute Liters of Oxygen -- 0 L    6 Minute Oxygen Saturation % -- 92 %    6 Minute Liters of Oxygen -- 0 L    2 Minute Post Oxygen Saturation % -- 100 %    2 Minute Post Liters of Oxygen -- 0 L           Psychological, QOL, Others - Outcomes: PHQ 2/9: Depression screen [facility] 2/9 02/05/2020 01/28/2020 09/23/2019  Decreased Interest 1 0 0  Down, Depressed, Hopeless 0 0 1  PHQ - 2 Score 1 0 1  Altered sleeping 2 - 2  Tired, decreased energy 3 - 3  Change in appetite 2 - 1  Feeling bad or failure about yourself  0 - 1  Trouble concentrating 0 - 1  Moving slowly or fidgety/restless 0 - 0  Suicidal thoughts 0 - 0  PHQ-9 Score 8 - 9  Difficult doing work/chores Not difficult at all - Very difficult    Quality of  Life:  Quality of Life - 09/23/19 1200      Quality of Life   Select Quality of Life      Quality of Life Scores   Health/Function Pre 13.31 %    Socioeconomic Pre 25.5 %    Psych/Spiritual Pre 15.43 %    Family Pre 30 %    GLOBAL Pre 18.14 %           Personal Goals: Goals established at orientation with interventions provided to work toward goal.  Personal Goals and Risk Factors at Admission - 09/23/19 1142      Core Components/Risk Factors/Patient Goals on Admission    Weight Management Yes    Intervention Weight Management/Obesity: Establish reasonable short term and long term weight goals.    Admit Weight 311 lb 4.8 oz (141.2 kg)    Goal Weight: Short Term 286 lb 4.8 oz (129.9 kg)    Goal Weight: Long Term 236 lb 4.8 oz (107.2 kg)    Expected Outcomes Short Term: Continue to assess and modify interventions until short term weight is achieved;Long Term: Adherence to nutrition and physical activity/exercise  program aimed toward attainment of established weight goal    Personal Goal Other Yes    Personal Goal Get lungs stronger, Lose weight, get off oxygen, be healthier overall.    Intervention Attend class 2 x week and to supplement with at home exercise plan that was given 3 x week.    Expected Outcomes Reach expected goals.            Personal Goals Discharge:  Goals and Risk Factor Review    Row Name 10/20/19 1244 11/18/19 0947 12/16/19 1428 01/13/20 1344 02/11/20 0731     Core Components/Risk Factors/Patient Goals Review   Personal Goals Review Weight Management/Obesity  Get stronger; be healthier. Weight Management/Obesity;Improve shortness of breath with ADL's  Get lungs stronger; be healthier. Weight Management/Obesity;Improve shortness of breath with ADL's  Get lungs stronger; be healthier. Weight Management/Obesity;Improve shortness of breath with ADL's  Get lungs stronger; be healthier. Weight Management/Obesity;Improve shortness of breath with ADL's   Review Patient has completed 8 sessions gaining 2 bls since her initial visit. She is doing well in the program with consistent attendance. She says she is starting to feel stronger now and has a little more energy. She feels her lungs are getting stronger. She enjoys getting out and have a routine exercise time. She feels like she is making progress toward meeting her personal goals. Will continue to monitor for progress. Patient has completed 16 sessions gaining 1 lb since last 30 day review. She continues to do well in the program with progression and consistent attendance. She continues to say she is feeling stronger and has more energy. She says her lungs are getting stronger and she continues to enjoy exercising. Will continue to monitor for progress. Patient has completed 23 sessions losing 4 lbs. She continues to do well in the program with progression and consistent attendance. She says she really enjoys coming here to exercise. She  reports feeling stronger and having more energy to keep up with her teenage children. She says she feels like her lungs are getting stronger and she is getting back to where she was before COVID. Will continue to montior for progress. Patient has completed 26 sessions losing 2 lbs since last 30 day review. She continues to do well in the program with progression. She has been absent since 12/24/19 due MD appointments and getting her children  settled starting back to school. She continues to go to [employer] 3 day/week in addition to PR. She says she feels stronger and in better shape than she did prior to getting COVID. She has more energy and is losing weight and is very pleased with her progress in the program. Will continue to monitor for progress. Patient has completed 32 sessions maintaining her weight since last 30 day review. She continues to do very well in the program with progression. She continues to exercise at [employer] on the days she is not in PR. 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 They started her in Vitamin C, D3, Zinc, and Quercetin. She reports feeling a lot stronger with increased energy and is breathing better. She is set to graduate 02/18/20. Will continue to monitor for progress.   Expected Outcomes Patient will continue to attend sessions and complete the program meeting both personal and program goals. Patient will continue to attend sessions and complete the program meeting both personal and program goals. Patient will continue to attend sessions and complete the program meeting both personal and program goals. Patient will continue to attend sessions and complete the program meeting both personal and program goals. Patient will continue to attend sessions and complete the program meeting both personal and program goals.   [place] Name 03/14/20 1441             Core Components/Risk Factors/Patient Goals Review   Personal Goals  Review Weight Management/Obesity;Improve shortness of breath with ADL's       Review Patient graduated with 34 sessions losing 5.75 lbs overall and lost inches in her waist and hip measurements. Her exit measurements improved in grip strength and balance. Her exit walk test improved by 45.85%. She did not complete her exit documentation. Her personal goals were to get her lungs in shape and be healthier and lose 50 lbs long term. She did meet program goals and says she feels like she has met her personal goals as well. She is already exercising at [employer] and plans to continue. PR will f/u with phone calls.       Expected Outcomes Patient will continue to exercise at [employer] and continue to meet her personal goals.              Exercise Goals and Review:  Exercise Goals    Row Name 09/23/19 1151 12/15/19 1247 01/13/20 0844 02/10/20 0907       Exercise Goals   Increase Physical Activity Yes Yes Yes Yes    Intervention Provide advice, education, support and counseling about physical activity/exercise needs.;Develop an individualized exercise prescription for aerobic and resistive training based on initial evaluation findings, risk stratification, comorbidities and participant's personal goals. Provide advice, education, support and counseling about physical activity/exercise needs.;Develop an individualized exercise prescription for aerobic and resistive training based on initial evaluation findings, risk stratification, comorbidities and participant's personal goals. Provide advice, education, support and counseling about physical activity/exercise needs.;Develop an individualized exercise prescription for aerobic and resistive training based on initial evaluation findings, risk stratification, comorbidities and participant's personal goals. Provide advice, education, support and counseling about physical activity/exercise needs.;Develop an individualized exercise prescription for aerobic  and resistive training based on initial evaluation findings, risk stratification, comorbidities and participant's personal goals.    Expected Outcomes Short Term: Attend rehab on a regular basis to increase amount of physical activity.;Long Term: Add in home exercise to make exercise part  of routine and to increase amount of physical activity. Short Term: Attend rehab on a regular basis to increase amount of physical activity.;Long Term: Add in home exercise to make exercise part of routine and to increase amount of physical activity.;Long Term: Exercising regularly at least 3-5 days a week. Short Term: Attend rehab on a regular basis to increase amount of physical activity.;Long Term: Add in home exercise to make exercise part of routine and to increase amount of physical activity.;Long Term: Exercising regularly at least 3-5 days a week. Short Term: Attend rehab on a regular basis to increase amount of physical activity.;Long Term: Add in home exercise to make exercise part of routine and to increase amount of physical activity.;Long Term: Exercising regularly at least 3-5 days a week.    Increase Strength and Stamina Yes Yes Yes Yes    Intervention Provide advice, education, support and counseling about physical activity/exercise needs.;Develop an individualized exercise prescription for aerobic and resistive training based on initial evaluation findings, risk stratification, comorbidities and participant's personal goals. Provide advice, education, support and counseling about physical activity/exercise needs.;Develop an individualized exercise prescription for aerobic and resistive training based on initial evaluation findings, risk stratification, comorbidities and participant's personal goals. Provide advice, education, support and counseling about physical activity/exercise needs.;Develop an individualized exercise prescription for aerobic and resistive training based on initial evaluation findings, risk  stratification, comorbidities and participant's personal goals. Provide advice, education, support and counseling about physical activity/exercise needs.;Develop an individualized exercise prescription for aerobic and resistive training based on initial evaluation findings, risk stratification, comorbidities and participant's personal goals.    Expected Outcomes Long Term: Improve cardiorespiratory fitness, muscular endurance and strength as measured by increased METs and functional capacity (6MWT);Short Term: Perform resistance training exercises routinely during rehab and add in resistance training at home Short Term: Increase workloads from initial exercise prescription for resistance, speed, and METs.;Short Term: Perform resistance training exercises routinely during rehab and add in resistance training at home Short Term: Increase workloads from initial exercise prescription for resistance, speed, and METs.;Short Term: Perform resistance training exercises routinely during rehab and add in resistance training at home Short Term: Increase workloads from initial exercise prescription for resistance, speed, and METs.;Short Term: Perform resistance training exercises routinely during rehab and add in resistance training at home    Able to understand and use rate of perceived exertion (RPE) scale Yes Yes Yes Yes    Intervention Provide education and explanation on how to use RPE scale Provide education and explanation on how to use RPE scale Provide education and explanation on how to use RPE scale Provide education and explanation on how to use RPE scale    Expected Outcomes Short Term: Able to use RPE daily in rehab to express subjective intensity level;Long Term:  Able to use RPE to guide intensity level when exercising independently Short Term: Able to use RPE daily in rehab to express subjective intensity level;Long Term:  Able to use RPE to guide intensity level when exercising independently Short Term:  Able to use RPE daily in rehab to express subjective intensity level;Long Term:  Able to use RPE to guide intensity level when exercising independently Short Term: Able to use RPE daily in rehab to express subjective intensity level;Long Term:  Able to use RPE to guide intensity level when exercising independently    Able to understand and use Dyspnea scale Yes Yes Yes Yes    Intervention Provide education and explanation on how to use Dyspnea scale Provide education  and explanation on how to use Dyspnea scale Provide education and explanation on how to use Dyspnea scale Provide education and explanation on how to use Dyspnea scale    Expected Outcomes Short Term: Able to use Dyspnea scale daily in rehab to express subjective sense of shortness of breath during exertion;Long Term: Able to use Dyspnea scale to guide intensity level when exercising independently Short Term: Able to use Dyspnea scale daily in rehab to express subjective sense of shortness of breath during exertion;Long Term: Able to use Dyspnea scale to guide intensity level when exercising independently Short Term: Able to use Dyspnea scale daily in rehab to express subjective sense of shortness of breath during exertion;Long Term: Able to use Dyspnea scale to guide intensity level when exercising independently Short Term: Able to use Dyspnea scale daily in rehab to express subjective sense of shortness of breath during exertion;Long Term: Able to use Dyspnea scale to guide intensity level when exercising independently    Knowledge and understanding of Target Heart Rate Range (THRR) Yes Yes Yes Yes    Intervention Provide education and explanation of THRR including how the numbers were predicted and where they are located for reference Provide education and explanation of THRR including how the numbers were predicted and where they are located for reference Provide education and explanation of THRR including how the numbers were predicted and  where they are located for reference Provide education and explanation of THRR including how the numbers were predicted and where they are located for reference    Expected Outcomes Long Term: Able to use THRR to govern intensity when exercising independently;Short Term: Able to state/look up THRR Short Term: Able to state/look up THRR;Long Term: Able to use THRR to govern intensity when exercising independently;Short Term: Able to use daily as guideline for intensity in rehab Short Term: Able to state/look up THRR;Long Term: Able to use THRR to govern intensity when exercising independently;Short Term: Able to use daily as guideline for intensity in rehab Short Term: Able to state/look up THRR;Long Term: Able to use THRR to govern intensity when exercising independently;Short Term: Able to use daily as guideline for intensity in rehab    Able to check pulse independently Yes -- -- --    Intervention Provide education and demonstration on how to check pulse in carotid and radial arteries.;Review the importance of being able to check your own pulse for safety during independent exercise -- -- --    Expected Outcomes Short Term: Able to explain why pulse checking is important during independent exercise;Long Term: Able to check pulse independently and accurately -- -- --    Understanding of Exercise Prescription Yes Yes Yes Yes    Intervention Provide education, explanation, and written materials on patient's individual exercise prescription Provide education, explanation, and written materials on patient's individual exercise prescription Provide education, explanation, and written materials on patient's individual exercise prescription Provide education, explanation, and written materials on patient's individual exercise prescription    Expected Outcomes Short Term: Able to explain program exercise prescription;Long Term: Able to explain home exercise prescription to exercise independently Short Term: Able to  explain program exercise prescription;Long Term: Able to explain home exercise prescription to exercise independently Short Term: Able to explain program exercise prescription;Long Term: Able to explain home exercise prescription to exercise independently Short Term: Able to explain program exercise prescription;Long Term: Able to explain home exercise prescription to exercise independently           Exercise Goals Re-Evaluation:  Exercise Goals Re-Evaluation    [place] Name 10/12/19 1252 11/13/19 1451 12/15/19 1249 01/13/20 0844 02/10/20 0908     Exercise Goal Re-Evaluation   Exercise Goals Review Increase Physical Activity;Increase Strength and Stamina Increase Physical Activity;Increase Strength and Stamina Increase Physical Activity;Increase Strength and Stamina;Able to understand and use rate of perceived exertion (RPE) scale;Able to understand and use Dyspnea scale;Knowledge and understanding of Target Heart Rate Range (THRR);Understanding of Exercise Prescription;Improve claudication pain tolerance and improve walking ability Increase Physical Activity;Increase Strength and Stamina;Able to understand and use rate of perceived exertion (RPE) scale;Able to understand and use Dyspnea scale;Knowledge and understanding of Target Heart Rate Range (THRR);Understanding of Exercise Prescription Increase Physical Activity;Increase Strength and Stamina;Able to understand and use rate of perceived exertion (RPE) scale;Able to understand and use Dyspnea scale;Knowledge and understanding of Target Heart Rate Range (THRR);Understanding of Exercise Prescription   Comments Patients goals are to get lungs stronger, be healthier and to lose 50lbs. Patient feels that she is getting stronger with each visit. She says she is already able to do more at home and while shopping. Patient is not losing any weight. She has gained 2lbs. We will talk to her about her weight lose goals and to discuss her diet. We will continue to  monitor her progress. She is breathing and feeling better so far.The program has motivated her to lose weight and live a better lifestyle. She also feels better about exercising on her own since her oxygen levls have improved. She tolerates increased workloads well. She works hard towards her goals each session. Patient said that she feels good and heathier. She is still working on the weight loss. She is exercising at home and has changed her diet. She pushes herself and others in class to reach their goals. She has a steady progression so far. She mention still feeling some tightness in her throat and will adress that with her doctor. We will continue to progress as tolerated. Pt has attended 25 exercise sessions. She has been absent from the program since 12/24/19. She has been working out at [employer] in order to exercise 3-5 days per week. She is feeling much stronger. She currently exercises at 3.1 METs on the stepper. Will continue to monitor and progress as able. Pt has attended 31 exercises sessions. She continues to exercise at [employer] multiple times a week on her days off. She will be graduating from the program soon. She continues to feel stronger. She is currently exercising at 2.3 METs on the stepper. WIll continue to monitor and progress as able.   Expected Outcomes To reach her expected goals. To reach expected goals To reach expected goals Through exercise at rehab and by engaging in a home exercise program, the pt will reach their goals. Through exercise at rehab and by engaging in a home exercise program, the pt will reach their goals.          Nutrition & Weight - Outcomes:  Pre Biometrics - 09/23/19 1156      Pre Biometrics   Height _0  (1.702 m)    Weight 141.2 kg    Waist Circumference 55 inches    Hip Circumference 60 inches    Waist to Hip Ratio 0.92 %    BMI (Calculated) 48.75    Triceps Skinfold 15 mm    % Body Fat 52.3 %    Grip Strength 25.53 kg     Flexibility 14.33 in    Single Leg Stand 9.29 seconds  Post Biometrics - 02/23/20 1230       Post  Biometrics   Height _0  (1.702 m)    Weight 137.5 kg    Waist Circumference 54 inches    Hip Circumference 59.5 inches    Waist to Hip Ratio 0.91 %    BMI (Calculated) 47.47    Triceps Skinfold 25 mm    % Body Fat 54 %    Grip Strength 39.6 kg    Flexibility 30 in    Single Leg Stand 32.01 seconds           Nutrition:  Nutrition Therapy & Goals - 02/11/20 0730      Personal Nutrition Goals   Comments Patient continues to say she trys to heart healthy. Will continue to monitor.      Intervention Plan   Intervention Nutrition handout(s) given to patient.           Nutrition Discharge:  Nutrition Assessments - 09/23/19 1141      MEDFICTS Scores   Pre Score 35           Education Questionnaire Score:  Knowledge Questionnaire Score - 09/23/19 1141      Knowledge Questionnaire Score   Pre Score 14/18         Patient graduated from [facility] today on 02/23/20 after completing 34 sessions. They achieved LTG of 30 minutes of aerobic exercise at Max Met level of 2.91.  All patients vitals are WNL. Discharge instruction has been reviewed in detail and patient stated an understanding of material given. Patient plans to [facility] at [facility]. Pulmonary Rehab staff will make f/u call. Patient had no complaints of any abnormal S/S or pain on their exit visit.   Goals reviewed with patient; copy given to patient.

## 2020-03-14 NOTE — Addendum Note (Signed)
Encounter addended by: Dwana Melena, RN on: 03/14/2020 2:51 PM  Actions taken: Flowsheet data copied forward, Flowsheet accepted, Clinical Note Signed, Episode resolved

## 2020-03-18 ENCOUNTER — Encounter: Payer: Self-pay | Admitting: Physical Medicine & Rehabilitation

## 2020-03-18 ENCOUNTER — Encounter
Payer: Commercial Managed Care - PPO | Attending: Physical Medicine & Rehabilitation | Admitting: Physical Medicine & Rehabilitation

## 2020-03-18 ENCOUNTER — Other Ambulatory Visit: Payer: Self-pay

## 2020-03-18 VITALS — BP 128/81 | HR 88 | Temp 98.4°F | Ht 67.0 in | Wt 302.0 lb

## 2020-03-18 DIAGNOSIS — M170 Bilateral primary osteoarthritis of knee: Secondary | ICD-10-CM | POA: Insufficient documentation

## 2020-03-18 NOTE — Patient Instructions (Signed)
Knee Injection A knee injection is a procedure to get medicine into your knee joint to relieve the pain, swelling, and stiffness of arthritis. Your health care provider uses a needle to inject medicine, which may also help to lubricate and cushion your knee joint. You may need more than one injection. Tell a health care provider about:  Any allergies you have.  All medicines you are taking, including vitamins, herbs, eye drops, creams, and over-the-counter medicines.  Any problems you or family members have had with anesthetic medicines.  Any blood disorders you have.  Any surgeries you have had.  Any medical conditions you have.  Whether you are pregnant or may be pregnant. What are the risks? Generally, this is a safe procedure. However, problems may occur, including:  Infection.  Bleeding.  Symptoms that get worse.  Damage to the area around your knee.  Allergic reaction to any of the medicines.  Skin reactions from repeated injections. What happens before the procedure?  Ask your health care provider about changing or stopping your regular medicines. This is especially important if you are taking diabetes medicines or blood thinners.  Plan to have someone take you home from the hospital or clinic. What happens during the procedure?   You will sit or lie down in a position for your knee to be treated.  The skin over your kneecap will be cleaned with a germ-killing soap.  You will be given a medicine that numbs the area (local anesthetic). You may feel some stinging.  The medicine will be injected into your knee. The needle is carefully placed between your kneecap and your knee. The medicine is injected into the joint space.  The needle will be removed at the end of the procedure.  A bandage (dressing) may be placed over the injection site. The procedure may vary among health care providers and hospitals. What can I expect after the procedure?  Your blood  pressure, heart rate, breathing rate, and blood oxygen level will be monitored until you leave the hospital or clinic.  You may have to move your knee through its full range of motion. This helps to get all the medicine into your joint space.  You will be watched to make sure that you do not have a reaction to the injected medicine.  You may feel more pain, swelling, and warmth than you did before the injection. This reaction may last about 1-2 days. Follow these instructions at home: Medicines  Take over-the-counter and prescription medicines only as told by your doctor.  Do not drive or use heavy machinery while taking prescription pain medicine.  Do not take medicines such as aspirin and ibuprofen unless your health care provider tells you to take them. Injection site care  Follow instructions from your health care provider about: ? How to take care of your puncture site. ? When and how you should change your dressing. ? When you should remove your dressing.  Check your injection area every day for signs of infection. Check for: ? More redness, swelling, or pain after 2 days. ? Fluid or blood. ? Pus or a bad smell. ? Warmth. Managing pain, stiffness, and swelling   If directed, put ice on the injection area: ? Put ice in a plastic bag. ? Place a towel between your skin and the bag. ? Leave the ice on for 20 minutes, 2-3 times per day.  Do not apply heat to your knee.  Raise (elevate) the injection area above the level   of your heart while you are sitting or lying down. General instructions  If you were given a dressing, keep it dry until your health care provider says it can be removed. Ask your health care provider when you can start showering or taking a bath.  Avoid strenuous activities for as long as directed by your health care provider. Ask your health care provider when you can return to your normal activities.  Keep all follow-up visits as told by your health  care provider. This is important. You may need more injections. Contact a health care provider if you have:  A fever.  Warmth in your injection area.  Fluid, blood, or pus coming from your injection site.  Symptoms at your injection site that last longer than 2 days after your procedure. Get help right away if:  Your knee: ? Turns very red. ? Becomes very swollen. ? Is in severe pain. Summary  A knee injection is a procedure to get medicine into your knee joint to relieve the pain, swelling, and stiffness of arthritis.  A needle is carefully placed between your kneecap and your knee to inject medicine into the joint space.  Before the procedure, ask your health care provider about changing or stopping your regular medicines, especially if you are taking diabetes medicines or blood thinners.  Contact your health care provider if you have any problems or questions after your procedure. This information is not intended to replace advice given to you by your health care provider. Make sure you discuss any questions you have with your health care provider. Document Revised: 05/20/2017 Document Reviewed: 05/20/2017 Elsevier Patient Education  2020 [employer]  

## 2020-03-18 NOTE — Progress Notes (Signed)
Indication end-stage osteoarthritis of the knee with pain that limits mobility and does not respond to oral medications.  Ultrasound guidance, 12 Hz linear transducer, long axis view  Medial aspect of the knee was imaged, identified joint space, identified patella, femur, tibia. 25-gauge 1.5 inch needle was inserted under ultrasound guidance and 3 mL of 1% lidocaine were infiltrated into the skin and subcutaneous tissue. Then a 21-gauge, 2 inch needle was inserted along the same needle track Into the joint under direct ultrasound visualization. 3 cc of joint fluid were removed and discarded. 6 mL of monovisc-1 were injected. Patient tolerated procedure well Post procedure instructions given

## 2020-03-29 ENCOUNTER — Ambulatory Visit (INDEPENDENT_AMBULATORY_CARE_PROVIDER_SITE_OTHER): Payer: Commercial Managed Care - PPO | Admitting: Nurse Practitioner

## 2020-03-29 VITALS — BP 122/80 | HR 67 | Temp 97.3°F | Ht 67.0 in | Wt 299.0 lb

## 2020-03-29 DIAGNOSIS — R52 Pain, unspecified: Secondary | ICD-10-CM

## 2020-03-29 DIAGNOSIS — R519 Headache, unspecified: Secondary | ICD-10-CM | POA: Diagnosis not present

## 2020-03-29 DIAGNOSIS — R413 Other amnesia: Secondary | ICD-10-CM | POA: Diagnosis not present

## 2020-03-29 DIAGNOSIS — R200 Anesthesia of skin: Secondary | ICD-10-CM

## 2020-03-29 DIAGNOSIS — Z8616 Personal history of COVID-19: Secondary | ICD-10-CM | POA: Diagnosis not present

## 2020-03-29 MED ORDER — DULOXETINE HCL 20 MG PO CPEP: 20.0000 mg | ORAL_CAPSULE | Freq: Every day | ORAL | 0 refills | Status: DC

## 2020-03-29 MED ORDER — PREDNISONE 10 MG PO TABS: ORAL_TABLET | ORAL | 0 refills | Status: DC

## 2020-03-29 NOTE — Assessment & Plan Note (Signed)
Memory loss Numbness to right hand and foot Headaches:  Keep appointment with neurology  May start magnesium 600 mg daily for headache    Body aches:  Stay active  Will order labs   Will place referral to PMR  Stay well hydrated   Will order prednisone  Recommend Gluten diet   Anxiety:  Will order Cymbalta for anxiety and pain     Follow up:  Follow up in 2 month or sooner if needed

## 2020-03-29 NOTE — Patient Instructions (Addendum)
History of COVID-19 Memory loss Numbness to right hand and foot Headaches:  Keep appointment with neurology  May start magnesium 600 mg daily for headache    Body aches:  Stay active  Will order labs   Will place referral to PMR  Stay well hydrated   Will order prednisone  Recommend Gluten diet   Anxiety:  Will order Cymbalta for anxiety and pain     Follow up:  Follow up in 2 month or sooner if needed

## 2020-03-29 NOTE — Progress Notes (Signed)
@Patient  ID: Gina Conner, female    DOB: August 20, 1965, 54 y.o.   MRN: [id number]  Chief Complaint  Patient presents with  . Follow-up    2 month follow up, still fatigue, coughing spells.     Referring provider: Manon Hilding, MD   54 year old female with past history of DVT.  Diagnosed with Covid 05/03/2019.   HPI  Patient presents today for post COVID care clinic visit follow-up.  Patient was last seen here on 01/28/2020.  She was referred to PMR for multiple joint pain and to neurology for memory loss, numbness to right hand and foot, headaches.  She does have an appointment scheduled with neurology in a couple weeks.  She has been to see PMR and did have an injection in her right knee.  She did have imaging on her hips which revealed a bone spur to one of her hips.  She states that her knee is feeling much better.  She has been trying to work out and lose weight.  She has finished pulmonary rehab.  She states that she is still having ongoing issues with memory loss, numbness to right hand and, headaches, body aches/joint pain.  Patient also states that she has been having increased anxiety since Covid. Denies f/c/s, n/v/d, hemoptysis, PND, chest pain or edema.       No Known Allergies  Immunization History  Administered Date(s) Administered  . Influenza,inj,Quad PF,6+ Mos 02/15/2020  . Tdap 02/18/2020    Past Medical History:  Diagnosis Date  . DVT (deep venous thrombosis) ([place])    provoked during tendon surgery was treated 3 months of coumadin  . Hypothyroidism     Tobacco History: Social History   Tobacco Use  Smoking Status Never Smoker  Smokeless Tobacco Never Used   Counseling given: Yes   Outpatient Encounter Medications as of 03/29/2020  Medication Sig  . acetaminophen (TYLENOL) 500 MG tablet Take 1,000 mg by mouth every 6 (six) hours as needed for mild pain or headache.   . albuterol (VENTOLIN HFA) 108 (90 Base) MCG/ACT inhaler Inhale 2 puffs into  the lungs every 6 (six) hours as needed for wheezing or shortness of breath.  . ALPRAZolam (XANAX) 0.5 MG tablet Take 0.5 mg by mouth at bedtime as needed for sleep.  . Cholecalciferol (D 5000) 5000 units capsule Take 5,000 Units by mouth daily.  Marland Kitchen ipratropium-albuterol (DUONEB) 0.5-2.5 (3) MG/3ML SOLN Take 3 mLs by nebulization every 6 (six) hours as needed (shortness of breath).  Marland Kitchen levothyroxine (SYNTHROID) 88 MCG tablet Take 88 mcg by mouth daily before breakfast. Pt unsure of strength.   . Multiple Vitamin (MULTIVITAMIN) tablet Take 1 tablet by mouth daily.  . progesterone (PROMETRIUM) 100 MG capsule Take 100 mg by mouth at bedtime.  Marland Kitchen spironolactone (ALDACTONE) 25 MG tablet Take 25 mg by mouth at bedtime.  . Zinc Acetate, Oral, (ZINC ACETATE PO) Take by mouth.  . DULoxetine (CYMBALTA) 20 MG capsule Take 1 capsule (20 mg total) by mouth daily.  . predniSONE (DELTASONE) 10 MG tablet Take 4 tabs for 2 days, then 3 tabs for 2 days, then 2 tabs for 2 days, then 1 tab for 2 days, then stop   No facility-administered encounter medications on file as of 03/29/2020.     Review of Systems  Review of Systems  Constitutional: Positive for fatigue.  HENT: Negative.   Respiratory: Positive for cough. Negative for shortness of breath.   Cardiovascular: Negative.  Negative for chest  pain, palpitations and leg swelling.  Gastrointestinal: Negative.   Musculoskeletal: Positive for arthralgias and myalgias.  Allergic/Immunologic: Negative.   Neurological: Positive for headaches.  Psychiatric/Behavioral: Positive for confusion and decreased concentration.       Physical Exam  BP 122/80 (BP Location: Left Arm)   Pulse 67   Temp (!) 97.3 F (36.3 C)   Ht 5\' 7"  (1.702 m)   Wt 299 lb 0.1 oz (135.6 kg)   SpO2 98%   BMI 46.83 kg/m   Wt Readings from Last 5 Encounters:  03/29/20 299 lb 0.1 oz (135.6 kg)  03/18/20 (!) 302 lb (137 kg)  03/04/20 (!) 307 lb 3.2 oz (139.3 kg)  02/23/20 (!) 303  lb 2.1 oz (137.5 kg)  02/18/20 (!) 306 lb (138.8 kg)     Physical Exam Vitals and nursing note reviewed.  Constitutional:      General: She is not in acute distress.    Appearance: She is well-developed.  Cardiovascular:     Rate and Rhythm: Normal rate and regular rhythm.  Pulmonary:     Effort: Pulmonary effort is normal.     Breath sounds: Normal breath sounds.  Musculoskeletal:     Right lower leg: No edema.     Left lower leg: No edema.  Neurological:     Mental Status: She is alert and oriented to person, place, and time.  Psychiatric:        Mood and Affect: Mood normal.        Behavior: Behavior normal.        Assessment & Plan:   History of COVID-19 Memory loss Numbness to right hand and foot Headaches:  Keep appointment with neurology  May start magnesium 600 mg daily for headache    Body aches:  Stay active  Will order labs   Will place referral to PMR  Stay well hydrated   Will order prednisone  Recommend Gluten diet   Anxiety:  Will order Cymbalta for anxiety and pain     Follow up:  Follow up in 2 month or sooner if needed      Fenton Foy, NP 03/29/2020

## 2020-04-11 NOTE — Progress Notes (Signed)
[facility]    Provider:  Dr Jaynee Eagles Requesting Provider: Fenton Foy, NP Primary Care Provider:  Manon Hilding, MD  CC:  numbness  HPI:  Gina Conner is a 54 y.o. female here as requested by Fenton Foy, NP for multiple symptoms after covid including headaches, body aches, toe and finger numbness and memory issues. PMHx DVT, hypothyroidism, respiratory failure due to Covid-19, myalgias.  I reviewed Gina Conner notes: Patient tested positive in December 2020 and was hospitalized for Covid, reports since that she has ongoing issues, shortness of breath, headaches, numbness of right hand and right foot, memory changes, hoarse voice, pain throughout her body but especially in her lower extremities, fatigue.  Gina Conner physical examination was normal.  She was started on magnesium for headache, referral placed to neurology, asked to log her headaches, conservative measures.  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  He is following with x-rays.  She was referred to outpatient physical therapy.  Bilateral knee pain may be osteoarthritis, in the past she had gel injections to the knee at Dr. Jacquiline Doe office, this was helpful for her in the past and they elected to proceed with this under ultrasound guidance which was completed earlier this month.  Bilateral Achilles tendinitis, seeing podiatry, she was referred to physical therapy.  X-rays were ordered and reviewed with patient.  Gina Conner also thought she was back to good functional level but likely has some muscle imbalance issues for the months that she was sedentary, she continues to follow with Gina Conner.  Numbness started 28th of December after covid. Numbness is in the 3rd-4th right fingers and radiates up  to the elbow. Not coming from the neck no neck pain or shooting pain from the neck. Stable. Happened after being int e hospital and sedentary for a while. No weakness. Worse at night. All the toes on the right foot are numb, happened after covid and being sedentary, no low back pain, she has achilles tendinitis on the left leg, on the right has had surgery but only started after covid. Starts at the side of the leg and radiates to the top of the toes. No weakness. No low back pain or radiation into the legs. No other focal neurologic deficits, associated symptoms, inciting events or modifiable factors.  Reviewed notes, labs and imaging from outside physicians, which showed: see above  TSH normal, B12 651 normal  Review of Systems: Patient complains of symptoms per HPI as well as the following symptoms: headache, numbness, weakness, snoring, decreased energy, joint pain. Pertinent negatives and positives per HPI. All others negative.   Social History   Socioeconomic History  . Marital status: Married    Spouse name: Not on file  . Number of children: Not on file  . Years of education: Not on file  . Highest education level: Not on file  Occupational History  . Not on file  Tobacco Use  . Smoking status: Never Smoker  . Smokeless tobacco: Never Used  Substance and Sexual Activity  . Alcohol use: No    Comment: occ  . Drug use: No  . Sexual activity: Yes  Other Topics Concern  . Not on file  Social History Narrative  . Not on file   Social Determinants of Health   Financial Resource Strain:   .  Difficulty of Paying Living Expenses: Not on file  Food Insecurity:   . Worried About Charity fundraiser in the Last Year: Not on file  . Ran Out of Food in the Last Year: Not on file  Transportation Needs:   . Lack of Transportation (Medical): Not on file  . Lack of Transportation (Non-Medical): Not on file  Physical Activity:   . Days of Exercise per Week: Not on file  . Minutes of  Exercise per Session: Not on file  Stress:   . Feeling of Stress : Not on file  Social Connections:   . Frequency of Communication with Friends and Family: Not on file  . Frequency of Social Gatherings with Friends and Family: Not on file  . Attends Religious Services: Not on file  . Active Member of Clubs or Organizations: Not on file  . Attends Archivist Meetings: Not on file  . Marital Status: Not on file  Intimate Partner Violence:   . Fear of Current or Ex-Partner: Not on file  . Emotionally Abused: Not on file  . Physically Abused: Not on file  . Sexually Abused: Not on file    Family History  Problem Relation Age of Onset  . Pulmonary embolism Mother   . Migraines Mother     Past Medical History:  Diagnosis Date  . DVT (deep venous thrombosis) ([place])    provoked during tendon surgery was treated 3 months of coumadin  . Hypothyroidism   . Migraines    30 years ago     Patient Active Problem List   Diagnosis Date Noted  . 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 Ulnar neuropathy at elbow of right upper extremity 04/12/2020  . Problems influencing health status 02/18/2020  . Disorder of skeletal system 02/18/2020  . History of COVID-19 01/28/2020  . Myalgia 01/28/2020  . Memory loss 01/28/2020  . Numbness 01/28/2020  . Nonintractable headache 01/28/2020  . Body aches 01/28/2020  . Pneumonia due to COVID-19 virus 10/26/2019  . Chronic respiratory failure with hypoxia ([place]) 10/26/2019  . Dyspnea 10/26/2019  . Acute hypoxemic respiratory failure due to COVID-19 ([place]) 05/19/2019  . Hypothyroidism 05/19/2019  . Acute respiratory failure due to COVID-19 ([place]) 05/19/2019  . 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 ABDOMINAL HYSTERECTOMY    . ACHILLES TENDON REPAIR    . EXCISION/RELEASE BURSA HIP Right 02/06/2017   Procedure: Right hip bursectomy;  Surgeon: Gaynelle Arabian, MD;  Location: [facility];  Service: Orthopedics;  Laterality: Right;  . ROUX-EN-Y GASTRIC BYPASS  2002    Current Outpatient Medications  Medication Sig Dispense Refill  . albuterol (VENTOLIN HFA) 108 (90 Base) MCG/ACT inhaler Inhale 2 puffs into the lungs every 6 (six) hours as needed for wheezing or shortness of breath. 6.7 g 0  . ALPRAZolam (XANAX) 0.5 MG tablet Take 0.5 mg by mouth at bedtime as needed for sleep.    . Cholecalciferol (D 5000) 5000 units capsule Take 5,000 Units by mouth daily.    . DULoxetine (CYMBALTA) 20 MG capsule Take 1 capsule (20 mg total) by mouth daily. 30 capsule 0  . ipratropium-albuterol (DUONEB) 0.5-2.5 (3) MG/3ML SOLN Take 3 mLs by nebulization every 6 (six) hours as needed (shortness of breath). 360 mL 3  . levothyroxine (SYNTHROID) 88 MCG tablet Take 88 mcg by mouth daily before breakfast. Pt unsure of strength.     Marland Kitchen  Multiple Vitamin (MULTIVITAMIN) tablet Take 1 tablet by mouth daily.    . progesterone (PROMETRIUM) 100 MG capsule Take 100 mg by mouth at bedtime.    Marland Kitchen spironolactone (ALDACTONE) 25 MG tablet Take 25 mg by mouth at bedtime.    . Zinc Acetate, Oral, (ZINC ACETATE PO) Take by mouth.     No current facility-administered medications for this visit.    Allergies as of 04/12/2020  . (No Known Allergies)    Vitals: BP 115/71   Pulse 78   Ht 5\' 8"  (1.727 m)   Wt 293 lb (132.9 kg)   BMI 44.55 kg/m  Last Weight:  Wt Readings from Last 1 Encounters:  04/12/20 293 lb (132.9 kg)   Last Height:   Ht Readings from Last 1 Encounters:  04/12/20 5\' 8"  (1.727 m)     Physical exam: Exam: Gen: NAD, conversant, well nourised, obese, well groomed                     CV: RRR, no MRG. No Carotid Bruits. No peripheral edema, warm, nontender Eyes: Conjunctivae clear without exudates or hemorrhage  Neuro: Detailed Neurologic Exam  Speech:    Speech is normal; fluent and spontaneous with normal comprehension.  Cognition:  MMSE - Mini  Mental State Exam 04/12/2020  Orientation to time 5  Orientation to Place 5  Registration 3  Attention/ Calculation 1  Recall 3  Language- name 2 objects 2  Language- repeat 0  Language- follow 3 step command 3  Language- read & follow direction 1  Write a sentence 1  Copy design 1  Total score 25       The patient is oriented to person, place, and time;     recent and remote memory intact;     language fluent;     normal attention, concentration,     fund of knowledge Cranial Nerves:    The pupils are equal, round, and reactive to light. The fundi are flat. Visual fields are full to finger confrontation. Extraocular movements are intact. Trigeminal sensation is intact and the muscles of mastication are normal. The face is symmetric. The palate elevates in the midline. Hearing intact. Voice is normal. Shoulder shrug is normal. The tongue has normal motion without fasciculations.   Coordination:  No dysmetria or ataxia  Gait:  normal native gait  Motor Observation:    No asymmetry, no atrophy, and no involuntary movements noted. Tone:    Normal muscle tone.    Posture:    Posture is normal. normal erect    Strength:    Strength is V/V in the upper and lower limbs.      Sensation: intact to LT     Reflex Exam:  DTR's: Hypo but symmetrical AJs otherwise deep tendon reflexes in the upper and lower extremities are normal bilaterally.   Toes:    The toes are downgoing bilaterally.   Clonus:    Clonus is absent.    Assessment/Plan:  54 year old developed likely Ulnar neuropathy and peroneal neuropathy after extended stay in hospital and sedentary period.  [facility] Bible at [facility]. Evaluate and treat for right ulnar neuropathy and right peroneal neuropathy developed after long stay in the ICU due to covid likely compression neuropathies, stable.  Discussed we could perform emg/ncs, patient want to try conservative therapies, she will call if no improvement  or worsening symptoms  (Summary of Gina Conner recent appointments: bilateral hip pain consistent with history of  trochanteric bursitis, also positive Faber's in the groin area bilaterally and given her morbid obesity, osteoarthritis of the hips may also be a pain generator.  He is following with x-rays.  She was referred to outpatient physical therapy.  Bilateral knee pain may be osteoarthritis, in the past she had gel injections to the knee at Dr. Jacquiline Doe office, this was helpful for her in the past and they elected to proceed with this under ultrasound guidance which was completed earlier this month.  Bilateral Achilles tendinitis, seeing podiatry, she was referred to physical therapy.  X-rays were ordered and reviewed with patient.  Gina Conner also thought she was back to good functional level but likely has some muscle imbalance issues for the months that she was sedentary, she continues to follow with Gina Conner.)  Orders Placed This Encounter  Procedures  . Ambulatory referral to Physical Therapy   No orders of the defined types were placed in this encounter.   Cc: Fenton Foy, NP,  Sasser, Silvestre Moment, MD. Dr. June Leap, MD  [facility] [address] [place], [place] [address]  Phone [phone] Fax [phone]  I spent over 60 minutes of face-to-face and non-face-to-face time with patient on the  1. Ulnar neuropathy at elbow of right upper extremity   2. 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  This included previsit chart review, lab review, study review, order entry, electronic health record documentation, patient education on the different diagnostic and therapeutic options, counseling and coordination of care, risks and benefits of management, compliance, or risk factor reduction

## 2020-04-12 ENCOUNTER — Encounter: Payer: Self-pay | Admitting: Neurology

## 2020-04-12 ENCOUNTER — Ambulatory Visit (INDEPENDENT_AMBULATORY_CARE_PROVIDER_SITE_OTHER): Payer: Commercial Managed Care - PPO | Admitting: Neurology

## 2020-04-12 VITALS — BP 115/71 | HR 78 | Ht 68.0 in | Wt 293.0 lb

## 2020-04-12 DIAGNOSIS — G5621 Lesion of ulnar nerve, right upper limb: Secondary | ICD-10-CM | POA: Diagnosis not present

## 2020-04-12 DIAGNOSIS — G5701 Lesion of sciatic nerve, right lower limb: Secondary | ICD-10-CM

## 2020-04-12 NOTE — Patient Instructions (Signed)
Gina Conner at [facility]. 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 The condition results from pressure on a nerve in the lower leg called the common peroneal nerve. Your common peroneal nerve provides feeling to your outer lower leg and foot. It also supplies the muscles that move your foot and toes upward and outward. What are the causes? This condition may be caused by:  Sitting cross-legged, squatting, or kneeling for long periods of time.  A hard, direct hit to the side of the lower leg.  Swelling from a knee injury.  A break (fracture) in one of the lower leg bones.  Wearing a boot or cast that ends just below the knee.  A growth or cyst near the nerve. What increases the risk? This condition is more likely to develop in people who play:  Contact sports, such as football or hockey.  Sports where you wear high and stiff boots, such as skiing. What are the signs or symptoms? Symptoms of this condition include:  Trouble lifting your foot up (foot drop).  Tripping often.  Your foot hitting the ground harder than normal as you walk.  Numbness, tingling, or pain in the outside of the knee, outside of the lower leg, and top of the foot.  Sensitivity to pressure on the front or side of the leg. How is this diagnosed? This condition may be diagnosed based on:  Your symptoms.  Your medical history.  A physical exam.  Tests, such as: ? An X-ray to check the bones of your knee and leg. ? MRI to check tendons that attach to the side of your knee. ? An ultrasound to check for a growth or cyst. ? An electromyogram (EMG) to check your nerves. During your physical exam, your health care provider will check for numbness in  your leg and test the strength of your lower leg muscles. He or she may tap the side of your lower leg to see if that causes tingling. How is this treated? Treatment for this condition may include:  Avoiding activities that make symptoms worse.  Using a brace to hold up your foot and toes.  Taking anti-inflammatory pain medicines to relieve swelling and lessen pain.  Having medicines injected into your ankle joint to lessen pain and swelling.  Doing exercises to help you regain or maintain movement (physical therapy).  Surgery to take pressure off the nerve. This may be needed if there is no improvement after 2-3 months or if there is a growth pushing on the nerve.  Returning gradually to full activity. Follow these instructions at home: If you have a brace:  Wear it as told by your health care provider. Remove it only as told by your health care provider.  Loosen the brace if your toes tingle, become numb, or turn cold and blue.  Keep the brace clean.  If the brace is not waterproof: ? Do not let it get wet. ? Cover it with a watertight covering when you take a bath or a shower.  Ask your health care provider when it is safe to drive with a brace on your foot. Activity  Return to your normal activities as told by your health care provider. Ask your health care provider what activities are safe  for you.  Do not do any activities that make pain or swelling worse.  Do exercises as told by your health care provider. General instructions  Take over-the-counter and prescription medicines only as told by your health care provider.  Do not put your full weight on your knee until your health care provider says you can. Use crutches as directed by your health care provider.  Keep all follow-up visits as told by your health care provider. This is important. How is this prevented?  Wear supportive footwear that is appropriate for your athletic activity.  Avoid athletic  activities that cause ankle pain or swelling.  Wear protective padding over your lower legs when playing contact sports.  Make sure your boots do not put extra pressure on the area just below your knees.  Do not sit cross-legged for long periods of time. Contact a health care provider if:  Your symptoms do not get better in 2-3 months.  The weakness or numbness in your leg or foot gets worse. Summary  Common peroneal nerve entrapment is a condition that results from pressure on a nerve in the lower leg called the common peroneal nerve.  This condition may be caused by a hard hit, swelling, a fracture, or a cyst in the lower leg.  Treatment may include rest, a brace, medicines, and physical therapy. Sometimes surgery is needed.  Do not do any activities that make pain or swelling worse. This information is not intended to replace advice given to you by your health care provider. Make sure you discuss any questions you have with your health care provider. Document Revised: 03/10/2018 Document Reviewed: 03/10/2018 Elsevier Patient Education  2020 [name].     Ulnar neuropathy Syndrome  Cubital tunnel syndrome is a condition that causes pain and weakness of the forearm and hand. It happens when one of the nerves that runs along the inside of the elbow joint (ulnar nerve) becomes irritated. This condition is usually caused by repeated arm motions that are done during sports or work-related activities. What are the causes? This condition may be caused by:  Increased pressure on the ulnar nerve at the elbow, arm, or forearm. This can result from: ? Irritation caused by repeated elbow bending. ? Poorly healed elbow fractures. ? Tumors in the elbow. These are usually noncancerous (benign). ? Scar tissue that develops in the elbow after an injury. ? Bony growths (spurs) near the ulnar nerve.  Stretching of the nerve due to loose elbow ligaments.  Trauma to the nerve at the  elbow. What increases the risk? The following factors may make you more likely to develop this condition:  Doing manual labor that requires frequent bending of the elbow.  Playing sports that include repeated or strenuous throwing motions, such as baseball.  Playing contact sports, such as football or lacrosse.  Not warming up properly before activities.  Having diabetes.  Having an underactive thyroid (hypothyroidism). What are the signs or symptoms? Symptoms of this condition include:  Clumsiness or weakness of the hand.  Tenderness of the inner elbow.  Aching or soreness of the inner elbow, forearm, or fingers, especially the little finger or the ring finger.  Increased pain when forcing the elbow to bend.  Reduced control when throwing objects.  Tingling, numbness, or a burning feeling inside the forearm or in part of the hand or fingers, especially the little finger or the ring finger.  Sharp pains that shoot from the elbow down to the wrist and hand.  The inability to grip or pinch hard. How is this diagnosed? This condition is diagnosed based on:  Your symptoms and medical history. Your health care provider will also ask for details about any injury.  A physical exam. You may also have tests, including:  Electromyogram (EMG). This test measures electrical signals sent by your nerves into the muscles.  Nerve conduction study. This test measures how well electrical signals pass through your nerves.  Imaging tests, such as X-rays, ultrasound, and MRI. These tests check for possible causes of your condition. How is this treated? This condition may be treated by:  Stopping the activities that are causing your symptoms to get worse.  Icing and taking medicines to reduce pain and swelling.  Wearing a splint to prevent your elbow from bending, or wearing an elbow pad where the ulnar nerve is closest to the skin.  Working with a physical therapist in less severe  cases. This may help to: ? Decrease your symptoms. ? Improve the strength and range of motion of your elbow, forearm, and hand. If these treatments do not help, surgery may be needed. Follow these instructions at home: If you have a splint:  Wear the splint as told by your health care provider. Remove it only as told by your health care provider.  Loosen the splint if your fingers tingle, become numb, or turn cold and blue.  Keep the splint clean.  If the splint is not waterproof: ? Do not let it get wet. ? Cover it with a watertight covering when you take a bath or shower. Managing pain, stiffness, and swelling   If directed, put ice on the injured area: ? Put ice in a plastic bag. ? Place a towel between your skin and the bag. ? Leave the ice on for 20 minutes, 2-3 times a day.  Move your fingers often to avoid stiffness and to lessen swelling.  Raise (elevate) the injured area above the level of your heart while you are sitting or lying down. General instructions  Take over-the-counter and prescription medicines only as told by your health care provider.  Do any exercise or physical therapy as told by your health care provider.  Do not drive or use heavy machinery while taking prescription pain medicine.  If you were given an elbow pad, wear it as told by your health care provider.  Keep all follow-up visits as told by your health care provider. This is important. Contact a health care provider if:  Your symptoms get worse.  Your symptoms do not get better with treatment.  You have new pain.  Your hand on the injured side feels numb or cold. Summary  Cubital tunnel syndrome is a condition that causes pain and weakness of the forearm and hand.  You are more likely to develop this condition if you do work or play sports that involve repeated arm movements.  This condition is often treated by stopping repetitive activities, applying ice, and using  anti-inflammatory medicines.  In rare cases, surgery may be needed. This information is not intended to replace advice given to you by your health care provider. Make sure you discuss any questions you have with your health care provider. Document Revised: 09/16/2017 Document Reviewed: 09/16/2017 Elsevier Patient Education  [place].

## 2020-05-02 ENCOUNTER — Ambulatory Visit (INDEPENDENT_AMBULATORY_CARE_PROVIDER_SITE_OTHER): Payer: Commercial Managed Care - PPO | Admitting: Nurse Practitioner

## 2020-05-02 ENCOUNTER — Other Ambulatory Visit: Payer: Self-pay

## 2020-05-02 VITALS — HR 73 | Temp 97.1°F

## 2020-05-02 DIAGNOSIS — R52 Pain, unspecified: Secondary | ICD-10-CM

## 2020-05-02 DIAGNOSIS — R413 Other amnesia: Secondary | ICD-10-CM

## 2020-05-02 DIAGNOSIS — Z8616 Personal history of COVID-19: Secondary | ICD-10-CM | POA: Diagnosis not present

## 2020-05-02 MED ORDER — DULOXETINE HCL 30 MG PO CPEP: 30.0000 mg | ORAL_CAPSULE | Freq: Every day | ORAL | 0 refills | Status: DC

## 2020-05-02 NOTE — Patient Instructions (Addendum)
History of COVID-19 Memory loss Body aches:  Stay active  Stay well hydrated  Recommend Gluten diet   Anxiety:  Will increase Cymbalta dosage for anxiety and pain   Follow up:  Follow up in 2 months or sooner if needed

## 2020-05-02 NOTE — Assessment & Plan Note (Signed)
Memory loss Body aches:  Stay active  Stay well hydrated  Recommend Gluten diet   Anxiety:  Will increase Cymbalta dosage for anxiety and pain   Follow up:  Follow up in 2 months or sooner if needed

## 2020-05-02 NOTE — Progress Notes (Signed)
@Patient  ID: Gina Conner, female    DOB: 05/11/1966, 54 y.o.   MRN: [id number]  Chief Complaint  Patient presents with  . Follow-up    Improving     Referring provider: Manon Hilding, MD   54 year old female withpasthistory ofDVT. Diagnosed with Covid 05/03/2019.    HPI  Patient presents today for post COVID care clinic visit/follow-up.  Patient states that she has been improving since her last visit here on 03/29/2020.  She has seen neurology for the numbness to her right hand and foot.  She states that her memory is improving she did have cognitive testing completed through neurology.  They stated that her cognitive testing was normal.  Patient has been compliant with her diet and exercise.  She has now lost 20 pounds.  She continues to work on diet and exercise.  She feels like this is helping to relieve some of her symptoms.  Patient was started on low-dose Cymbalta at last visit to help with anxiety and pain.  She states that she feels like this is helping but may need increase in dose.  Denies f/c/s, n/v/d, hemoptysis, PND, chest pain or edema.       No Known Allergies  Immunization History  Administered Date(s) Administered  . Influenza,inj,Quad PF,6+ Mos 02/15/2020  . Tdap 02/18/2020    Past Medical History:  Diagnosis Date  . DVT (deep venous thrombosis) ([place])    provoked during tendon surgery was treated 3 months of coumadin  . Hypothyroidism   . Migraines    30 years ago     Tobacco History: Social History   Tobacco Use  Smoking Status Never Smoker  Smokeless Tobacco Never Used   Counseling given: Yes   Outpatient Encounter Medications as of 05/02/2020  Medication Sig  . albuterol (VENTOLIN HFA) 108 (90 Base) MCG/ACT inhaler Inhale 2 puffs into the lungs every 6 (six) hours as needed for wheezing or shortness of breath.  . ALPRAZolam (XANAX) 0.5 MG tablet Take 0.5 mg by mouth at bedtime as needed for sleep.  . Cholecalciferol (D 5000)  5000 units capsule Take 5,000 Units by mouth daily.  . DULoxetine (CYMBALTA) 30 MG capsule Take 1 capsule (30 mg total) by mouth daily.  Marland Kitchen ipratropium-albuterol (DUONEB) 0.5-2.5 (3) MG/3ML SOLN Take 3 mLs by nebulization every 6 (six) hours as needed (shortness of breath).  Marland Kitchen levothyroxine (SYNTHROID) 88 MCG tablet Take 88 mcg by mouth daily before breakfast. Pt unsure of strength.   . Multiple Vitamin (MULTIVITAMIN) tablet Take 1 tablet by mouth daily.  . progesterone (PROMETRIUM) 100 MG capsule Take 100 mg by mouth at bedtime.  Marland Kitchen spironolactone (ALDACTONE) 25 MG tablet Take 25 mg by mouth at bedtime.  . Zinc Acetate, Oral, (ZINC ACETATE PO) Take by mouth.  . [DISCONTINUED] DULoxetine (CYMBALTA) 20 MG capsule Take 1 capsule (20 mg total) by mouth daily.   No facility-administered encounter medications on file as of 05/02/2020.     Review of Systems  Review of Systems  Constitutional: Positive for fatigue. Negative for fever.  HENT: Negative.   Respiratory: Negative for cough and shortness of breath.   Cardiovascular: Negative.  Negative for chest pain, palpitations and leg swelling.  Gastrointestinal: Negative.   Allergic/Immunologic: Negative.   Neurological: Negative.   Psychiatric/Behavioral: Negative.        Physical Exam  Pulse 73   Temp (!) 97.1 F (36.2 C)   SpO2 96% Comment: RA  Wt Readings from Last 5 Encounters:  04/12/20 293 lb (132.9 kg)  03/29/20 299 lb 0.1 oz (135.6 kg)  03/18/20 (!) 302 lb (137 kg)  03/04/20 (!) 307 lb 3.2 oz (139.3 kg)  02/23/20 (!) 303 lb 2.1 oz (137.5 kg)     Physical Exam Vitals and nursing note reviewed.  Constitutional:      General: She is not in acute distress.    Appearance: She is well-developed and well-nourished.  Cardiovascular:     Rate and Rhythm: Normal rate and regular rhythm.  Pulmonary:     Effort: Pulmonary effort is normal.     Breath sounds: Normal breath sounds.  Musculoskeletal:     Right lower leg: No  edema.     Left lower leg: No edema.  Neurological:     Mental Status: She is alert and oriented to person, place, and time.  Psychiatric:        Mood and Affect: Mood and affect and mood normal.        Behavior: Behavior normal.        Assessment & Plan:   History of COVID-19 Memory loss Body aches:  Stay active  Stay well hydrated  Recommend Gluten diet   Anxiety:  Will increase Cymbalta dosage for anxiety and pain   Follow up:  Follow up in 2 months or sooner if needed      Fenton Foy, NP 05/02/2020

## 2020-05-19 ENCOUNTER — Ambulatory Visit: Payer: Commercial Managed Care - PPO | Admitting: Nurse Practitioner

## 2020-06-03 ENCOUNTER — Telehealth: Payer: Self-pay | Admitting: Nurse Practitioner

## 2020-06-03 NOTE — Telephone Encounter (Signed)
Patient called to request a refill on their medication: DULoxetine (CYMBALTA) 30 MG capsule [place] PCP establish care appointment: 09/22/2020 Dr. Andres Labrum  Next scheduled appt [facility] 07/05/19.

## 2020-06-07 ENCOUNTER — Other Ambulatory Visit: Payer: Self-pay | Admitting: Nurse Practitioner

## 2020-06-07 MED ORDER — DULOXETINE HCL 30 MG PO CPEP: 30.0000 mg | ORAL_CAPSULE | Freq: Every day | ORAL | 0 refills | Status: DC

## 2020-06-28 ENCOUNTER — Encounter: Payer: Commercial Managed Care - PPO | Admitting: Physical Medicine & Rehabilitation

## 2020-07-04 ENCOUNTER — Ambulatory Visit (INDEPENDENT_AMBULATORY_CARE_PROVIDER_SITE_OTHER): Payer: Commercial Managed Care - PPO | Admitting: Nurse Practitioner

## 2020-07-04 ENCOUNTER — Telehealth: Payer: Self-pay | Admitting: Internal Medicine

## 2020-07-04 VITALS — BP 132/75 | HR 64 | Temp 97.1°F | Resp 18

## 2020-07-04 DIAGNOSIS — Z8616 Personal history of COVID-19: Secondary | ICD-10-CM | POA: Diagnosis not present

## 2020-07-04 NOTE — Patient Instructions (Addendum)
History of COVID-19 Memory loss Body aches:   Glad you are improving  Keep follow up with PMR  Stay active  Stay well hydrated  Recommend Gluten-free diet  Will request sleep study - already a patient with pulmonary   Anxiety:  Continue Cymbalta for anxiety and pain   Follow up:  Follow up in 3 months or sooner if needed

## 2020-07-04 NOTE — Progress Notes (Signed)
@Patient  ID: Gina Conner, female    DOB: 1965/11/20, 55 y.o.   MRN: [id number]  Chief Complaint  Patient presents with  . Follow-up    Slowly improving    Referring provider: Manon Hilding, MD   55 year old female with past history of DVT.  Diagnosed with Covid 05/03/2019.  HPI  Patient presents today for post COVID care clinic visit / follow up.  She tested positive in December 2020 and was hospitalized. Patient was last seen in our office on 05/02/20.  She states that since that time she continues to have ongoing issues including headaches, numbness to right hand and right foot, and pain, and anxiety.  She has seen pulmonary, neurology, and PMR. She is trying to stay active.  She states that she does get fatigued quickly when trying to exercise. Patient was started on Cymbalta and states that since her last visit everything is improving including pain. She does still get headaches in the mornings. She states that she does snore. We discussed that she may need sleep eval. Denies f/c/s, n/v/d, hemoptysis, PND, chest pain or edema.     No Known Allergies  Immunization History  Administered Date(s) Administered  . Influenza,inj,Quad PF,6+ Mos 02/15/2020  . Tdap 02/18/2020    Past Medical History:  Diagnosis Date  . DVT (deep venous thrombosis) ([place])    provoked during tendon surgery was treated 3 months of coumadin  . Hypothyroidism   . Migraines    30 years ago     Tobacco History: Social History   Tobacco Use  Smoking Status Never Smoker  Smokeless Tobacco Never Used   Counseling given: Yes   Outpatient Encounter Medications as of 07/04/2020  Medication Sig  . albuterol (VENTOLIN HFA) 108 (90 Base) MCG/ACT inhaler Inhale 2 puffs into the lungs every 6 (six) hours as needed for wheezing or shortness of breath.  . ALPRAZolam (XANAX) 0.5 MG tablet Take 0.5 mg by mouth at bedtime as needed for sleep.  . Cholecalciferol (D 5000) 5000 units capsule Take 5,000 Units  by mouth daily.  . DULoxetine (CYMBALTA) 30 MG capsule Take 1 capsule (30 mg total) by mouth daily.  Marland Kitchen ipratropium-albuterol (DUONEB) 0.5-2.5 (3) MG/3ML SOLN Take 3 mLs by nebulization every 6 (six) hours as needed (shortness of breath).  Marland Kitchen levothyroxine (SYNTHROID) 88 MCG tablet Take 88 mcg by mouth daily before breakfast. Pt unsure of strength.   . Multiple Vitamin (MULTIVITAMIN) tablet Take 1 tablet by mouth daily.  . progesterone (PROMETRIUM) 100 MG capsule Take 100 mg by mouth at bedtime.  Marland Kitchen spironolactone (ALDACTONE) 25 MG tablet Take 25 mg by mouth at bedtime.  . Zinc Acetate, Oral, (ZINC ACETATE PO) Take by mouth.   No facility-administered encounter medications on file as of 07/04/2020.     Review of Systems  Review of Systems  Constitutional: Positive for fatigue.  HENT: Negative.   Respiratory: Negative for cough and shortness of breath.   Cardiovascular: Negative.   Gastrointestinal: Negative.   Musculoskeletal: Positive for myalgias.  Allergic/Immunologic: Negative.   Neurological: Negative.   Psychiatric/Behavioral: Positive for decreased concentration.       Physical Exam  BP 132/75   Pulse 64   Temp (!) 97.1 F (36.2 C)   Resp 18   SpO2 98%   Wt Readings from Last 5 Encounters:  04/12/20 293 lb (132.9 kg)  03/29/20 299 lb 0.1 oz (135.6 kg)  03/18/20 (!) 302 lb (137 kg)  03/04/20 (!) 307 lb  3.2 oz (139.3 kg)  02/23/20 (!) 303 lb 2.1 oz (137.5 kg)     Physical Exam Vitals and nursing note reviewed.  Constitutional:      General: She is not in acute distress.    Appearance: She is well-developed and well-nourished.  Cardiovascular:     Rate and Rhythm: Normal rate and regular rhythm.  Pulmonary:     Effort: Pulmonary effort is normal.     Breath sounds: Normal breath sounds.  Neurological:     Mental Status: She is alert and oriented to person, place, and time.  Psychiatric:        Mood and Affect: Mood and affect and mood normal.         Behavior: Behavior normal.        Assessment & Plan:   History of COVID-19 Memory loss Body aches:   Glad you are improving  Keep follow up with PMR  Stay active  Stay well hydrated  Recommend Gluten-free diet  Will request sleep study - already a patient with pulmonary   Anxiety:  Continue Cymbalta for anxiety and pain   Follow up:  Follow up in 3 months or sooner if needed     Fenton Foy, NP 07/04/2020

## 2020-07-04 NOTE — Assessment & Plan Note (Signed)
Memory loss Body aches:   Glad you are improving  Keep follow up with PMR  Stay active  Stay well hydrated  Recommend Gluten-free diet  Will request sleep study - already a patient with pulmonary   Anxiety:  Continue Cymbalta for anxiety and pain   Follow up:  Follow up in 3 months or sooner if needed

## 2020-07-06 ENCOUNTER — Other Ambulatory Visit: Payer: Self-pay | Admitting: Nurse Practitioner

## 2020-07-06 ENCOUNTER — Telehealth: Payer: Self-pay | Admitting: Nurse Practitioner

## 2020-07-06 NOTE — Telephone Encounter (Signed)
Patient scheduled with Dr. Shearon Stalls on 07/18/2020-pr

## 2020-07-07 ENCOUNTER — Other Ambulatory Visit: Payer: Self-pay | Admitting: Nurse Practitioner

## 2020-07-07 MED ORDER — DULOXETINE HCL 30 MG PO CPEP
30.0000 mg | ORAL_CAPSULE | Freq: Every day | ORAL | 0 refills | Status: DC
Start: 2020-07-07 — End: 2020-10-20

## 2020-07-07 NOTE — Telephone Encounter (Signed)
This medication has been sent to the pharmacy  ?

## 2020-07-18 ENCOUNTER — Encounter: Payer: Self-pay | Admitting: Internal Medicine

## 2020-07-18 ENCOUNTER — Other Ambulatory Visit: Payer: Self-pay

## 2020-07-18 ENCOUNTER — Ambulatory Visit (INDEPENDENT_AMBULATORY_CARE_PROVIDER_SITE_OTHER): Payer: Commercial Managed Care - PPO | Admitting: Internal Medicine

## 2020-07-18 VITALS — BP 130/80 | HR 88 | Temp 97.2°F | Ht 67.0 in | Wt 289.0 lb

## 2020-07-18 DIAGNOSIS — G4733 Obstructive sleep apnea (adult) (pediatric): Secondary | ICD-10-CM

## 2020-07-18 MED ORDER — ALBUTEROL SULFATE HFA 108 (90 BASE) MCG/ACT IN AERS
2.0000 | INHALATION_SPRAY | Freq: Four times a day (QID) | RESPIRATORY_TRACT | 3 refills | Status: DC | PRN
Start: 2020-07-18 — End: 2021-08-09

## 2020-07-18 NOTE — Patient Instructions (Signed)
The patient should have follow up scheduled with myself in 3 months.   Prior to next visit patient should have: Sleep apnea test

## 2020-07-18 NOTE — Progress Notes (Signed)
Gina Conner    [id number]    [birth year]  Primary Care Physician:Sasser, Silvestre Moment, MD Date of Appointment: 07/18/2020 Established Patient Visit  Chief complaint:   Chief Complaint  Patient presents with  . Follow-up    Tightness in throat off and on since she had covid.  Needs refill of Albuterol.     HPI: Gina Conner is a 55 y.o., never smoker who had covid 44 infection Dec 2020. Since then with ongoing dyspnea.   Interval Updates: Completed pulmonary rehab. Feeling better from her covid infection.  Takes albuterol inhaler less than every two weeks for dyspnea. Did have passive smoke exposure at work at the [place] office.   Here with concerns for OSA. She note  ESS 9  OBSTRUCTIVE SLEEP APNEA SCREENING  1.  Snoring?:  yes 2.  Tired?:  yes 3.  Observed apnea, stop breathing or choking/gasping during sleep?:  no 4.  Pressure. HTN history?  yes 5.  BMI more than 35 kg/m2?  yes 6.  Age more than 3 yrs?  yes 7.  Neck size larger than 17 in for female or 16 in for female?  yes 8.  Gender = Female?  no  Total:  6  For general population  OSA - Low Risk : Yes to 0 - 2 questions OSA - Intermediate Risk : Yes to 3 - 4 questions OSA - High Risk : Yes to 5 - 8 questions  or Yes to 2 or more of 4 STOP questions + female gender or Yes to 2 or more of 4 STOP questions + BMI > 35kg/m2  or Yes to 2 or more of 4 STOP questions + neck circumference 17 inches / 43cm in female or 16 inches / 41cm in female  References: Rinaldo Cloud al. Anesthesiology 2008; 108: 812-821,  Gabriel Cirri et al Br Dara Hoyer 2012; 108: 619-509,  Gabriel Cirri et al J Clin Sleep Med Sept 2014.   I have reviewed the patient's family social and past medical history and updated as appropriate.   Past Medical History:  Diagnosis Date  . DVT (deep venous thrombosis) ([place])    provoked during tendon surgery was treated 3 months of coumadin  . Hypothyroidism   . Migraines    30 years ago     Past  Surgical History:  Procedure Laterality Date  . ABDOMINAL HYSTERECTOMY    . ACHILLES TENDON REPAIR    . EXCISION/RELEASE BURSA HIP Right 02/06/2017   Procedure: Right hip bursectomy;  Surgeon: Gaynelle Arabian, MD;  Location: [facility];  Service: Orthopedics;  Laterality: Right;  . ROUX-EN-Y GASTRIC BYPASS  2002    Family History  Problem Relation Age of Onset  . Pulmonary embolism Mother   . Migraines Mother     Social History   Occupational History  . Not on file  Tobacco Use  . Smoking status: Never Smoker  . Smokeless tobacco: Never Used  Substance and Sexual Activity  . Alcohol use: No    Comment: occ  . Drug use: No  . Sexual activity: Yes    Physical Exam: Blood pressure 130/80, pulse 88, temperature (!) 97.2 F (36.2 C), temperature source Temporal, height 5\' 7"  (1.702 m), weight 289 lb (131.1 kg), SpO2 97 %.  Gen:      No acute distress ENT:       Mallampati III Lungs:    No increased respiratory effort, symmetric chest wall excursion,  clear to auscultation bilaterally, no wheezes or crackles CV:         Regular rate and rhythm; no murmurs, rubs, or gallops.  No pedal edema   Data Reviewed: Imaging: I have personally reviewed the chest xray June 2021 which shows no acute pulmonary disease.  PFTs:  PFT Results Latest Ref Rng & Units 10/21/2019  FVC-Pre L 3.42  FVC-Predicted Pre % 88  FVC-Post L 3.42  FVC-Predicted Post % 88  Pre FEV1/FVC % % 88  Post FEV1/FCV % % 89  FEV1-Pre L 3.01  FEV1-Predicted Pre % 98  FEV1-Post L 3.04  DLCO uncorrected ml/min/mmHg 18.11  DLCO UNC% % 78  DLCO corrected ml/min/mmHg 18.11  DLCO COR %Predicted % 78  DLVA Predicted % 95  TLC L 4.51  TLC % Predicted % 81  RV % Predicted % 55   I have personally reviewed the patient's PFTs and they show normal pulmonary function.  Labs: Lab Results  Component Value Date   WBC 8.6 01/28/2020   HGB 13.0 01/28/2020   HCT 40.0 01/28/2020   MCV 87 01/28/2020   PLT 331 01/28/2020    Lab Results  Component Value Date   NA 139 02/18/2020   K 4.3 02/18/2020   CL 101 02/18/2020   CO2 24 01/28/2020    Immunization status: Immunization History  Administered Date(s) Administered  . Influenza,inj,Quad PF,6+ Mos 02/15/2020  . Tdap 02/18/2020    Assessment:  Dyspnea on exertion, post covid infection Concern for OSA   Plan/Recommendations: Will order HSAT Continue albuterol as needed.   Return to Care: Return in about 3 months (around 10/18/2020).   Lenice Llamas, MD [facility]

## 2020-07-19 ENCOUNTER — Encounter
Payer: Commercial Managed Care - PPO | Attending: Physical Medicine & Rehabilitation | Admitting: Physical Medicine & Rehabilitation

## 2020-07-19 ENCOUNTER — Other Ambulatory Visit: Payer: Self-pay

## 2020-07-19 ENCOUNTER — Encounter: Payer: Self-pay | Admitting: Physical Medicine & Rehabilitation

## 2020-07-19 VITALS — BP 132/84 | HR 67 | Temp 98.7°F | Ht 67.0 in | Wt 291.0 lb

## 2020-07-19 DIAGNOSIS — M7661 Achilles tendinitis, right leg: Secondary | ICD-10-CM

## 2020-07-19 DIAGNOSIS — M170 Bilateral primary osteoarthritis of knee: Secondary | ICD-10-CM | POA: Insufficient documentation

## 2020-07-19 DIAGNOSIS — M7662 Achilles tendinitis, left leg: Secondary | ICD-10-CM | POA: Insufficient documentation

## 2020-07-19 NOTE — Patient Instructions (Signed)
Toe raises and slow lowering 3 sets of 15 rep

## 2020-07-19 NOTE — Progress Notes (Signed)
Subjective:    Patient ID: Gina Conner, female    DOB: Oct 07, 1965, 55 y.o.   MRN: [id number]  HPI  55 year old female with history of debility after Covid, "long Covid", who returns today to evaluate knee pain, right side. The patient has a greater than 60-month history of knee pain.  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   Electronically Signed   By: Kathreen Devoid   On: 02/12/2020 15:26  The patient is doing very well on her exercise program and has increased her frequency from 3 times per week to 5 times per week. Going to [employer] 5x per wks, doing [employer], dumbells, occ treadmill (limited by Left ankle)  , occ elliptical   Right knee Monovisc in 03/2020, the patient feels like this is still working quite well.  We discussed that the usual duration is around 6 months but is difficult to predict   Started on Cymbalta, the patient feels like this has helped with several of her usual pain complaints.  We discussed that Cymbalta is FDA approved for multiple pain indications in addition to depression  Patient also complains of left posterior heel pain, no recent trauma to the area.  Pain increases with walking. Pain Inventory Average Pain 0 Pain Right Now 0 My pain is no pain at the moment  In the last 24 hours, has pain interfered with the following? General activity 0 Relation with others 0 Enjoyment of life 0 What TIME of day is your pain at its worst? varies Sleep (in general) Fair  Pain is worse with: no pain at the moment Pain improves with: no pain at the moment Relief from Meds: no pain at the moment  Family History  Problem Relation Age of Onset  . Pulmonary embolism Mother   . Migraines Mother    Social History   Socioeconomic History  . Marital status: Married     Spouse name: Not on file  . Number of children: Not on file  . Years of education: Not on file  . Highest education level: Not on file  Occupational History  . Not on file  Tobacco Use  . Smoking status: Never Smoker  . Smokeless tobacco: Never Used  Substance and Sexual Activity  . Alcohol use: No    Comment: occ  . Drug use: No  . Sexual activity: Yes  Other Topics Concern  . Not on file  Social History Narrative  . Not on file   Social Determinants of Health   Financial Resource Strain: Not on file  Food Insecurity: Not on file  Transportation Needs: Not on file  Physical Activity: Not on file  Stress: Not on file  Social Connections: Not on file   Past Surgical History:  Procedure Laterality Date  . ABDOMINAL HYSTERECTOMY    . ACHILLES TENDON REPAIR    . EXCISION/RELEASE BURSA HIP Right 02/06/2017   Procedure: Right hip bursectomy;  Surgeon: Gaynelle Arabian, MD;  Location: [facility];  Service: Orthopedics;  Laterality: Right;  . ROUX-EN-Y GASTRIC BYPASS  2002   Past Surgical History:  Procedure Laterality Date  . ABDOMINAL HYSTERECTOMY    . ACHILLES TENDON REPAIR    . EXCISION/RELEASE BURSA HIP Right 02/06/2017   Procedure: Right hip bursectomy;  Surgeon: Gaynelle Arabian, MD;  Location: [facility];  Service: Orthopedics;  Laterality: Right;  . ROUX-EN-Y  GASTRIC BYPASS  2002   Past Medical History:  Diagnosis Date  . DVT (deep venous thrombosis) ([place])    provoked during tendon surgery was treated 3 months of coumadin  . Hypothyroidism   . Migraines    30 years ago    BP 132/84   Pulse 67   Temp 98.7 F (37.1 C)   Ht 5\' 7"  (1.702 m)   Wt 291 lb (132 kg)   SpO2 96%   BMI 45.58 kg/m   Opioid Risk Score:   Fall Risk Score:  `1  Depression screen PHQ 2/9  Depression screen [facility] 2/9 03/18/2020 02/05/2020 01/28/2020 09/23/2019  Decreased Interest 0 1 0 0  Down, Depressed, Hopeless 0 0 0 1  PHQ - 2 Score 0 1 0 1  Altered sleeping - 2 - 2  Tired, decreased energy - 3  - 3  Change in appetite - 2 - 1  Feeling bad or failure about yourself  - 0 - 1  Trouble concentrating - 0 - 1  Moving slowly or fidgety/restless - 0 - 0  Suicidal thoughts - 0 - 0  PHQ-9 Score - 8 - 9  Difficult doing work/chores - Not difficult at all - Very difficult    Review of Systems  Constitutional: Negative.   HENT: Negative.   Eyes: Negative.   Respiratory: Negative.   Cardiovascular: Negative.   Gastrointestinal: Negative.   Endocrine: Negative.   Genitourinary: Negative.   Musculoskeletal: Negative.   Skin: Negative.   Allergic/Immunologic: Negative.   Neurological: Negative.   Hematological: Negative.   Psychiatric/Behavioral: Negative.   All other systems reviewed and are negative.      Objective:   Physical Exam Vitals and nursing note reviewed.  Constitutional:      Appearance: She is obese.  HENT:     Head: Normocephalic and atraumatic.  Eyes:     Extraocular Movements: Extraocular movements intact.     Conjunctiva/sclera: Conjunctivae normal.     Pupils: Pupils are equal, round, and reactive to light.  Musculoskeletal:     Right lower leg: No edema.     Left lower leg: No edema.     Comments: No evidence of effusion at bilateral knees Normal knee range of motion bilaterally Mild right medial greater than lateral joint line tenderness to palpation.  No pain with range of motion Gait without evidence of antalgia  Skin:    General: Skin is warm and dry.  Neurological:     Mental Status: She is alert and oriented to person, place, and time.  Psychiatric:        Mood and Affect: Mood normal.        Behavior: Behavior normal.        Thought Content: Thought content normal.        Judgment: Judgment normal.    Left heel no tenderness over the calcaneus there is tenderness at the distal Achilles no erythema or swelling, negative Thompson's, minimal pain with passive ankle dorsiflexion.       Assessment & Plan:  #1.  Right knee osteoarthritis  improved after Synvisc injection.  Interval history also positive for starting duloxetine which may be beneficial for chronic knee pain.  It is difficult to predict if and when the patient will need repeat Monovisc injection.  The patient will call back if she feels like her knee pain has been steadily increasing. We discussed using elliptical rather than treadmill for exercise to reduce impact #2.  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  No history of traumatic rupture.  We discussed eccentric exercises 3 sets of 15 initially on the ground and if this becomes too easy it can be performed with toes up on a step.  She can do this 5 days a week

## 2020-08-15 ENCOUNTER — Ambulatory Visit: Payer: Commercial Managed Care - PPO

## 2020-08-15 ENCOUNTER — Other Ambulatory Visit: Payer: Self-pay

## 2020-08-15 DIAGNOSIS — G4733 Obstructive sleep apnea (adult) (pediatric): Secondary | ICD-10-CM

## 2020-08-19 DIAGNOSIS — G4733 Obstructive sleep apnea (adult) (pediatric): Secondary | ICD-10-CM

## 2020-08-22 ENCOUNTER — Telehealth: Payer: Self-pay

## 2020-08-22 ENCOUNTER — Ambulatory Visit (INDEPENDENT_AMBULATORY_CARE_PROVIDER_SITE_OTHER): Payer: Commercial Managed Care - PPO | Admitting: Dermatology

## 2020-08-22 ENCOUNTER — Other Ambulatory Visit: Payer: Self-pay

## 2020-08-22 ENCOUNTER — Encounter: Payer: Self-pay | Admitting: Dermatology

## 2020-08-22 DIAGNOSIS — D485 Neoplasm of uncertain behavior of skin: Secondary | ICD-10-CM

## 2020-08-22 DIAGNOSIS — D2239 Melanocytic nevi of other parts of face: Secondary | ICD-10-CM

## 2020-08-22 DIAGNOSIS — L851 Acquired keratosis [keratoderma] palmaris et plantaris: Secondary | ICD-10-CM | POA: Diagnosis not present

## 2020-08-22 DIAGNOSIS — Z1283 Encounter for screening for malignant neoplasm of skin: Secondary | ICD-10-CM | POA: Diagnosis not present

## 2020-08-22 DIAGNOSIS — L709 Acne, unspecified: Secondary | ICD-10-CM

## 2020-08-22 MED ORDER — DAPSONE 5 % EX GEL: 1.0000 "application " | Freq: Two times a day (BID) | CUTANEOUS | 1 refills | Status: DC

## 2020-08-22 NOTE — Telephone Encounter (Signed)
Fax received from patient's Pharmacy needing prior authorization on patient's Dapsone 5%.

## 2020-08-22 NOTE — Patient Instructions (Signed)

## 2020-08-22 NOTE — Telephone Encounter (Signed)
Prior authorization done through [facility] for the patient's Dapsone 5%.    [facility] is reviewing your PA request. Typically an electronic response will be received within 24-72 hours. To check for an update later, open this request from your dashboard.  You may close this dialog and return to your dashboard to perform other tasks.

## 2020-08-23 NOTE — Telephone Encounter (Signed)
Fax received from [facility] stating that the Dapsone 5% gel is denied.

## 2020-08-24 NOTE — Telephone Encounter (Signed)
Please see if there is a promo for brand name Aczone.

## 2020-08-24 NOTE — Telephone Encounter (Signed)
I share everyone's sense of frustration over the difficulty in getting our patience affordable medications.  Let us keep in mind that the patients are frustrated as well.  Plan B would be to try cash pay [employer] mail order.

## 2020-08-29 ENCOUNTER — Other Ambulatory Visit: Payer: Self-pay | Admitting: Internal Medicine

## 2020-08-29 DIAGNOSIS — G4733 Obstructive sleep apnea (adult) (pediatric): Secondary | ICD-10-CM

## 2020-08-29 NOTE — Progress Notes (Signed)
Please call patient to initiate CPAP therapy. She stops breathing 10 times/hour in her sleep and oxygen levels drop to 79%.

## 2020-08-30 ENCOUNTER — Encounter: Payer: Self-pay | Admitting: Dermatology

## 2020-08-30 NOTE — Progress Notes (Signed)
Called and spoke with patient, advised of results/recommendations per Dr. Shearon Stalls. Advised patient that is taking months to receive the machine and to call us when she receives the machine to schedule a 31-90 day.   Patient verbalized understanding.

## 2020-08-30 NOTE — Addendum Note (Signed)
Addended by: Vanessa Barbara on: 08/30/2020 04:22 PM   Modules accepted: Orders

## 2020-08-31 NOTE — Telephone Encounter (Signed)
Cash pay per Dr Denna Haggard note

## 2020-08-31 NOTE — Progress Notes (Signed)
   New Patient   Subjective  Gina Conner is a 55 y.o. female who presents for the following: Annual Exam (Facial lesions crust that comes and goes).  Generalized skin examination plus multiple other issues she would like to discuss. Location:  Duration:  Quality:  Associated Signs/Symptoms: Modifying Factors:  Severity:  Timing: Context:    The following portions of the chart were reviewed this encounter and updated as appropriate:  Tobacco  Allergies  Meds  Problems  Med Hx  Surg Hx  Fam Hx      Objective  Well appearing patient in no apparent distress; mood and affect are within normal limits. Objective  Scalp: Full body skin check: No atypical pigmented lesions or nonmelanoma skin cancer.  Objective  Left Lower Leg - Anterior, Left Upper Arm - Anterior: Also eyebrow: Subtly raised tan textured symmetric 5 mm lesions.  Objective  Head - Anterior (Face): Adult Acne: Mostly small inflammatory lesions  Objective  Left Forehead: Pearly 6 mm lesion with historical change       A full examination was performed including scalp, head, eyes, ears, nose, lips, neck, chest, axillae, abdomen, back, buttocks, bilateral upper extremities, bilateral lower extremities, hands, feet, fingers, toes, fingernails, and toenails. All findings within normal limits unless otherwise noted below.  Areas beneath undergarments not fully examined.   Assessment & Plan  Screening exam for skin cancer Scalp  Annual skin examination, Encouraged to self examine twice annually.  Continue ultraviolet protection.  Stucco keratoses (2) Left Upper Arm - Anterior; Left Lower Leg - Anterior  Leave if stable.  Acne, unspecified acne type Head - Anterior (Face)  Unless cost is prohibitive will use 5% dapsone gel (generic accident) nightly on areas prone to acne.  Follow-up by MyChart or phone in 6 weeks.  Dapsone (ACZONE) 5 % topical gel - Head - Anterior (Face)  Neoplasm of  uncertain behavior of skin Left Forehead  Skin / nail biopsy Type of biopsy: tangential   Informed consent: discussed and consent obtained   Timeout: patient name, date of birth, surgical site, and procedure verified   Procedure prep:  Patient was prepped and draped in usual sterile fashion (Non sterile) Prep type:  Chlorhexidine Anesthesia: the lesion was anesthetized in a standard fashion   Anesthetic:  1% lidocaine w/ epinephrine 1-100,000 local infiltration Instrument used: flexible razor blade   Outcome: patient tolerated procedure well   Post-procedure details: wound care instructions given    Specimen 1 - Surgical pathology Differential Diagnosis: atypical Cautery  Check Margins: No

## 2020-09-12 ENCOUNTER — Telehealth: Payer: Self-pay | Admitting: Dermatology

## 2020-09-12 DIAGNOSIS — L709 Acne, unspecified: Secondary | ICD-10-CM

## 2020-09-12 MED ORDER — DAPSONE 7.5 % EX GEL: 1.0000 "application " | Freq: Every evening | CUTANEOUS | 1 refills | Status: DC

## 2020-09-12 NOTE — Telephone Encounter (Signed)
Phone call to patient to see if she ever got the prescription from [place]?  Per patient she never got a call from [place] and she wants to know what should she do? I informed patient I will contact [facility] to see if the have the patient's prescription.

## 2020-09-12 NOTE — Telephone Encounter (Signed)
Phone call to [facility] to see if they have the patient's prescription for the Dapsone 5% gel?  Per Pharmacist they do not have the prescription.  I asked the Pharmacist how much is the prescription for the Dapsone?  Per Pharmacist the Dapsone 5% is $80 and now the 7.5% has a manufacture coupon and the patient will pay no more than $60 and if it's covered by the patient's insurance it's a $0 copay.  I spoke with Dr. Denna Haggard about this and he gave a verbal to change the patient's prescription to the 7.5% unless the patient would like to try a different therapy of medication.

## 2020-09-12 NOTE — Telephone Encounter (Signed)
Phone call to patient with Dr. Onalee Hua recommendations regarding Dapsone prescription.  Patient aware and she's okay with trying to get the Dapsone 7.5% from her Pharmacy.  I informed patient to go to the Dapsone(Aczone) website and activate the coupon card.  Patient aware.

## 2020-09-12 NOTE — Telephone Encounter (Signed)
Patient left message on office voice mail that she is having trouble getting the prescription that was sent in for her. Patient wants to know if there is anything she needs to do on her part.

## 2020-09-16 ENCOUNTER — Encounter
Payer: Commercial Managed Care - PPO | Attending: Physical Medicine & Rehabilitation | Admitting: Physical Medicine & Rehabilitation

## 2020-09-16 ENCOUNTER — Other Ambulatory Visit: Payer: Self-pay

## 2020-09-16 ENCOUNTER — Encounter: Payer: Self-pay | Admitting: Physical Medicine & Rehabilitation

## 2020-09-16 VITALS — BP 112/62 | HR 68 | Temp 98.3°F | Ht 67.0 in | Wt 296.2 lb

## 2020-09-16 DIAGNOSIS — M170 Bilateral primary osteoarthritis of knee: Secondary | ICD-10-CM | POA: Diagnosis not present

## 2020-09-16 NOTE — Patient Instructions (Signed)
Sodium Hyaluronate intra-articular injection What is this medicine? SODIUM HYALURONATE (SOE dee um hye al yoor ON ate) is used to treat pain in the knee due to osteoarthritis. This medicine may be used for other purposes; ask your health care provider or pharmacist if you have questions. COMMON BRAND NAME(S): Amvisc, DUROLANE, Euflexxa, GELSYN-3, Hyalgan, Hymovis, Monovisc, Orthovisc, Supartz, Supartz FX, TriVisc, VISCO What should I tell my health care provider before I take this medicine? They need to know if you have any of these conditions:  bleeding disorders  glaucoma  infection in the knee joint  skin conditions or sensitivity  skin infection  an unusual allergic reaction to sodium hyaluronate, other medicines, foods, dyes, or preservatives. Different brands of sodium hyaluronate contain different allergens. Some may contain egg. Talk to your doctor about your allergies to make sure that you get the right product.  pregnant or trying to get pregnant  breast-feeding How should I use this medicine? This medicine is for injection into the knee joint. It is given by a health care professional in a hospital or clinic setting. Talk to your pediatrician regarding the use of this medicine in children. Special care may be needed. Overdosage: If you think you have taken too much of this medicine contact a poison control center or emergency room at once. NOTE: This medicine is only for you. Do not share this medicine with others. What if I miss a dose? This does not apply. What may interact with this medicine? Interactions are not expected. This list may not describe all possible interactions. Give your health care provider a list of all the medicines, herbs, non-prescription drugs, or dietary supplements you use. Also tell them if you smoke, drink alcohol, or use illegal drugs. Some items may interact with your medicine. What should I watch for while using this medicine? Tell your  doctor or healthcare professional if your symptoms do not start to get better or if they get worse. If receiving this medicine for osteoarthritis, limit your activity after you receive your injection. Avoid physical activity for 48 hours following your injection to keep your knee from swelling. Do not stand on your feet for more than 1 hour at a time during the first 48 hours following your injection. Ask your doctor or healthcare professional about when you can begin major physical activity again. What side effects may I notice from receiving this medicine? Side effects that you should report to your doctor or health care professional as soon as possible:  allergic reactions like skin rash, itching or hives, swelling of the face, lips, or tongue  dizziness  facial flushing  pain, tingling, numbness in the hands or feet  vision changes if received this medicine during eye surgery Side effects that usually do not require medical attention (report to your doctor or health care professional if they continue or are bothersome):  back pain  bruising at site where injected  chills  diarrhea  fever  headache  joint pain  joint stiffness  joint swelling  muscle cramps  muscle pain  nausea, vomiting  pain, redness, or irritation at site where injected  weak or tired This list may not describe all possible side effects. Call your doctor for medical advice about side effects. You may report side effects to FDA at 1-800-FDA-1088. Where should I keep my medicine? This drug is given in a hospital or clinic and will not be stored at home. NOTE: This sheet is a summary. It may not cover all   possible information. If you have questions about this medicine, talk to your doctor, pharmacist, or health care provider.  2021 Elsevier/Gold Standard (2015-06-02 08:34:51)  

## 2020-09-16 NOTE — Progress Notes (Signed)
Indication end-stage osteoarthritis of theRight  knee with pain that limits mobility and does not respond to oral medications.  Ultrasound guidance, 12 Hz linear transducer, long axis view  Medial aspect of the knee was imaged, identified joint space, identified patella, femur, tibia. 25-gauge 1.5 inch needle was inserted under ultrasound guidance and 3 mL of 1% lidocaine were infiltrated into the skin and subcutaneous tissue. Then a 21-gauge, 2 inch needle was inserted along the same needle track Into the joint under direct ultrasound visualization. 3 cc of joint fluid were removed and discarded. 6 mL of monovisc-1 were injected. Patient tolerated procedure well Post procedure instructions given

## 2020-09-22 ENCOUNTER — Ambulatory Visit: Payer: Commercial Managed Care - PPO | Admitting: Family Medicine

## 2020-09-30 ENCOUNTER — Ambulatory Visit: Payer: Commercial Managed Care - PPO

## 2020-09-30 ENCOUNTER — Other Ambulatory Visit: Payer: Self-pay

## 2020-09-30 ENCOUNTER — Ambulatory Visit (INDEPENDENT_AMBULATORY_CARE_PROVIDER_SITE_OTHER): Payer: Commercial Managed Care - PPO | Admitting: Nurse Practitioner

## 2020-09-30 DIAGNOSIS — Z8616 Personal history of COVID-19: Secondary | ICD-10-CM | POA: Diagnosis not present

## 2020-09-30 NOTE — Patient Instructions (Signed)
History of COVID-19 Memory loss Body aches:   Glad you are improving  Keep follow up with PMR  Stay active  Stay well hydrated  Recommend Gluten-free diet  Continue to follow with pulmonary   Anxiety:  Continue Cymbalta for anxiety and pain   Follow up:  Follow up in 3 months or sooner if needed

## 2020-09-30 NOTE — Progress Notes (Signed)
@Patient  ID: Gina Conner, female    DOB: 03-10-1966, 55 y.o.   MRN: [id number]  Chief Complaint  Patient presents with  . Follow-up    Referring provider: Manon Hilding, MD  HPI  Patient presents today for post-COVID care clinic visit follow-up.  Patient states patient states that overall since her last visit here on 07/04/2020 she has been doing well.  She has followed up with pulmonary and physical med and rehab.  Overall she states that symptoms are improving.  She is trying to get set up with a new PCP.  Unfortunately the primary care office that she is try to get set up with that it would be January with further uterine appointment.  We will try to get her in with Dr. Joya Gaskins at [facility]. Denies f/c/s, n/v/d, hemoptysis, PND, chest pain or edema.    No Known Allergies  Immunization History  Administered Date(s) Administered  . Influenza,inj,Quad PF,6+ Mos 02/15/2020  . Tdap 02/18/2020    Past Medical History:  Diagnosis Date  . DVT (deep venous thrombosis) ([place])    provoked during tendon surgery was treated 3 months of coumadin  . Hypothyroidism   . Migraines    30 years ago     Tobacco History: Social History   Tobacco Use  Smoking Status Never Smoker  Smokeless Tobacco Never Used   Counseling given: Yes   Outpatient Encounter Medications as of 09/30/2020  Medication Sig  . albuterol (VENTOLIN HFA) 108 (90 Base) MCG/ACT inhaler Inhale 2 puffs into the lungs every 6 (six) hours as needed for wheezing or shortness of breath.  . ALPRAZolam (XANAX) 0.5 MG tablet Take 0.5 mg by mouth at bedtime as needed for sleep.  . Cholecalciferol 125 MCG (5000 UT) capsule Take 5,000 Units by mouth daily.  . Dapsone (ACZONE) 7.5 % GEL Apply 1 application topically at bedtime.  . DULoxetine (CYMBALTA) 30 MG capsule Take 1 capsule (30 mg total) by mouth daily.  Marland Kitchen ipratropium-albuterol (DUONEB) 0.5-2.5 (3) MG/3ML SOLN Take 3 mLs by nebulization every 6 (six)  hours as needed (shortness of breath).  Marland Kitchen levothyroxine (SYNTHROID) 88 MCG tablet Take 88 mcg by mouth daily before breakfast. Pt unsure of strength.  . Multiple Vitamin (MULTIVITAMIN) tablet Take 1 tablet by mouth daily.  . progesterone (PROMETRIUM) 100 MG capsule Take 100 mg by mouth at bedtime.  Marland Kitchen spironolactone (ALDACTONE) 25 MG tablet Take 25 mg by mouth at bedtime.  . Zinc Acetate, Oral, (ZINC ACETATE PO) Take by mouth.   No facility-administered encounter medications on file as of 09/30/2020.     Review of Systems  Review of Systems  Constitutional: Negative.   HENT: Negative.   Respiratory: Negative for cough and shortness of breath.   Cardiovascular: Negative.   Gastrointestinal: Negative.   Allergic/Immunologic: Negative.   Neurological: Negative.   Psychiatric/Behavioral: Negative.        Physical Exam  BP 130/69   Pulse 72   Temp 97.9 F (36.6 C)   Resp 18   SpO2 97%   Wt Readings from Last 5 Encounters:  09/16/20 296 lb 3.2 oz (134.4 kg)  07/19/20 291 lb (132 kg)  07/18/20 289 lb (131.1 kg)  04/12/20 293 lb (132.9 kg)  03/29/20 299 lb 0.1 oz (135.6 kg)     Physical Exam Vitals and nursing note reviewed.  Constitutional:      General: She is not in acute distress.    Appearance: She is well-developed.  Cardiovascular:  Rate and Rhythm: Normal rate and regular rhythm.  Pulmonary:     Effort: Pulmonary effort is normal.     Breath sounds: Normal breath sounds.  Musculoskeletal:     Right lower leg: No edema.     Left lower leg: No edema.  Neurological:     Mental Status: She is alert and oriented to person, place, and time.  Psychiatric:        Mood and Affect: Mood normal.        Behavior: Behavior normal.        Assessment & Plan:   History of COVID-19 Memory loss Body aches:   Glad you are improving  Keep follow up with PMR  Stay active  Stay well hydrated  Recommend Gluten-free diet  Continue to follow with  pulmonary   Anxiety:  Continue Cymbalta for anxiety and pain   Follow up:  Follow up in 3 months or sooner if needed      Fenton Foy, NP 10/11/2020

## 2020-10-11 NOTE — Assessment & Plan Note (Signed)
Memory loss Body aches:   Glad you are improving  Keep follow up with PMR  Stay active  Stay well hydrated  Recommend Gluten-free diet  Continue to follow with pulmonary   Anxiety:  Continue Cymbalta for anxiety and pain   Follow up:  Follow up in 3 months or sooner if needed

## 2020-10-20 ENCOUNTER — Other Ambulatory Visit: Payer: Self-pay | Admitting: Nurse Practitioner

## 2020-10-20 MED ORDER — DULOXETINE HCL 30 MG PO CPEP
30.0000 mg | ORAL_CAPSULE | Freq: Every day | ORAL | 0 refills | Status: DC
Start: 2020-10-20 — End: 2020-11-21

## 2020-11-16 ENCOUNTER — Other Ambulatory Visit: Payer: Self-pay | Admitting: Nurse Practitioner

## 2020-12-20 ENCOUNTER — Other Ambulatory Visit: Payer: Self-pay | Admitting: Nurse Practitioner

## 2020-12-20 ENCOUNTER — Encounter: Payer: Commercial Managed Care - PPO | Admitting: Physical Medicine & Rehabilitation

## 2020-12-26 ENCOUNTER — Other Ambulatory Visit (INDEPENDENT_AMBULATORY_CARE_PROVIDER_SITE_OTHER): Payer: Self-pay | Admitting: Nurse Practitioner

## 2020-12-26 MED ORDER — DULOXETINE HCL 30 MG PO CPEP
30.0000 mg | ORAL_CAPSULE | Freq: Every day | ORAL | 0 refills | Status: DC
Start: 2020-12-26 — End: 2021-08-09

## 2021-01-04 ENCOUNTER — Ambulatory Visit: Payer: Commercial Managed Care - PPO

## 2021-01-06 ENCOUNTER — Ambulatory Visit: Payer: Commercial Managed Care - PPO

## 2021-01-10 ENCOUNTER — Telehealth: Payer: Commercial Managed Care - PPO

## 2021-01-10 ENCOUNTER — Telehealth (INDEPENDENT_AMBULATORY_CARE_PROVIDER_SITE_OTHER): Payer: Commercial Managed Care - PPO | Admitting: Nurse Practitioner

## 2021-01-10 ENCOUNTER — Other Ambulatory Visit: Payer: Self-pay

## 2021-01-10 ENCOUNTER — Encounter: Payer: Self-pay | Admitting: Nurse Practitioner

## 2021-01-10 DIAGNOSIS — Z8616 Personal history of COVID-19: Secondary | ICD-10-CM

## 2021-01-10 NOTE — Progress Notes (Signed)
Virtual Visit via Telephone Note  I connected with Gina Conner on 01/10/21 at 10:10 AM EDT by telephone and verified that I am speaking with the correct person using two identifiers.  Location: Patient: home Provider: office   I discussed the limitations, risks, security and privacy concerns of performing an evaluation and management service by telephone and the availability of in person appointments. I also discussed with the patient that there may be a patient responsible charge related to this service. The patient expressed understanding and agreed to proceed.   History of Present Illness:  Patient presents today for post-COVID care clinic visit follow-up.  Patient states that since seen in our office on 09/30/2020 she has been doing well.  Patient is currently on Cymbalta and states that this is helping with anxiety depression and overall pain as well.  She has followed with pulmonary and physical med and rehab.  Patient has an appointment to establish care with a new PCP this month. Denies f/c/s, n/v/d, hemoptysis, PND, chest pain or edema.     Observations/Objective:  Vitals with BMI 10/11/2020 09/16/2020 07/19/2020  Height - '5\' 7"'$  '5\' 7"'$   Weight - 296 lbs 3 oz 291 lbs  BMI - 123456 [id number]  Systolic [id number]  Diastolic 69 62 84  Pulse 72 68 67      Assessment and Plan:  History of COVID-19 Memory loss Body aches:     Glad you are improving   Keep follow up with PMR   Stay active    Stay well hydrated    Recommend Gluten-free diet   Continue to follow with pulmonary     Anxiety:   Continue Cymbalta for anxiety and pain     Follow up:   Follow up if needed     I discussed the assessment and treatment plan with the patient. The patient was provided an opportunity to ask questions and all were answered. The patient agreed with the plan and demonstrated an understanding of the instructions.   The patient was advised to call back or seek an in-person  evaluation if the symptoms worsen or if the condition fails to improve as anticipated.  I provided 25 minutes of non-face-to-face time during this encounter.   Fenton Foy, NP

## 2021-01-10 NOTE — Patient Instructions (Signed)
History of COVID-19 Memory loss Body aches:     Glad you are improving   Keep follow up with PMR   Stay active    Stay well hydrated    Recommend Gluten-free diet   Continue to follow with pulmonary     Anxiety:   Continue Cymbalta for anxiety and pain     Follow up:   Follow up if needed

## 2021-01-25 ENCOUNTER — Other Ambulatory Visit (INDEPENDENT_AMBULATORY_CARE_PROVIDER_SITE_OTHER): Payer: Self-pay | Admitting: Nurse Practitioner

## 2021-02-02 ENCOUNTER — Encounter: Payer: Commercial Managed Care - PPO | Admitting: Physical Medicine & Rehabilitation

## 2021-03-03 ENCOUNTER — Encounter: Payer: Self-pay | Admitting: Physical Medicine & Rehabilitation

## 2021-03-03 ENCOUNTER — Other Ambulatory Visit: Payer: Self-pay

## 2021-03-03 ENCOUNTER — Encounter
Payer: Commercial Managed Care - PPO | Attending: Physical Medicine & Rehabilitation | Admitting: Physical Medicine & Rehabilitation

## 2021-03-03 VITALS — BP 137/84 | HR 71 | Ht 67.0 in | Wt 287.0 lb

## 2021-03-03 DIAGNOSIS — M1711 Unilateral primary osteoarthritis, right knee: Secondary | ICD-10-CM | POA: Diagnosis not present

## 2021-03-03 NOTE — Progress Notes (Signed)
Subjective:    Patient ID: Gina Conner, female    DOB: 1965/12/01, 55 y.o.   MRN: [id number]  HPI 55 year old female with history of right greater than left knee osteoarthritis and chronic right knee pain.  Her pain is at a 6-7 out of 10.  It is worse with walking sitting and standing.  She tries not to let it interfere with her activities although it does hurt when she is walking longer distances.  She does not wish to take pain medications on a chronic basis. Right knee Synvisc 09/16/2020  Pain Inventory Average Pain 7 Pain Right Now 6 My pain is intermittent, constant, sharp, burning, and aching  In the last 24 hours, has pain interfered with the following? General activity 0 Relation with others 0 Enjoyment of life 0 What TIME of day is your pain at its worst? daytime, evening, and night Sleep (in general) Fair  Pain is worse with: walking, sitting, and standing Pain improves with: injections Relief from Meds: 0  Family History  Problem Relation Age of Onset   Pulmonary embolism Mother    Migraines Mother    Social History   Socioeconomic History   Marital status: Married    Spouse name: Not on file   Number of children: Not on file   Years of education: Not on file   Highest education level: Not on file  Occupational History   Not on file  Tobacco Use   Smoking status: Never   Smokeless tobacco: Never  Vaping Use   Vaping Use: Never used  Substance and Sexual Activity   Alcohol use: No    Comment: occ   Drug use: No   Sexual activity: Yes  Other Topics Concern   Not on file  Social History Narrative   Not on file   Social Determinants of Health   Financial Resource Strain: Not on file  Food Insecurity: Not on file  Transportation Needs: Not on file  Physical Activity: Not on file  Stress: Not on file  Social Connections: Not on file   Past Surgical History:  Procedure Laterality Date   ABDOMINAL HYSTERECTOMY     ACHILLES TENDON REPAIR      EXCISION/RELEASE BURSA HIP Right 02/06/2017   Procedure: Right hip bursectomy;  Surgeon: Gaynelle Arabian, MD;  Location: [facility];  Service: Orthopedics;  Laterality: Right;   ROUX-EN-Y GASTRIC BYPASS  2002   Past Surgical History:  Procedure Laterality Date   ABDOMINAL HYSTERECTOMY     ACHILLES TENDON REPAIR     EXCISION/RELEASE BURSA HIP Right 02/06/2017   Procedure: Right hip bursectomy;  Surgeon: Gaynelle Arabian, MD;  Location: [facility];  Service: Orthopedics;  Laterality: Right;   ROUX-EN-Y GASTRIC BYPASS  2002   Past Medical History:  Diagnosis Date   DVT (deep venous thrombosis) ([facility])    provoked during tendon surgery was treated 3 months of coumadin   Hypothyroidism    Migraines    30 years ago    BP 137/84   Pulse 71   Ht 5\' 7"  (1.702 m)   Wt 287 lb (130.2 kg)   SpO2 98%   BMI 44.95 kg/m   Opioid Risk Score:   Fall Risk Score:  `1  Depression screen PHQ 2/9  Depression screen [facility] 2/9 09/16/2020 03/18/2020 02/05/2020 01/28/2020 09/23/2019  Decreased Interest 0 0 1 0 0  Down, Depressed, Hopeless 0 0 0 0 1  PHQ - 2 Score 0 0 1 0 1  Altered sleeping - -  2 - 2  Tired, decreased energy - - 3 - 3  Change in appetite - - 2 - 1  Feeling bad or failure about yourself  - - 0 - 1  Trouble concentrating - - 0 - 1  Moving slowly or fidgety/restless - - 0 - 0  Suicidal thoughts - - 0 - 0  PHQ-9 Score - - 8 - 9  Difficult doing work/chores - - Not difficult at all - Very difficult    Review of Systems  Musculoskeletal:  Positive for gait problem.       Back and front of knee pain  All other systems reviewed and are negative.     Objective:   Physical Exam Vitals and nursing note reviewed.  Constitutional:      Appearance: She is obese.  HENT:     Head: Normocephalic and atraumatic.  Eyes:     Extraocular Movements: Extraocular movements intact.     Conjunctiva/sclera: Conjunctivae normal.     Pupils: Pupils are equal, round, and reactive to light.  Musculoskeletal:      Comments: Right knee without evidence of effusion, there is no evidence of erythema.  No pain with range of motion.  No pain with palpation of the patellar region there is tenderness along the medial and lateral joint lines right greater than left side. She ambulates without assist device no evidence of antalgic gait  Neurological:     Mental Status: She is alert and oriented to person, place, and time. Mental status is at baseline.     Comments: Motor strength is 5/5 in the right hip flexor knee extensor ankle dorsiflexor Negative straight leg raising right lower extremity Sensation normal in the right lower extremity.  Psychiatric:        Mood and Affect: Mood normal.        Behavior: Behavior normal.          Assessment & Plan:   #1.  Right knee osteoarthritis she had about 4-1/2 months relief following Synvisc injection.  I do think she would benefit from repeat injection of Synvisc 1 next month.  She is approximately 2 months post injection at this time  We also discussed that her Voltaren gel should be applied 4 times daily rather than twice daily.  This will likely give her some additional benefit.

## 2021-03-03 NOTE — Patient Instructions (Signed)
Voltaren gel 4 x per day   Repeat Right knee synvisc next month

## 2021-04-25 ENCOUNTER — Encounter: Payer: Commercial Managed Care - PPO | Admitting: Physical Medicine & Rehabilitation

## 2021-06-09 ENCOUNTER — Ambulatory Visit: Payer: Commercial Managed Care - PPO | Admitting: Physical Medicine & Rehabilitation

## 2021-07-04 ENCOUNTER — Encounter: Payer: Commercial Managed Care - PPO | Admitting: Physical Medicine & Rehabilitation

## 2021-08-10 ENCOUNTER — Encounter
Payer: Commercial Managed Care - PPO | Attending: Physical Medicine & Rehabilitation | Admitting: Physical Medicine & Rehabilitation

## 2021-08-10 ENCOUNTER — Encounter: Payer: Self-pay | Admitting: Physical Medicine & Rehabilitation

## 2021-08-10 VITALS — BP 140/75 | HR 76 | Ht 67.0 in | Wt 285.0 lb

## 2021-08-10 DIAGNOSIS — M25561 Pain in right knee: Secondary | ICD-10-CM | POA: Diagnosis present

## 2021-08-10 DIAGNOSIS — R262 Difficulty in walking, not elsewhere classified: Secondary | ICD-10-CM | POA: Insufficient documentation

## 2021-08-10 DIAGNOSIS — M1711 Unilateral primary osteoarthritis, right knee: Secondary | ICD-10-CM | POA: Diagnosis not present

## 2021-08-10 NOTE — Progress Notes (Signed)
Indication end-stage osteoarthritis of theRight  knee with pain that limits mobility and does not respond to oral medications.  Ultrasound guidance, 12 Hz linear transducer, long axis view  Medial aspect of the knee was imaged, identified joint space, identified patella, femur, tibia. 25-gauge 1.5 inch needle was inserted under ultrasound guidance and 3 mL of 1% lidocaine were infiltrated into the skin and subcutaneous tissue. Then a 21-gauge, 2 inch needle was inserted along the same needle track Into the joint under direct ultrasound visualization. 3 cc of joint fluid were removed and discarded. 6 mL of Synvisc-1 were injected. Patient tolerated procedure well Post procedure instructions given  

## 2021-09-28 IMAGING — DX DG HIP (WITH OR WITHOUT PELVIS) 5+V BILAT
5 series · 5 of 5 positions shown · non-contrast
Comparison: None.

CLINICAL DATA: Chronic pain

EXAM:
DG HIP (WITH OR WITHOUT PELVIS) 4+V BILAT

[pelvis ap]
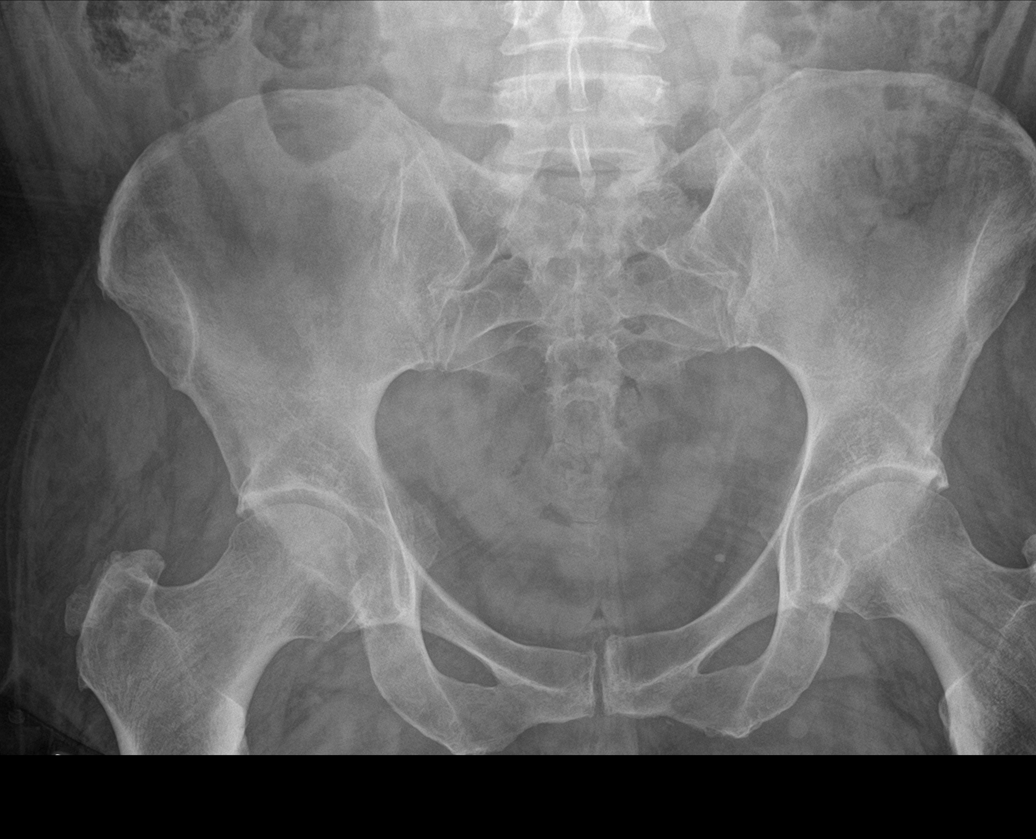

[hip ap (1 of 2)]
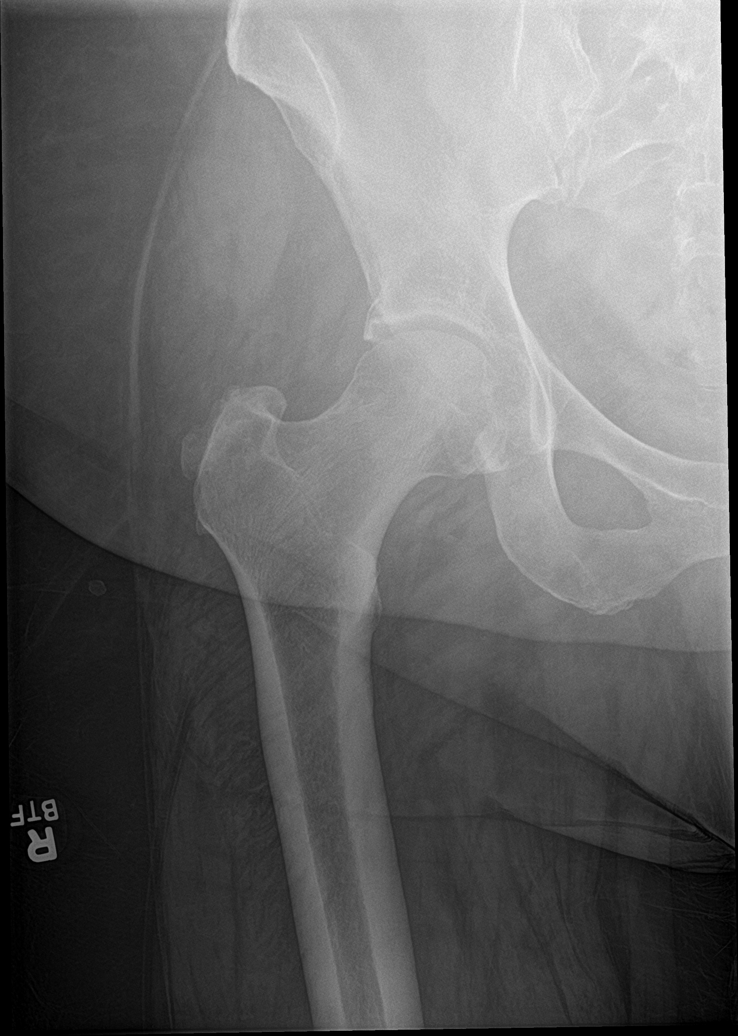

[hip lat (1 of 2)]
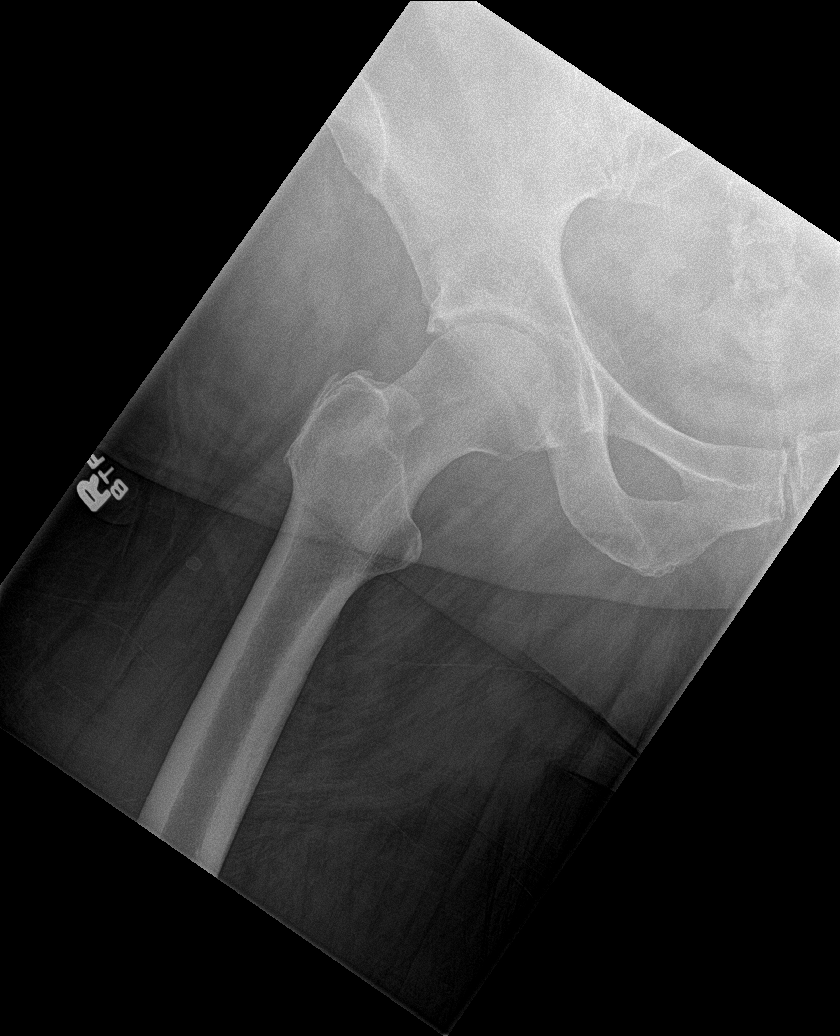

[hip ap (2 of 2)]
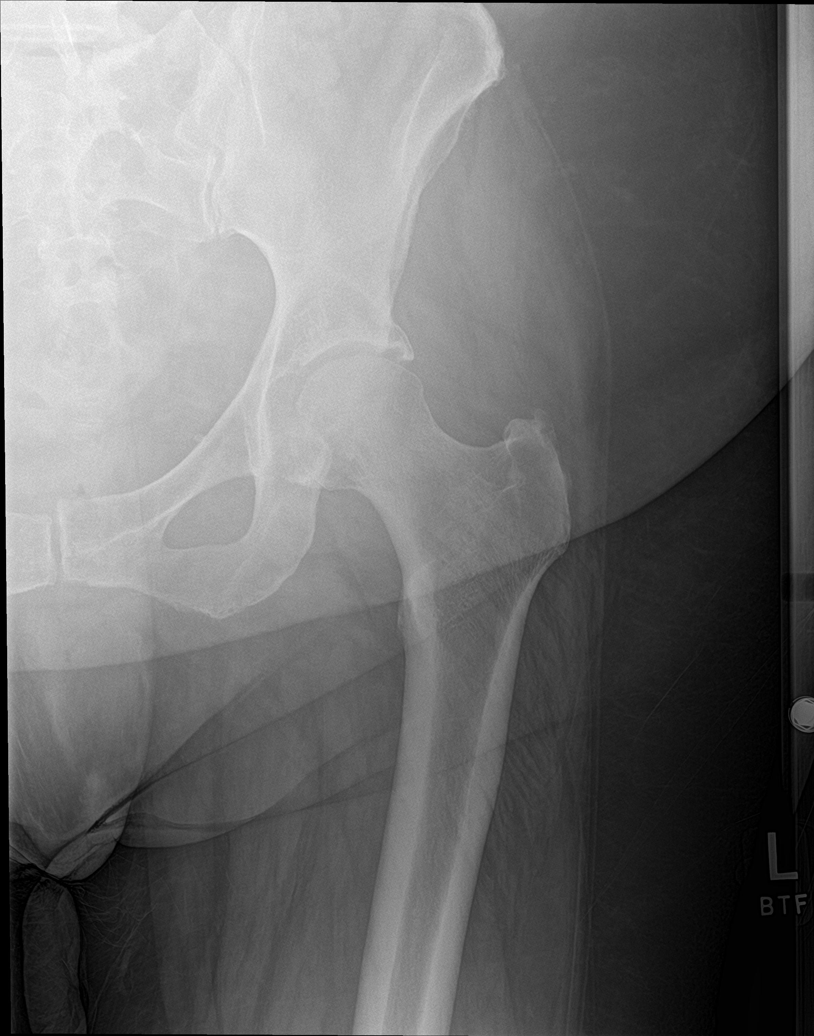

[hip lat (2 of 2)]
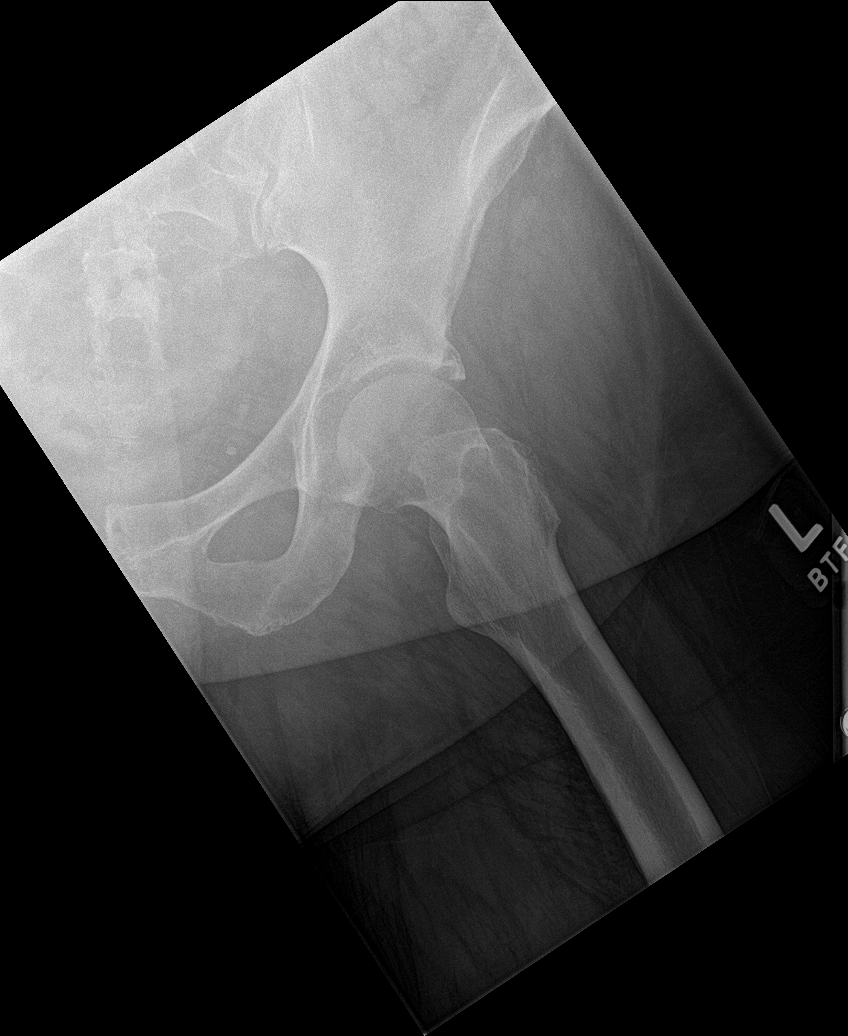

[5 of 5 positions shown; findings below may reference images not displayed]

FINDINGS: Frontal pelvis as well as frontal and lateral views of each hip
joint obtained-total five views. There is no fracture or
dislocation. There is slight symmetric narrowing of each hip joint.
There is bony overgrowth along each superolateral acetabulum. No
erosive change. Sacroiliac joints appear normal bilaterally.
IMPRESSION: Slight symmetric narrowing of each hip joint. Bony overgrowth along
each superolateral acetabulum potentially places patient at
increased risk for femoroacetabular impingement. No fracture or
dislocation.

## 2022-02-09 ENCOUNTER — Other Ambulatory Visit (HOSPITAL_BASED_OUTPATIENT_CLINIC_OR_DEPARTMENT_OTHER): Payer: Self-pay

## 2022-02-09 MED ORDER — FLUARIX QUADRIVALENT 0.5 ML IM SUSY
PREFILLED_SYRINGE | INTRAMUSCULAR | 0 refills | Status: DC
  Filled 2022-02-09: qty 0.5, 1d supply, fill #0

## 2022-02-13 ENCOUNTER — Encounter
Payer: Commercial Managed Care - PPO | Attending: Physical Medicine & Rehabilitation | Admitting: Physical Medicine & Rehabilitation

## 2022-02-13 ENCOUNTER — Encounter: Payer: Self-pay | Admitting: Physical Medicine & Rehabilitation

## 2022-02-13 VITALS — BP 133/84 | HR 67 | Ht 67.0 in | Wt 261.4 lb

## 2022-02-13 DIAGNOSIS — M1711 Unilateral primary osteoarthritis, right knee: Secondary | ICD-10-CM | POA: Diagnosis not present

## 2022-02-13 DIAGNOSIS — M25552 Pain in left hip: Secondary | ICD-10-CM | POA: Diagnosis not present

## 2022-02-13 DIAGNOSIS — M25561 Pain in right knee: Secondary | ICD-10-CM | POA: Diagnosis not present

## 2022-02-13 DIAGNOSIS — M25551 Pain in right hip: Secondary | ICD-10-CM | POA: Diagnosis not present

## 2022-02-13 DIAGNOSIS — G8929 Other chronic pain: Secondary | ICD-10-CM | POA: Diagnosis not present

## 2022-02-13 DIAGNOSIS — M25562 Pain in left knee: Secondary | ICD-10-CM | POA: Diagnosis not present

## 2022-02-13 MED ORDER — HYLAN G-F 20 16 MG/2ML IX SOSY
16.0000 mg | PREFILLED_SYRINGE | Freq: Once | INTRA_ARTICULAR | Status: AC
  Administered 2022-02-13: 16 mg via INTRA_ARTICULAR

## 2022-02-13 NOTE — Progress Notes (Signed)
Indication end-stage osteoarthritis of theRight  knee with pain that limits mobility and does not respond to oral medications.  Ultrasound guidance, 12 Hz linear transducer, long axis view  Medial aspect of the knee was imaged, identified joint space, identified patella, femur, tibia. 25-gauge 1.5 inch needle was inserted under ultrasound guidance and 3 mL of 1% lidocaine were infiltrated into the skin and subcutaneous tissue. Then a 21-gauge, 2 inch needle was inserted along the same needle track Into the joint under direct ultrasound visualization. 3 cc of joint fluid were removed and discarded. 6 mL of Synvisc-1 were injected. Patient tolerated procedure well Post procedure instructions given

## 2022-02-16 ENCOUNTER — Ambulatory Visit (HOSPITAL_COMMUNITY)
Admission: RE | Admit: 2022-02-16 | Discharge: 2022-02-16 | Disposition: A | Discharge status: Home / Self Care | Payer: Commercial Managed Care - PPO | Source: Ambulatory Visit | Attending: Physical Medicine & Rehabilitation | Admitting: Physical Medicine & Rehabilitation

## 2022-02-16 ENCOUNTER — Other Ambulatory Visit: Payer: Self-pay | Admitting: Physical Medicine & Rehabilitation

## 2022-02-16 DIAGNOSIS — M25562 Pain in left knee: Secondary | ICD-10-CM | POA: Insufficient documentation

## 2022-02-16 DIAGNOSIS — M25551 Pain in right hip: Secondary | ICD-10-CM | POA: Diagnosis present

## 2022-02-16 DIAGNOSIS — M25561 Pain in right knee: Secondary | ICD-10-CM | POA: Diagnosis present

## 2022-02-16 DIAGNOSIS — M25552 Pain in left hip: Secondary | ICD-10-CM

## 2022-02-16 DIAGNOSIS — G8929 Other chronic pain: Secondary | ICD-10-CM | POA: Diagnosis present

## 2022-03-27 ENCOUNTER — Encounter: Payer: Self-pay | Admitting: Physical Medicine & Rehabilitation

## 2022-03-27 ENCOUNTER — Encounter
Payer: Commercial Managed Care - PPO | Attending: Physical Medicine & Rehabilitation | Admitting: Physical Medicine & Rehabilitation

## 2022-03-27 VITALS — BP 132/82 | HR 60 | Ht 67.0 in | Wt 257.0 lb

## 2022-03-27 DIAGNOSIS — M1711 Unilateral primary osteoarthritis, right knee: Secondary | ICD-10-CM | POA: Insufficient documentation

## 2022-03-27 DIAGNOSIS — M25551 Pain in right hip: Secondary | ICD-10-CM | POA: Diagnosis not present

## 2022-03-27 DIAGNOSIS — M25552 Pain in left hip: Secondary | ICD-10-CM | POA: Diagnosis not present

## 2022-03-27 NOTE — Patient Instructions (Addendum)
Xrays of Hips and knee showing mild arthritis, may repeat gel injection in 5 months if needed`

## 2022-03-27 NOTE — Progress Notes (Signed)
Established Patient Office Visit  Subjective   Patient ID: Gina Conner, female    DOB: [birth year]  Age: 56 y.o. 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 04/12/2020   Ulnar neuropathy at elbow of right upper extremity 04/12/2020   Problems influencing health status 02/18/2020   Disorder of skeletal system 02/18/2020   History of COVID-19 01/28/2020   Myalgia 01/28/2020   Memory loss 01/28/2020   Numbness 01/28/2020   Nonintractable headache 01/28/2020   Body aches 01/28/2020   Pneumonia due to COVID-19 virus 10/26/2019   Chronic respiratory failure with hypoxia ([place]) 10/26/2019   Dyspnea 10/26/2019   Acute hypoxemic respiratory failure due to COVID-19 [facility]) 05/19/2019   Hypothyroidism 05/19/2019   Acute respiratory failure due to COVID-19 [facility]) 05/19/2019   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  Musculoskeletal:  Negative for falls and myalgias.  Skin:  Negative for itching and rash.  Neurological:  Negative for focal weakness.      Objective:     BP 132/82   Pulse 60   Ht '5\' 7"'$  (1.702 m)   Wt 257 lb (116.6 kg)   SpO2 98%   BMI 40.25 kg/m     Physical Exam Vitals reviewed.  Constitutional:      Appearance: She is obese.  HENT:     Head: Normocephalic and atraumatic.  Eyes:     Extraocular Movements: Extraocular movements intact.      Conjunctiva/sclera: Conjunctivae normal.     Pupils: Pupils are equal, round, and reactive to light.  Musculoskeletal:     Comments: Right knee no evidence of effusion no pain with range of motion Left knee no evidence of effusion no pain with range of motion Right hip limited internal rotation normal external rotation no pain Left hip limited internal rotation normal external rotation no pain with range of motion Ambulates independently without antalgia  Neurological:     Mental Status: She is alert.  Psychiatric:        Mood and Affect: Mood normal.        Behavior: Behavior normal.      No results found for any visits on 03/27/22.     The 10-year ASCVD risk score (Arnett DK, et al., 2019) is: 2.2%    Assessment & Plan:   Problem List Items Addressed This Visit   1.  Right knee pain improved after hyaluronic acid injection performed approximately 6 weeks ago We discussed usual duration of around 6 months.  Would like to see her back at the 71-monthmark to see if effects are wearing off and whether repeat injection will be needed Repeated bilateral knee x-rays showed no evidence of progression of osteoarthritis, still at the mild stage 2.  Bilateral hip  pain reduced range of motion, evidence of mild OA but no evidence of traumatic injury.  No interventions needed.  Return in about 4 months (around 07/26/2022) for MD.    Charlett Blake, MD

## 2022-04-04 ENCOUNTER — Other Ambulatory Visit: Payer: Self-pay | Admitting: Obstetrics and Gynecology

## 2022-04-04 DIAGNOSIS — R928 Other abnormal and inconclusive findings on diagnostic imaging of breast: Secondary | ICD-10-CM

## 2022-04-12 ENCOUNTER — Ambulatory Visit
Admission: RE | Admit: 2022-04-12 | Discharge: 2022-04-12 | Disposition: A | Discharge status: Home / Self Care | Payer: Commercial Managed Care - PPO | Source: Ambulatory Visit | Attending: Obstetrics and Gynecology | Admitting: Obstetrics and Gynecology

## 2022-04-12 ENCOUNTER — Ambulatory Visit: Admission: RE | Admit: 2022-04-12 | Payer: Commercial Managed Care - PPO | Source: Ambulatory Visit

## 2022-04-12 DIAGNOSIS — R928 Other abnormal and inconclusive findings on diagnostic imaging of breast: Secondary | ICD-10-CM

## 2022-06-18 ENCOUNTER — Other Ambulatory Visit: Payer: Self-pay | Admitting: *Deleted

## 2022-06-18 DIAGNOSIS — M79604 Pain in right leg: Secondary | ICD-10-CM

## 2022-07-01 NOTE — Progress Notes (Unsigned)
Office Note     CC: Symptomatic right leg varicose veins Requesting Provider:  Lujean Amel, MD  HPI: Gina Conner is a 57 y.o. (1966-03-04) female who presents at the request of Sasser, Silvestre Moment, MD for evaluation of symptomatic right leg varicose veins.  On exam today, Burman Nieves was doing well.  Originally from [place], she has lived there her entire life.  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  In retirement, she is is enjoying time with her 105 year old son who is involved in high school wrestling, and has been working hard to lose weight - she has lost 50 lbs.  Maggie notes right lower extremity heaviness and pain by days and, with tenderness at the thigh reconstitutes.  She denies bleeding, ulceration.  She tries to elevate when able, but has not used compression stockings. No history of DVT.  No history of venous procedures.   Past Medical History:  Diagnosis Date   DVT (deep venous thrombosis) (HCC)    provoked during tendon surgery was treated 3 months of coumadin   Hypothyroidism    Migraines    30 years ago     Past Surgical History:  Procedure Laterality Date   ABDOMINAL HYSTERECTOMY     ACHILLES TENDON REPAIR     EXCISION/RELEASE BURSA HIP Right 02/06/2017   Procedure: Right hip bursectomy;  Surgeon: Gaynelle Arabian, MD;  Location: [facility];  Service: Orthopedics;  Laterality: Right;   ROUX-EN-Y GASTRIC BYPASS  2002    Social History   Socioeconomic History   Marital status: Married    Spouse name: Not on file   Number of children: Not on file   Years of education: Not on file   Highest education level: Not on file  Occupational History   Not on file  Tobacco Use   Smoking status: Never   Smokeless tobacco: Never  Vaping Use   Vaping Use: Never used  Substance and Sexual Activity   Alcohol use: No    Comment: occ   Drug use: No   Sexual activity: Yes  Other Topics Concern    Not on file  Social History Narrative   Not on file   Social Determinants of Health   Financial Resource Strain: Not on file  Food Insecurity: Not on file  Transportation Needs: Not on file  Physical Activity: Not on file  Stress: Not on file  Social Connections: Not on file  Intimate Partner Violence: Not on file   Family History  Problem Relation Age of Onset   Pulmonary embolism Mother    Migraines Mother     Current Outpatient Medications  Medication Sig Dispense Refill   albuterol (VENTOLIN HFA) 108 (90 Base) MCG/ACT inhaler 1 puff as needed     ALPRAZolam (XANAX) 0.5 MG tablet Take 0.5 mg by mouth at bedtime as needed for sleep.     DULoxetine (CYMBALTA) 30 MG capsule 1 capsule     ergocalciferol (VITAMIN D2) 1.25 MG (50000 UT) capsule Vitamin D2 1,250 mcg (50,000 unit) capsule  TAKE ONE CAPSULE BY MOUTH EVERY 7 DAYS AS DIRECTED     ipratropium-albuterol (DUONEB) 0.5-2.5 (3) MG/3ML SOLN Take 3 mLs by nebulization every 6 (six) hours as needed (shortness of breath). 360 mL 3   Multiple Vitamins-Minerals (MULTI FOR HER) TABS 1 capsule     No current facility-administered medications for this visit.    No Known Allergies   REVIEW OF SYSTEMS:  [  X] denotes positive finding, [ ]$  denotes negative finding Cardiac  Comments:  Chest pain or chest pressure:    Shortness of breath upon exertion:    Short of breath when lying flat:    Irregular heart rhythm:        Vascular    Pain in calf, thigh, or hip brought on by ambulation:    Pain in feet at night that wakes you up from your sleep:     Blood clot in your veins:    Leg swelling:         Pulmonary    Oxygen at home:    Productive cough:     Wheezing:         Neurologic    Sudden weakness in arms or legs:     Sudden numbness in arms or legs:     Sudden onset of difficulty speaking or slurred speech:    Temporary loss of vision in one eye:     Problems with dizziness:         Gastrointestinal    Blood in  stool:     Vomited blood:         Genitourinary    Burning when urinating:     Blood in urine:        Psychiatric    Major depression:         Hematologic    Bleeding problems:    Problems with blood clotting too easily:        Skin    Rashes or ulcers:        Constitutional    Fever or chills:      PHYSICAL EXAMINATION:  There were no vitals filed for this visit.  General:  WDWN in NAD; vital signs documented above Gait: Not observed HENT: WNL, normocephalic Pulmonary: normal non-labored breathing , without Rales, rhonchi,  wheezing Cardiac: regular HR Abdomen: soft, NT, no masses Skin: without rashes Vascular Exam/Pulses:  Right Left  Radial 2+ (normal) 2+ (normal)  Ulnar    Femoral    Popliteal    DP 2+ (normal) 2+ (normal)  PT     Extremities: without ischemic changes, without Gangrene , without cellulitis; without open wounds;  Musculoskeletal: no muscle wasting or atrophy  Neurologic: A&O X 3;  No focal weakness or paresthesias are detected Psychiatric:  The pt has Normal affect.   Non-Invasive Vascular Imaging:   Venous Reflux Times  +-----------------+---------+------+-----------+------------+--------------  ----+  RIGHT           Reflux NoRefluxReflux TimeDiameter cmsComments                                        Yes                                              +-----------------+---------+------+-----------+------------+--------------  ----+  CFV                       yes   >1 second                                  +-----------------+---------+------+-----------+------------+--------------  ----+  FV prox  yes   >1 second                                  +-----------------+---------+------+-----------+------------+--------------  ----+  FV mid           no                                                         +-----------------+---------+------+-----------+------------+--------------   ----+  FV dist          no                                                         +-----------------+---------+------+-----------+------------+--------------  ----+  Popliteal                 yes   >1 second                                  +-----------------+---------+------+-----------+------------+--------------  ----+  GSV at [facility]                 yes    >500 ms      0.88                         +-----------------+---------+------+-----------+------------+--------------  ----+  GSV prox thigh             yes    >500 ms      1.28    aneursymal           +-----------------+---------+------+-----------+------------+--------------  ----+  GSV mid thigh              yes    >500 ms      1.40                         +-----------------+---------+------+-----------+------------+--------------  ----+  GSV dist thigh             yes    >500 ms      0.78                         +-----------------+---------+------+-----------+------------+--------------  ----+  GSV at knee                yes    >500 ms      0.69                         +-----------------+---------+------+-----------+------------+--------------  ----+  GSV prox calf              yes    >500 ms      0.70    branching  varicosities         +-----------------+---------+------+-----------+------------+--------------  ----+  SSV Pop Fossa    no                            0.43                         +-----------------+---------+------+-----------+------------+--------------  ----+  SSV prox calf    no                            0.37                         +-----------------+---------+------+-----------+------------+--------------  ----+  Right calf                 yes    >500 ms      0.73                         varicose vein                                                                +-----------------+---------+------+-----------+------------+--------------  ----+     ASSESSMENT/PLAN:: 57 y.o. female presenting with symptomatic varicose veins in the right lower extremity.  These are most appreciated on the thigh.  She has a large greater saphenous vein which is readily palpable, especially after 50 pounds of weight loss.  Burman Nieves has worked hard to decrease her weight, which has helped with some of her lower extremity pain, however she continues to have asymmetric pain in the right lower extremity from her varicose veins, large greater saphenous vein.  We discussed the importance of elevation, compression in an effort to help alleviate the symptoms further.    I plan on having her follow-up with my partner Dr. Scot Dock in 3 months to discuss possible radiofrequency ablation of the greater saphenous vein, stab phlebectomy.  Maggie was sized for compression stockings while in clinic today.   Broadus John, MD [facility] [phone]

## 2022-07-02 ENCOUNTER — Ambulatory Visit (HOSPITAL_COMMUNITY)
Admission: RE | Admit: 2022-07-02 | Discharge: 2022-07-02 | Disposition: A | Discharge status: Home / Self Care | Payer: Commercial Managed Care - PPO | Source: Ambulatory Visit | Attending: Vascular Surgery | Admitting: Vascular Surgery

## 2022-07-02 ENCOUNTER — Ambulatory Visit (INDEPENDENT_AMBULATORY_CARE_PROVIDER_SITE_OTHER): Payer: Commercial Managed Care - PPO | Admitting: Vascular Surgery

## 2022-07-02 ENCOUNTER — Encounter: Payer: Self-pay | Admitting: Vascular Surgery

## 2022-07-02 VITALS — BP 132/82 | HR 70 | Temp 97.6°F | Resp 16 | Ht 67.0 in | Wt 257.0 lb

## 2022-07-02 DIAGNOSIS — I83891 Varicose veins of right lower extremities with other complications: Secondary | ICD-10-CM

## 2022-07-02 DIAGNOSIS — M79604 Pain in right leg: Secondary | ICD-10-CM | POA: Insufficient documentation

## 2022-07-02 DIAGNOSIS — I872 Venous insufficiency (chronic) (peripheral): Secondary | ICD-10-CM

## 2022-07-26 ENCOUNTER — Encounter: Payer: Commercial Managed Care - PPO | Admitting: Physical Medicine & Rehabilitation

## 2022-10-03 ENCOUNTER — Encounter: Payer: Self-pay | Admitting: Vascular Surgery

## 2022-10-03 ENCOUNTER — Ambulatory Visit (INDEPENDENT_AMBULATORY_CARE_PROVIDER_SITE_OTHER): Payer: Commercial Managed Care - PPO | Admitting: Vascular Surgery

## 2022-10-03 VITALS — BP 135/85 | HR 78 | Temp 98.0°F | Resp 18 | Ht 66.5 in | Wt 262.0 lb

## 2022-10-03 DIAGNOSIS — I83891 Varicose veins of right lower extremities with other complications: Secondary | ICD-10-CM | POA: Diagnosis not present

## 2022-10-03 NOTE — Progress Notes (Signed)
REASON FOR VISIT:   56-month follow-up visit  MEDICAL ISSUES:   PAINFUL VARICOSE VEINS RIGHT LOWER EXTREMITY: This patient is continuing to have symptoms in the right leg despite wearing her thigh-high compression stockings, elevating her legs, and exercising.  Based on her duplex I think she would be a good candidate for laser ablation of the right great saphenous vein.  I would likely address it with the laser in the proximal thigh before it exits the fascia.  Below that is the vein is quite tortuous and superficial in addition to being very large this might better be addressed with stab phlebectomies.   I have discussed the indications for endovenous laser ablation of the right GSV, that is to lower the pressure in the veins and potentially help relieve the symptoms from venous hypertension.  I have also discussed alternative options such as conservative treatment as described above. I have discussed the potential complications of the procedure, including bleeding, bruising, leg swelling, deep venous thrombosis (<1% risk), or failure of the vein to close <1% risk).  I have also explained that venous insufficiency is a chronic disease, and that the patient is at risk for recurrent varicose veins in the future.  All of the patient's questions were encouraged and answered. They are agreeable to proceed.   I have discussed with the patient the indications for stab phlebectomy.  I have explained to the patient that that will have small scars from the stab incisions.  I explained that the other risks include bruising, bleeding, and phlebitis.     HPI:   Gina Conner is a pleasant 57 y.o. female who was seen by Dr. Karin Lieu on 07/02/2022 with painful varicose veins of the right lower extremity.  She was having significant pain associated with varicosities in her right leg.  She had been losing weight and has lost 50 pounds.  She did have a previous DVT in the popliteal vein that was provoked after  Achilles surgery.  This was over 20 years ago.  When she was seen last she was encouraged to elevate her legs, was fitted for thigh-high compression stockings with a gradient of 20 to 30 mmHg, encouraged exercise.  She comes in for 27-month follow-up visit.  She is continuing to have significant aching pain and heaviness in the right leg which is aggravated by standing and relieved with elevation.  She has been wearing her thigh-high compression stockings.  She had a provoked DVT in the right popliteal vein over 20 years ago after surgery on her Achilles.  Past Medical History:  Diagnosis Date   DVT (deep venous thrombosis) (HCC)    provoked during tendon surgery was treated 3 months of coumadin   Hypothyroidism    Migraines    30 years ago     Family History  Problem Relation Age of Onset   Pulmonary embolism Mother    Migraines Mother     SOCIAL HISTORY: Social History   Tobacco Use   Smoking status: Never   Smokeless tobacco: Never  Substance Use Topics   Alcohol use: No    Comment: occ    No Known Allergies  Current Outpatient Medications  Medication Sig Dispense Refill   albuterol (VENTOLIN HFA) 108 (90 Base) MCG/ACT inhaler 1 puff as needed     ALPRAZolam (XANAX) 0.5 MG tablet Take 0.5 mg by mouth at bedtime as needed for sleep.     DULoxetine (CYMBALTA) 30 MG capsule 1 capsule  ergocalciferol (VITAMIN D2) 1.25 MG (50000 UT) capsule Vitamin D2 1,250 mcg (50,000 unit) capsule  TAKE ONE CAPSULE BY MOUTH EVERY 7 DAYS AS DIRECTED     ipratropium-albuterol (DUONEB) 0.5-2.5 (3) MG/3ML SOLN Take 3 mLs by nebulization every 6 (six) hours as needed (shortness of breath). 360 mL 3   Multiple Vitamins-Minerals (MULTI FOR HER) TABS 1 capsule     No current facility-administered medications for this visit.    REVIEW OF SYSTEMS:  [X]  denotes positive finding, [ ]  denotes negative finding Cardiac  Comments:  Chest pain or chest pressure:    Shortness of breath upon  exertion:    Short of breath when lying flat:    Irregular heart rhythm:        Vascular    Pain in calf, thigh, or hip brought on by ambulation:  x   Pain in feet at night that wakes you up from your sleep:     Blood clot in your veins:    Leg swelling:  x       Pulmonary    Oxygen at home:    Productive cough:     Wheezing:         Neurologic    Sudden weakness in arms or legs:     Sudden numbness in arms or legs:     Sudden onset of difficulty speaking or slurred speech:    Temporary loss of vision in one eye:     Problems with dizziness:         Gastrointestinal    Blood in stool:     Vomited blood:         Genitourinary    Burning when urinating:     Blood in urine:        Psychiatric    Major depression:         Hematologic    Bleeding problems:    Problems with blood clotting too easily:        Skin    Rashes or ulcers:        Constitutional    Fever or chills:     PHYSICAL EXAM:   Vitals:   10/03/22 1315  BP: 135/85  Pulse: 78  Resp: 18  Temp: 98 F (36.7 C)  TempSrc: Temporal  SpO2: 97%  Weight: 262 lb (118.8 kg)  Height: 5' 6.5" (1.689 m)    GENERAL: The patient is a well-nourished female, in no acute distress. The vital signs are documented above. CARDIAC: There is a regular rate and rhythm.  VASCULAR: She has palpable dorsalis pedis pulses. I looked at the right great saphenous vein myself with the SonoSite.  The vein exits the fascia at the junction of the proximal and mid thigh.  Below that the vein is superficial and tortuous.  I would likely cannulate the vein where it enters the fascia in the proximal thigh.  The remainder of the vein could be addressed with stab phlebectomy PULMONARY: There is good air exchange bilaterally without wheezing or rales. ABDOMEN: Soft and non-tender with normal pitched bowel sounds.  MUSCULOSKELETAL: There are no major deformities or cyanosis. NEUROLOGIC: No focal weakness or paresthesias are  detected. SKIN: There are no ulcers or rashes noted. PSYCHIATRIC: The patient has a normal affect.  DATA:    VENOUS DUPLEX: I reviewed her venous duplex scan that was done on 07/02/2022.  This was of the right lower extremity only.  There was no evidence of DVT.  There was deep  venous reflux involving the common femoral vein, femoral vein, and popliteal vein.  There was significant superficial venous reflux in the right great saphenous vein from the saphenofemoral junction to the proximal calf.  Diameters of the vein ranged from 7-14 mm.  The results of the study are summarized in the diagram below.    Waverly Ferrari [facility] [phone]

## 2022-10-16 ENCOUNTER — Other Ambulatory Visit: Payer: Self-pay | Admitting: *Deleted

## 2022-10-16 DIAGNOSIS — I83811 Varicose veins of right lower extremities with pain: Secondary | ICD-10-CM

## 2022-10-30 ENCOUNTER — Other Ambulatory Visit: Payer: Self-pay | Admitting: *Deleted

## 2022-10-30 MED ORDER — LORAZEPAM 1 MG PO TABS: ORAL_TABLET | ORAL | 0 refills | Status: AC

## 2022-11-07 ENCOUNTER — Encounter: Payer: Self-pay | Admitting: Vascular Surgery

## 2022-11-07 ENCOUNTER — Ambulatory Visit (INDEPENDENT_AMBULATORY_CARE_PROVIDER_SITE_OTHER): Payer: Commercial Managed Care - PPO | Admitting: Vascular Surgery

## 2022-11-07 VITALS — BP 127/86 | HR 72 | Temp 97.9°F | Resp 16 | Ht 67.0 in | Wt 206.0 lb

## 2022-11-07 DIAGNOSIS — I83811 Varicose veins of right lower extremities with pain: Secondary | ICD-10-CM | POA: Diagnosis not present

## 2022-11-07 NOTE — Progress Notes (Unsigned)
     Laser Ablation Procedure    Date: 11/07/2022   Gina Conner DOB:[birth year]  Consent signed: Yes      Surgeon:Dr. Waverly Ferrari   Procedure: Laser Ablation: right Greater Saphenous Vein  BP 127/86 (BP Location: Left Arm, Patient Position: Sitting, Cuff Size: Normal)   Pulse 72   Temp 97.9 F (36.6 C) (Temporal)   Resp 16   Ht 5\' 7"  (1.702 m)   Wt 206 lb (93.4 kg)   SpO2 98%   BMI 32.26 kg/m   Tumescent Anesthesia: 550 cc 0.9% NaCl with 50 cc Lidocaine HCL 1%  and 15 cc 8.4% NaHCO3  Local Anesthesia: 20 cc Lidocaine HCL and NaHCO3 (ratio 2:1)  7 watts continuous mode     Total energy: 468.4 Joules    Total time: 66 Seconds     Treatment Length 11  Laser Fiber Ref. # [id number]        Lot # [id number]   Stab Phlebectomy: 10-20 Sites: Thigh, Calf, and Ankle  Patient tolerated procedure well  Notes: All staff members wore facial masks.   Patient has taken Ativan prior to procedure 2 Tablets/ 1 mg at 07:15 Am.   Description of Procedure:  After marking the course of the secondary varicosities, the patient was placed on the operating table in the supine position, and the right leg was prepped and draped in sterile fashion.   Local anesthetic was administered and under ultrasound guidance the saphenous vein was accessed with a micro needle and guide wire; then the mirco puncture sheath was placed.  A guide wire was inserted saphenofemoral junction , followed by a 5 french sheath.  The position of the sheath and then the laser fiber below the junction was confirmed using the ultrasound.  Tumescent anesthesia was administered along the course of the saphenous vein using ultrasound guidance. The patient was placed in Trendelenburg position and protective laser glasses were placed on patient and staff, and the laser was fired at 7 watts continuous mode for a total of 468.4 joules.   For stab phlebectomies, local anesthetic was administered at the previously marked  varicosities, and tumescent anesthesia was administered around the vessels.  Ten to 20 stab wounds were made using the tip of an 11 blade. And using the vein hook, the phlebectomies were performed using a hemostat to avulse the varicosities.  Adequate hemostasis was achieved.     Steri strips were applied to the stab wounds and ABD pads and thigh high compression stockings were applied.  Ace wrap bandages were applied over the phlebectomy sites and at the top of the saphenofemoral junction. Blood loss was less than 15 cc.  Discharge instructions reviewed with patient and hardcopy of discharge instructions given to patient to take home. The patient ambulated out of the operating room having tolerated the procedure well.

## 2022-11-07 NOTE — Progress Notes (Unsigned)
Patient name: Gina Conner MRN: [id number] DOB: 10-03-1965 Sex: female  REASON FOR VISIT:   HPI: Gina Conner is a 57 y.o. female for laser ablation of the right great saphenous vein with 10-20 stabs.  She had painful varicose veins of the right lower extremity and had failed conservative treatment.  I felt she was a candidate for laser ablation of the right great saphenous vein.  I felt that I would likely address the vein in the proximal thigh before it exited the fascia.  Below that the vein became quite tortuous and was superficial I thought we could get some of the vein that was superficial using stab phlebectomies.   Current Outpatient Medications  Medication Sig Dispense Refill   LORazepam (ATIVAN) 1 MG tablet Take 2 tablets prior to leaving house on day of office surgery.  Bring third tablet with you to office on day of office surgery. 3 tablet 0   albuterol (VENTOLIN HFA) 108 (90 Base) MCG/ACT inhaler 1 puff as needed     ALPRAZolam (XANAX) 0.5 MG tablet Take 0.5 mg by mouth at bedtime as needed for sleep.     DULoxetine (CYMBALTA) 30 MG capsule 1 capsule     ergocalciferol (VITAMIN D2) 1.25 MG (50000 UT) capsule Vitamin D2 1,250 mcg (50,000 unit) capsule  TAKE ONE CAPSULE BY MOUTH EVERY 7 DAYS AS DIRECTED     ipratropium-albuterol (DUONEB) 0.5-2.5 (3) MG/3ML SOLN Take 3 mLs by nebulization every 6 (six) hours as needed (shortness of breath). 360 mL 3   Multiple Vitamins-Minerals (MULTI FOR HER) TABS 1 capsule     No current facility-administered medications for this visit.    PHYSICAL EXAM: Vitals:   11/07/22 0907  BP: 127/86  Pulse: 72  Resp: 16  Temp: 97.9 F (36.6 C)  TempSrc: Temporal  SpO2: 98%  Weight: 206 lb (93.4 kg)  Height: 5\' 7"  (1.702 m)    PROCEDURE: Laser Ablation left great saphenous vein and 10-20 stabs  TECHNIQUE: The patient was taken to the exam room and the dilated veins were marked with the patient standing.  The patient was then placed  supine.  I looked at the great saphenous vein I felt that I could cannulate this in the proximal thigh before it exited the fascia.  The left leg was prepped and draped in the usual sterile fashion.  Under ultrasound guidance, after the skin was anesthetized, I cannulated the great saphenous vein with a micropuncture needle and a micropuncture sheath was introduced over the wire.  I then advanced the J-wire to just below the saphenofemoral junction and advanced the sheath over the wire.  Laser fiber was positioned at the end of the sheath and the sheath retracted.  The laser fiber was positioned approximately 2.5 cm distal to the saphenofemoral junction.  Tumescent anesthesia was then administered circumferentially around the vein.  Laser ablation was then performed of the left great saphenous vein in the proximal thigh.  50 J/cm was used at 7 W.  Next attention was turned to stab phlebectomies.  All the marked areas were anesthetized with tumescent anesthesia.  Using approximately 15-20 small stab incisions the veins were "brought above the skin and then bluntly excised.  2 large segments were removed en bloc.    Steri-Strips were then placed over the wounds and a pressure dressing was applied.  The patient tolerated the procedure well.  She will return in 1 week for a follow-up duplex scan.  Waverly Ferrari [facility] [phone]

## 2022-11-14 ENCOUNTER — Ambulatory Visit (INDEPENDENT_AMBULATORY_CARE_PROVIDER_SITE_OTHER): Payer: Commercial Managed Care - PPO | Admitting: Vascular Surgery

## 2022-11-14 ENCOUNTER — Ambulatory Visit (HOSPITAL_COMMUNITY)
Admission: RE | Admit: 2022-11-14 | Discharge: 2022-11-14 | Disposition: A | Discharge status: Home / Self Care | Payer: Commercial Managed Care - PPO | Source: Ambulatory Visit | Attending: Vascular Surgery | Admitting: Vascular Surgery

## 2022-11-14 ENCOUNTER — Encounter: Payer: Self-pay | Admitting: Vascular Surgery

## 2022-11-14 VITALS — BP 134/90 | HR 80 | Temp 97.8°F | Resp 16 | Ht 68.0 in | Wt 260.0 lb

## 2022-11-14 DIAGNOSIS — I83811 Varicose veins of right lower extremities with pain: Secondary | ICD-10-CM | POA: Diagnosis present

## 2022-11-14 DIAGNOSIS — I83891 Varicose veins of right lower extremities with other complications: Secondary | ICD-10-CM

## 2022-11-14 NOTE — Progress Notes (Signed)
   Patient name: Gina Conner MRN: [id number] DOB: 1965-10-16 Sex: female  REASON FOR VISIT: Follow-up after laser ablation of the right great saphenous vein in 10-20 stabs  HPI: Gina Conner is a 57 y.o. female with painful varicose veins of the right lower extremity.  She failed conservative treatment.  On 11/07/2022 she underwent laser ablation of the right great saphenous vein with 10-20 stabs.  She comes in for a 1 week follow-up visit.  Since I saw her last week she states she has been doing well and has been wearing her thigh-high stockings.  She has been ambulating and elevating her legs daily.  She has no chest pain or shortness of breath.  Current Outpatient Medications  Medication Sig Dispense Refill   LORazepam (ATIVAN) 1 MG tablet Take 2 tablets prior to leaving house on day of office surgery.  Bring third tablet with you to office on day of office surgery. 3 tablet 0   albuterol (VENTOLIN HFA) 108 (90 Base) MCG/ACT inhaler 1 puff as needed     ALPRAZolam (XANAX) 0.5 MG tablet Take 0.5 mg by mouth at bedtime as needed for sleep.     DULoxetine (CYMBALTA) 30 MG capsule 1 capsule     ergocalciferol (VITAMIN D2) 1.25 MG (50000 UT) capsule Vitamin D2 1,250 mcg (50,000 unit) capsule  TAKE ONE CAPSULE BY MOUTH EVERY 7 DAYS AS DIRECTED     ipratropium-albuterol (DUONEB) 0.5-2.5 (3) MG/3ML SOLN Take 3 mLs by nebulization every 6 (six) hours as needed (shortness of breath). 360 mL 3   Multiple Vitamins-Minerals (MULTI FOR HER) TABS 1 capsule     No current facility-administered medications for this visit.   REVIEW OF SYSTEMS: Arly.Keller ] denotes positive finding; [  ] denotes negative finding  CARDIOVASCULAR:  [ ]  chest pain   [ ]  dyspnea on exertion  [ ]  leg swelling  CONSTITUTIONAL:  [ ]  fever   [ ]  chills  PHYSICAL EXAM: Vitals:   11/14/22 1032  BP: (!) 134/90  Pulse: 80  Resp: 16  Temp: 97.8 F (36.6 C)  TempSrc: Temporal  Weight: 260 lb (117.9 kg)  Height: 5\' 8"  (1.727  m)   GENERAL: The patient is a well-nourished female, in no acute distress. The vital signs are documented above. CARDIOVASCULAR: There is a regular rate and rhythm. PULMONARY: There is good air exchange bilaterally without wheezing or rales. VASCULAR: She has some mild bruising in her medial thigh.  Her stab incisions are healing nicely.  DATA:  VENOUS DUPLEX: I have independently interpreted her venous duplex scan today.  This was of the right lower extremity.  There is no evidence of DVT.  The right great saphenous vein is successfully closed to within 2.98 centimeters of the saphenofemoral junction.   MEDICAL ISSUES:  S/P LASER ABLATION RIGHT GREAT SAPHENOUS VEIN WITH 10-20 STABS: Patient is doing well status post laser ablation of the right great saphenous vein and stab phlebectomies.  She has 1 tender area in the proximal thigh which likely represents some mild phlebitis or small hematoma.  She will continue to elevate her legs and wear her compression stockings for another week.  Currently her symptoms on the left leg are mild.  She will call if the symptoms progress.  I will see her back as needed.  Waverly Ferrari [facility] [phone]
